# Patient Record
Sex: Male | Born: 1935 | Race: White | Hispanic: No | Marital: Married | Smoking: Never smoker
Health system: Southern US, Community
[De-identification: ages and names within clinical notes are randomized; demographics above are authoritative.]

## PROBLEM LIST (undated history)

## (undated) DIAGNOSIS — J310 Chronic rhinitis: Secondary | ICD-10-CM

## (undated) DIAGNOSIS — R3915 Urgency of urination: Secondary | ICD-10-CM

## (undated) DIAGNOSIS — I639 Cerebral infarction, unspecified: Secondary | ICD-10-CM

## (undated) DIAGNOSIS — N401 Enlarged prostate with lower urinary tract symptoms: Secondary | ICD-10-CM

## (undated) DIAGNOSIS — I10 Essential (primary) hypertension: Secondary | ICD-10-CM

## (undated) DIAGNOSIS — I480 Paroxysmal atrial fibrillation: Secondary | ICD-10-CM

## (undated) DIAGNOSIS — M25569 Pain in unspecified knee: Secondary | ICD-10-CM

## (undated) DIAGNOSIS — G20 Parkinson's disease: Secondary | ICD-10-CM

## (undated) DIAGNOSIS — M5136 Other intervertebral disc degeneration, lumbar region: Secondary | ICD-10-CM

## (undated) HISTORY — PX: HERNIA REPAIR: SHX51

---

## 1999-06-09 ENCOUNTER — Ambulatory Visit: Admission: RE | Admit: 1999-06-09 | Discharge: 1999-06-09 | Payer: Self-pay

## 2016-08-08 ENCOUNTER — Encounter (HOSPITAL_COMMUNITY): Payer: Self-pay

## 2016-08-08 ENCOUNTER — Emergency Department (HOSPITAL_COMMUNITY): Payer: Medicare Other

## 2016-08-08 ENCOUNTER — Inpatient Hospital Stay (HOSPITAL_COMMUNITY)
Admission: EM | Admit: 2016-08-08 | Discharge: 2016-08-12 | DRG: 064 | Disposition: A | Discharge status: Rehabilitation Facility | Payer: Medicare Other | Attending: Neurology | Admitting: Neurology

## 2016-08-08 DIAGNOSIS — R410 Disorientation, unspecified: Secondary | ICD-10-CM | POA: Diagnosis not present

## 2016-08-08 DIAGNOSIS — D72829 Elevated white blood cell count, unspecified: Secondary | ICD-10-CM | POA: Diagnosis present

## 2016-08-08 DIAGNOSIS — I161 Hypertensive emergency: Secondary | ICD-10-CM | POA: Diagnosis present

## 2016-08-08 DIAGNOSIS — B9620 Unspecified Escherichia coli [E. coli] as the cause of diseases classified elsewhere: Secondary | ICD-10-CM | POA: Diagnosis not present

## 2016-08-08 DIAGNOSIS — G8194 Hemiplegia, unspecified affecting left nondominant side: Secondary | ICD-10-CM | POA: Diagnosis present

## 2016-08-08 DIAGNOSIS — R29810 Facial weakness: Secondary | ICD-10-CM | POA: Diagnosis present

## 2016-08-08 DIAGNOSIS — I351 Nonrheumatic aortic (valve) insufficiency: Secondary | ICD-10-CM | POA: Diagnosis not present

## 2016-08-08 DIAGNOSIS — I69122 Dysarthria following nontraumatic intracerebral hemorrhage: Secondary | ICD-10-CM | POA: Diagnosis not present

## 2016-08-08 DIAGNOSIS — Z8673 Personal history of transient ischemic attack (TIA), and cerebral infarction without residual deficits: Secondary | ICD-10-CM | POA: Diagnosis not present

## 2016-08-08 DIAGNOSIS — Z23 Encounter for immunization: Secondary | ICD-10-CM

## 2016-08-08 DIAGNOSIS — M1710 Unilateral primary osteoarthritis, unspecified knee: Secondary | ICD-10-CM | POA: Diagnosis present

## 2016-08-08 DIAGNOSIS — M179 Osteoarthritis of knee, unspecified: Secondary | ICD-10-CM | POA: Diagnosis not present

## 2016-08-08 DIAGNOSIS — I480 Paroxysmal atrial fibrillation: Secondary | ICD-10-CM | POA: Diagnosis present

## 2016-08-08 DIAGNOSIS — I619 Nontraumatic intracerebral hemorrhage, unspecified: Secondary | ICD-10-CM | POA: Diagnosis not present

## 2016-08-08 DIAGNOSIS — R29703 NIHSS score 3: Secondary | ICD-10-CM | POA: Diagnosis present

## 2016-08-08 DIAGNOSIS — I7389 Other specified peripheral vascular diseases: Secondary | ICD-10-CM | POA: Diagnosis present

## 2016-08-08 DIAGNOSIS — I10 Essential (primary) hypertension: Secondary | ICD-10-CM | POA: Diagnosis not present

## 2016-08-08 DIAGNOSIS — G20 Parkinson's disease: Secondary | ICD-10-CM | POA: Diagnosis present

## 2016-08-08 DIAGNOSIS — R339 Retention of urine, unspecified: Secondary | ICD-10-CM | POA: Diagnosis not present

## 2016-08-08 DIAGNOSIS — D72825 Bandemia: Secondary | ICD-10-CM | POA: Diagnosis not present

## 2016-08-08 DIAGNOSIS — N179 Acute kidney failure, unspecified: Secondary | ICD-10-CM | POA: Diagnosis present

## 2016-08-08 DIAGNOSIS — I639 Cerebral infarction, unspecified: Secondary | ICD-10-CM | POA: Diagnosis present

## 2016-08-08 DIAGNOSIS — N189 Chronic kidney disease, unspecified: Secondary | ICD-10-CM | POA: Diagnosis not present

## 2016-08-08 DIAGNOSIS — R7989 Other specified abnormal findings of blood chemistry: Secondary | ICD-10-CM | POA: Diagnosis not present

## 2016-08-08 DIAGNOSIS — Z79899 Other long term (current) drug therapy: Secondary | ICD-10-CM

## 2016-08-08 DIAGNOSIS — I69119 Unspecified symptoms and signs involving cognitive functions following nontraumatic intracerebral hemorrhage: Secondary | ICD-10-CM | POA: Diagnosis not present

## 2016-08-08 DIAGNOSIS — I613 Nontraumatic intracerebral hemorrhage in brain stem: Principal | ICD-10-CM | POA: Diagnosis present

## 2016-08-08 DIAGNOSIS — Z800 Family history of malignant neoplasm of digestive organs: Secondary | ICD-10-CM | POA: Diagnosis not present

## 2016-08-08 DIAGNOSIS — I69398 Other sequelae of cerebral infarction: Secondary | ICD-10-CM | POA: Diagnosis not present

## 2016-08-08 DIAGNOSIS — N401 Enlarged prostate with lower urinary tract symptoms: Secondary | ICD-10-CM | POA: Diagnosis not present

## 2016-08-08 DIAGNOSIS — N183 Chronic kidney disease, stage 3 (moderate): Secondary | ICD-10-CM | POA: Diagnosis present

## 2016-08-08 DIAGNOSIS — R269 Unspecified abnormalities of gait and mobility: Secondary | ICD-10-CM | POA: Diagnosis not present

## 2016-08-08 DIAGNOSIS — H510 Palsy (spasm) of conjugate gaze: Secondary | ICD-10-CM | POA: Diagnosis present

## 2016-08-08 DIAGNOSIS — Z7982 Long term (current) use of aspirin: Secondary | ICD-10-CM

## 2016-08-08 DIAGNOSIS — N390 Urinary tract infection, site not specified: Secondary | ICD-10-CM | POA: Diagnosis not present

## 2016-08-08 DIAGNOSIS — E785 Hyperlipidemia, unspecified: Secondary | ICD-10-CM | POA: Diagnosis present

## 2016-08-08 DIAGNOSIS — I129 Hypertensive chronic kidney disease with stage 1 through stage 4 chronic kidney disease, or unspecified chronic kidney disease: Secondary | ICD-10-CM | POA: Diagnosis present

## 2016-08-08 DIAGNOSIS — I69391 Dysphagia following cerebral infarction: Secondary | ICD-10-CM | POA: Diagnosis not present

## 2016-08-08 DIAGNOSIS — Z87891 Personal history of nicotine dependence: Secondary | ICD-10-CM

## 2016-08-08 DIAGNOSIS — M1711 Unilateral primary osteoarthritis, right knee: Secondary | ICD-10-CM | POA: Diagnosis not present

## 2016-08-08 DIAGNOSIS — I69152 Hemiplegia and hemiparesis following nontraumatic intracerebral hemorrhage affecting left dominant side: Secondary | ICD-10-CM | POA: Diagnosis present

## 2016-08-08 DIAGNOSIS — R3916 Straining to void: Secondary | ICD-10-CM | POA: Diagnosis not present

## 2016-08-08 DIAGNOSIS — G9389 Other specified disorders of brain: Secondary | ICD-10-CM | POA: Diagnosis present

## 2016-08-08 DIAGNOSIS — R471 Dysarthria and anarthria: Secondary | ICD-10-CM | POA: Diagnosis present

## 2016-08-08 DIAGNOSIS — L309 Dermatitis, unspecified: Secondary | ICD-10-CM | POA: Diagnosis not present

## 2016-08-08 DIAGNOSIS — E876 Hypokalemia: Secondary | ICD-10-CM | POA: Diagnosis not present

## 2016-08-08 DIAGNOSIS — A499 Bacterial infection, unspecified: Secondary | ICD-10-CM | POA: Diagnosis not present

## 2016-08-08 DIAGNOSIS — N3000 Acute cystitis without hematuria: Secondary | ICD-10-CM | POA: Diagnosis not present

## 2016-08-08 DIAGNOSIS — H4921 Sixth [abducent] nerve palsy, right eye: Secondary | ICD-10-CM | POA: Diagnosis not present

## 2016-08-08 HISTORY — DX: Paroxysmal atrial fibrillation: I48.0

## 2016-08-08 HISTORY — DX: Pain in unspecified knee: M25.569

## 2016-08-08 HISTORY — DX: Other intervertebral disc degeneration, lumbar region: M51.36

## 2016-08-08 HISTORY — DX: Chronic rhinitis: J31.0

## 2016-08-08 HISTORY — DX: Cerebral infarction, unspecified: I63.9

## 2016-08-08 HISTORY — DX: Benign prostatic hyperplasia with lower urinary tract symptoms: N40.1

## 2016-08-08 HISTORY — DX: Urgency of urination: R39.15

## 2016-08-08 HISTORY — DX: Parkinson's disease: G20

## 2016-08-08 HISTORY — DX: Essential (primary) hypertension: I10

## 2016-08-08 LAB — URINALYSIS, ROUTINE W REFLEX MICROSCOPIC
Bilirubin Urine: NEGATIVE
Glucose, UA: NEGATIVE mg/dL
HGB URINE DIPSTICK: NEGATIVE
Ketones, ur: NEGATIVE mg/dL
LEUKOCYTES UA: NEGATIVE
Nitrite: NEGATIVE
PROTEIN: NEGATIVE mg/dL
Specific Gravity, Urine: 1.016 (ref 1.005–1.030)
pH: 6 (ref 5.0–8.0)

## 2016-08-08 LAB — DIFFERENTIAL
BASOS ABS: 0 10*3/uL (ref 0.0–0.1)
Basophils Relative: 0 %
Eosinophils Absolute: 0.1 10*3/uL (ref 0.0–0.7)
Eosinophils Relative: 0 %
LYMPHS ABS: 1.3 10*3/uL (ref 0.7–4.0)
Lymphocytes Relative: 9 %
MONOS PCT: 5 %
Monocytes Absolute: 0.8 10*3/uL (ref 0.1–1.0)
NEUTROS ABS: 12.6 10*3/uL — AB (ref 1.7–7.7)
Neutrophils Relative %: 86 %

## 2016-08-08 LAB — COMPREHENSIVE METABOLIC PANEL
ALT: 16 U/L — AB (ref 17–63)
AST: 28 U/L (ref 15–41)
Albumin: 3.9 g/dL (ref 3.5–5.0)
Alkaline Phosphatase: 46 U/L (ref 38–126)
Anion gap: 7 (ref 5–15)
BILIRUBIN TOTAL: 0.6 mg/dL (ref 0.3–1.2)
BUN: 23 mg/dL — ABNORMAL HIGH (ref 6–20)
CALCIUM: 9.5 mg/dL (ref 8.9–10.3)
CO2: 27 mmol/L (ref 22–32)
CREATININE: 1.63 mg/dL — AB (ref 0.61–1.24)
Chloride: 104 mmol/L (ref 101–111)
GFR calc Af Amer: 43 mL/min — ABNORMAL LOW (ref >60)
GFR calc non Af Amer: 37 mL/min — ABNORMAL LOW (ref >60)
Glucose, Bld: 124 mg/dL — ABNORMAL HIGH (ref 65–99)
Potassium: 3.9 mmol/L (ref 3.5–5.1)
Sodium: 138 mmol/L (ref 135–145)
TOTAL PROTEIN: 7.2 g/dL (ref 6.5–8.1)

## 2016-08-08 LAB — I-STAT CHEM 8, ED
BUN: 27 mg/dL — ABNORMAL HIGH (ref 6–20)
CALCIUM ION: 1.1 mmol/L — AB (ref 1.15–1.40)
CREATININE: 1.7 mg/dL — AB (ref 0.61–1.24)
Chloride: 102 mmol/L (ref 101–111)
GLUCOSE: 119 mg/dL — AB (ref 65–99)
HCT: 44 % (ref 39.0–52.0)
Hemoglobin: 15 g/dL (ref 13.0–17.0)
POTASSIUM: 3.8 mmol/L (ref 3.5–5.1)
Sodium: 140 mmol/L (ref 135–145)
TCO2: 27 mmol/L (ref 0–100)

## 2016-08-08 LAB — I-STAT TROPONIN, ED: TROPONIN I, POC: 0 ng/mL (ref 0.00–0.08)

## 2016-08-08 LAB — RAPID URINE DRUG SCREEN, HOSP PERFORMED
Amphetamines: NOT DETECTED
BARBITURATES: NOT DETECTED
Benzodiazepines: NOT DETECTED
Cocaine: NOT DETECTED
Opiates: NOT DETECTED
Tetrahydrocannabinol: NOT DETECTED

## 2016-08-08 LAB — ETHANOL: Alcohol, Ethyl (B): <5 mg/dL (ref <5)

## 2016-08-08 LAB — CBC
HEMATOCRIT: 43.4 % (ref 39.0–52.0)
HEMOGLOBIN: 15 g/dL (ref 13.0–17.0)
MCH: 32.1 pg (ref 26.0–34.0)
MCHC: 34.6 g/dL (ref 30.0–36.0)
MCV: 92.7 fL (ref 78.0–100.0)
Platelets: 173 10*3/uL (ref 150–400)
RBC: 4.68 MIL/uL (ref 4.22–5.81)
RDW: 14.2 % (ref 11.5–15.5)
WBC: 14.8 10*3/uL — ABNORMAL HIGH (ref 4.0–10.5)

## 2016-08-08 LAB — PROTIME-INR
INR: 1.11
Prothrombin Time: 14.3 seconds (ref 11.4–15.2)

## 2016-08-08 LAB — APTT: APTT: 29 s (ref 24–36)

## 2016-08-08 MED ORDER — STROKE: EARLY STAGES OF RECOVERY BOOK
Freq: Once | Status: AC
  Administered 2016-08-08: 22:00:00
  Filled 2016-08-08: qty 1

## 2016-08-08 MED ORDER — LABETALOL HCL 5 MG/ML IV SOLN
INTRAVENOUS | Status: AC
  Filled 2016-08-08: qty 4

## 2016-08-08 MED ORDER — SENNOSIDES-DOCUSATE SODIUM 8.6-50 MG PO TABS
1.0000 | ORAL_TABLET | Freq: Two times a day (BID) | ORAL | Status: DC
  Administered 2016-08-08 – 2016-08-12 (×7): 1 via ORAL
  Filled 2016-08-08 (×7): qty 1

## 2016-08-08 MED ORDER — LABETALOL HCL 5 MG/ML IV SOLN
10.0000 mg | Freq: Once | INTRAVENOUS | Status: AC
  Administered 2016-08-08: 10 mg via INTRAVENOUS

## 2016-08-08 MED ORDER — NICARDIPINE HCL IN NACL 20-0.86 MG/200ML-% IV SOLN
3.0000 mg/h | INTRAVENOUS | Status: DC
Start: 2016-08-08 — End: 2016-08-09
  Administered 2016-08-08 (×2): 5 mg/h via INTRAVENOUS
  Administered 2016-08-08: 3 mg/h via INTRAVENOUS
  Filled 2016-08-08 (×2): qty 200

## 2016-08-08 MED ORDER — ACETAMINOPHEN 160 MG/5ML PO SOLN: 650.0000 mg | ORAL | Status: DC | PRN

## 2016-08-08 MED ORDER — PANTOPRAZOLE SODIUM 40 MG IV SOLR
40.0000 mg | Freq: Every day | INTRAVENOUS | Status: DC
  Administered 2016-08-08 – 2016-08-09 (×2): 40 mg via INTRAVENOUS
  Filled 2016-08-08 (×2): qty 40

## 2016-08-08 MED ORDER — SODIUM CHLORIDE 0.9 % IV SOLN
INTRAVENOUS | Status: DC
  Administered 2016-08-08 – 2016-08-10 (×3): via INTRAVENOUS

## 2016-08-08 MED ORDER — ACETAMINOPHEN 650 MG RE SUPP: 650.0000 mg | RECTAL | Status: DC | PRN

## 2016-08-08 MED ORDER — ACETAMINOPHEN 325 MG PO TABS
650.0000 mg | ORAL_TABLET | ORAL | Status: DC | PRN
  Administered 2016-08-12: 650 mg via ORAL
  Filled 2016-08-08: qty 2

## 2016-08-08 NOTE — ED Notes (Signed)
Placed patient on the monitor did ekg shown to Dr Clayborne DanaMesner patient is now gone to the CT scan with nurse

## 2016-08-08 NOTE — Code Documentation (Signed)
83 y.o. 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 Pt awoke this morning in his usual state of health. His wife states she saw him normal at 1500. He then went into the bathroom and shut the door. His wife was concerned and went to check on him around 1520. The wife states once she arrived the patient asked for a walker and noted the pt to have a left sided gaze and was leaning to the left. [facility]. Code stroke called by EDP upon pt arrival. Pt taken to CT and labs drawn. CT showing acute pontine hemorrhage. Pt brought back to ED. 10mg  labetalol given and then Cardene gtt started for BP control. NIHSS 3. See EMR for code stroke times and NIHSS. On assessment pt with left gaze deviation and mild facial droop. tPA / IR not considered d/t hemorrhage. Bedside handoff with ED RN Amil AmenJulia

## 2016-08-08 NOTE — ED Notes (Addendum)
Diet order obtained from Lindzen, MD. Pt given sandwich meal and black coffee, per request.

## 2016-08-08 NOTE — ED Triage Notes (Signed)
Pt presents with sudden onset of weakness, unable to ambulate, noted with L sided gaze with wife reporting "leaning to the left".

## 2016-08-08 NOTE — H&P (Signed)
Neurology H&P  CC: Gaze palsy  History is obtained from:patient, wife  HPI: Para Skeanslbert Cameron is a 83 y.o. male last seen well around 3:20pm. We developed a left gaze deviation. Due to this continuing, he presented to the emergency room. He states that his left side feels slightly weaker than typical, but otherwise no associated symptoms.  Of note, he has a history of and possible hemorrhage in the past.  LKW: 3:20 PM tpa given?: no, ICH ICH Score: 2  ROS: A 14 point ROS was performed and is negative except as noted in the HPI.    Past Medical History:  Diagnosis Date  . Hypertension   . Stroke [facility](HCC)      History reviewed. No pertinent family history.   Social History:  reports that he has never smoked. He has quit using smokeless tobacco. His alcohol and drug histories are not on file.   Exam: Current vital signs: BP 170/82   Pulse 75   Resp 16   Ht 5\' 10"  (1.778 m)   Wt 85.3 kg (188 lb)   SpO2 98%   BMI 26.98 kg/m  Vital signs in last 24 hours: Pulse Rate:  [75] 75 (03/01 1710) Resp:  [16] 16 (03/01 1710) BP: (170)/(82) 170/82 (03/01 1710) SpO2:  [98 %] 98 % (03/01 1710) Weight:  [85.3 kg (188 lb)] 85.3 kg (188 lb) (03/01 1710)  Physical Exam  Constitutional: Appears well-developed and well-nourished.  Psych: Affect appropriate to situation Eyes: No scleral injection HENT: No OP obstrucion Head: Normocephalic.  Cardiovascular: Normal rate and regular rhythm.  Respiratory: Effort normal and breath sounds normal to anterior ascultation GI: Soft.  No distension. There is no tenderness.  Skin: WDI  Neuro: Mental Status: Patient is awake, alert, oriented to person, place, month, year, and situation. Patient is able to give a clear and coherent history. No signs of aphasia or neglect Cranial Nerves: II: Visual Fields are full. Pupils are equal, round, and reactive to light.   III,IV, VI: he has a left gaze deviation.  V: Facial sensation is symmetric to  temperature VII: Facial movement is mild left droop VIII: hearing is intact to voice X: Uvula elevates symmetrically XI: Shoulder shrug is symmetric. XII: tongue is midline without atrophy or fasciculations.  Motor: Tone is normal. Bulk is normal. 5/5 strength was present on the right, he has a very mild 5-/5 hemiparesis.  Sensory: Sensation is symmetric to light touch and temperature in the arms and legs. Cerebellar: FNF intact bilaterally   I have reviewed labs in epic and the results pertinent to this consultation are: Creatinine mildly elevated  I have reviewed the images obtained:CT head- small pontine hemorrhage.   Impression: 83 yo M With small pontine hemorrhage. Suspect hypertensive in etiology.   Recommendations: 1) Admit to ICU 2) no antiplatelets or anticoagulants 3) blood pressure control with goal systolic 120 - 140 4) Frequent neuro checks 5) If symptoms worsen or there is decreased mental status, repeat stat head CT 6) PT,OT,ST 7) Hydration for possible AKI 8) Nicardipine for BP control.     This patient is critically ill and at significant risk of neurological worsening, death and care requires constant monitoring of vital signs, hemodynamics,respiratory and cardiac monitoring, neurological assessment, discussion with family, other specialists and medical decision making of high complexity. I spent 35 minutes of neurocritical care time  in the care of  this patient.  Ritta SlotMcNeill Ariq Khamis, MD [facility] [phone]  If 7pm- 7am, please page neurology on  call as listed in AMION. 08/08/2016  5:26 PM

## 2016-08-08 NOTE — ED Provider Notes (Signed)
[facility]-EMERGENCY DEPT Provider Note   CSN: [id number] Arrival date & time: 08/08/16  1646     History   Chief Complaint Chief Complaint  Patient presents with  . Code Stroke    HPI Gabino Hagin is a 83 y.o. male.   Neurologic Problem  This is a new problem. The current episode started 1 to 2 hours ago. The problem occurs constantly. The problem has not changed since onset.Pertinent negatives include no chest pain. Nothing aggravates the symptoms. Nothing relieves the symptoms. He has tried nothing for the symptoms.    Past Medical History:  Diagnosis Date  . Hypertension   . Stroke [facility]     Patient Active Problem List   Diagnosis Date Noted  . Stroke (cerebrum) (HCC) 08/08/2016    History reviewed. No pertinent surgical history.     Home Medications    Prior to Admission medications   Medication Sig Start Date End Date Taking? Authorizing Provider  aspirin EC 325 MG tablet Take 325 mg by mouth daily.   Yes Historical Provider, MD  bisoprolol-hydrochlorothiazide (ZIAC) 5-6.25 MG tablet Take 1 tablet by mouth daily.   Yes Historical Provider, MD  docusate sodium (COLACE) 100 MG capsule Take 200 mg by mouth 2 (two) times daily.   Yes Historical Provider, MD    Family History History reviewed. No pertinent family history.  Social History Social History  Substance Use Topics  . Smoking status: Never Smoker  . Smokeless tobacco: Former Neurosurgeon  . Alcohol use Not on file     Allergies   Patient has no known allergies.   Review of Systems Review of Systems  Cardiovascular: Negative for chest pain.  Neurological: Positive for facial asymmetry (left eye deviation).  All other systems reviewed and are negative.    Physical Exam Updated Vital Signs BP (!) 122/54   Pulse 76   Temp 97.9 F (36.6 C)   Resp 17   Ht 5\' 10"  (1.778 m)   Wt 188 lb (85.3 kg)   SpO2 96%   BMI 26.98 kg/m   Physical Exam  Constitutional: He is oriented to person, place, and  time. He appears well-developed and well-nourished.  HENT:  Head: Normocephalic and atraumatic.  Eyes: Conjunctivae are normal.  Neck: Normal range of motion.  Cardiovascular: Normal rate.   Pulmonary/Chest: Effort normal. No respiratory distress.  Abdominal: Soft. He exhibits no distension.  Musculoskeletal: Normal range of motion.  Neurological: He is alert and oriented to person, place, and time. Coordination normal.  Persistent left gaze deviation and right neglect A&O X 4.  Able to move all extremities normally with normal sensation in all extremities.  Skin: Skin is warm and dry.  Nursing note and vitals reviewed.    ED Treatments / Results  Labs (all labs ordered are listed, but only abnormal results are displayed) Labs Reviewed  CBC - Abnormal; Notable for the following:       Result Value   WBC 14.8 (*)    All other components within normal limits  DIFFERENTIAL - Abnormal; Notable for the following:    Neutro Abs 12.6 (*)    All other components within normal limits  COMPREHENSIVE METABOLIC PANEL - Abnormal; Notable for the following:    Glucose, Bld 124 (*)    BUN 23 (*)    Creatinine, Ser 1.63 (*)    ALT 16 (*)    GFR calc non Af Amer 37 (*)    GFR calc Af Amer 43 (*)  All other components within normal limits  I-STAT CHEM 8, ED - Abnormal; Notable for the following:    BUN 27 (*)    Creatinine, Ser 1.70 (*)    Glucose, Bld 119 (*)    Calcium, Ion 1.10 (*)    All other components within normal limits  ETHANOL  PROTIME-INR  APTT  RAPID URINE DRUG SCREEN, HOSP PERFORMED  URINALYSIS, ROUTINE W REFLEX MICROSCOPIC  I-STAT TROPOININ, ED    EKG  EKG Interpretation  Date/Time:  Thursday August 08 2016 16:50:36 EST Ventricular Rate:  69 PR Interval:    QRS Duration: 112 QT Interval:  398 QTC Calculation: 427 R Axis:   82 Text Interpretation:  Atrial fibrillation Borderline intraventricular conduction delay Low voltage, precordial leads ST elevation,  consider lateral injury Baseline wander in lead(s) V6 No old tracing to compare Confirmed by [facility] MD, Barbara CowerJASON [phone]) on 08/08/2016 9:11:15 PM       Radiology Ct Head Code Stroke W/o Cm  Result Date: 08/08/2016 CLINICAL DATA:  Code stroke.  Right-sided weakness. EXAM: CT HEAD WITHOUT CONTRAST TECHNIQUE: Contiguous axial images were obtained from the base of the skull through the vertex without intravenous contrast. COMPARISON:  09/05/2015 FINDINGS: Brain: 11 x 10 x 18 mm high-density clot in the dorsal right para median upper pons, extending towards the midbrain. No neighboring low-density or mass effect. No other site of acute hemorrhage. Chronic ischemic injury with white matter gliosis, remote right lateral lenticulostriate/insular stroke, and right superior cerebellar infarcts. Generalized atrophy.  No hydrocephalus. Vascular: Atherosclerotic calcification. No suspected hyperdense vessel. Skull: No acute finding. Sinuses/Orbits: Mucous retention cyst on the floors of the maxillary sinuses. Chronic left sphenoid sinusitis. Bilateral cataract resection.  Gaze to the left. Other: Case discussed in person with Dr. Amada JupiterKirkpatrick 08/08/2016 at 5:08 pm . Aspects: Not scored in this setting IMPRESSION: 1. Acute pontine hemorrhage, 1-2 cc in volume. 2. Extensive chronic ischemic injury as described. Electronically Signed   By: Marnee SpringJonathon  Watts M.D.   On: 08/08/2016 17:14    Procedures Procedures (including critical care time)  CRITICAL CARE Performed by: Marily MemosMesner, Krystelle Prashad Total critical care time: 35 minutes Critical care time was exclusive of separately billable procedures and treating other patients. Critical care was necessary to treat or prevent imminent or life-threatening deterioration. Critical care was time spent personally by me on the following activities: development of treatment plan with patient and/or surrogate as well as nursing, discussions with consultants, evaluation of patient's response to  treatment, examination of patient, obtaining history from patient or surrogate, ordering and performing treatments and interventions, ordering and review of laboratory studies, ordering and review of radiographic studies, pulse oximetry and re-evaluation of patient's condition.   Medications Ordered in ED Medications  nicardipine (CARDENE) 20mg  in 0.86% saline 200ml IV infusion (0.1 mg/ml) (5 mg/hr Intravenous New Bag/Given 08/08/16 1941)  labetalol (NORMODYNE,TRANDATE) injection 10 mg (10 mg Intravenous Given 08/08/16 1725)     Initial Impression / Assessment and Plan / ED Course  I have reviewed the triage vital signs and the nursing notes.  Pertinent labs & imaging results that were available during my care of the patient were reviewed by me and considered in my medical decision making (see chart for details).   Patient arrived from EMS secondary to left gaze deviation. Reported EMS last known normal was around 1500. Immediate upon my evaluation a code stroke was initiated. Patient restricted to CT scan found have a small intracerebral hemorrhage. With her elevated blood pressures so dose of  labetalol and subsequent nicardipine drip was started. Multiple re-evaluations the patient without improvement of symptoms. No TPA given secondary to intracerebral hemorrhage. Neurology to admit to neuro-ICU.  Final Clinical Impressions(s) / ED Diagnoses   Final diagnoses:  Nontraumatic intracerebral hemorrhage, unspecified cerebral location, unspecified laterality (HCC)  Hypertension, unspecified type     Marily Memos, MD 08/08/16 2115

## 2016-08-09 ENCOUNTER — Inpatient Hospital Stay (HOSPITAL_COMMUNITY): Payer: Medicare Other

## 2016-08-09 ENCOUNTER — Encounter (HOSPITAL_COMMUNITY): Payer: Self-pay | Admitting: *Deleted

## 2016-08-09 DIAGNOSIS — I161 Hypertensive emergency: Secondary | ICD-10-CM | POA: Diagnosis present

## 2016-08-09 DIAGNOSIS — D72829 Elevated white blood cell count, unspecified: Secondary | ICD-10-CM

## 2016-08-09 DIAGNOSIS — I613 Nontraumatic intracerebral hemorrhage in brain stem: Principal | ICD-10-CM

## 2016-08-09 DIAGNOSIS — I10 Essential (primary) hypertension: Secondary | ICD-10-CM

## 2016-08-09 DIAGNOSIS — Z8673 Personal history of transient ischemic attack (TIA), and cerebral infarction without residual deficits: Secondary | ICD-10-CM

## 2016-08-09 DIAGNOSIS — I619 Nontraumatic intracerebral hemorrhage, unspecified: Secondary | ICD-10-CM

## 2016-08-09 DIAGNOSIS — G20 Parkinson's disease: Secondary | ICD-10-CM

## 2016-08-09 DIAGNOSIS — I351 Nonrheumatic aortic (valve) insufficiency: Secondary | ICD-10-CM

## 2016-08-09 DIAGNOSIS — M1711 Unilateral primary osteoarthritis, right knee: Secondary | ICD-10-CM | POA: Insufficient documentation

## 2016-08-09 DIAGNOSIS — I480 Paroxysmal atrial fibrillation: Secondary | ICD-10-CM

## 2016-08-09 DIAGNOSIS — N179 Acute kidney failure, unspecified: Secondary | ICD-10-CM | POA: Diagnosis present

## 2016-08-09 LAB — BASIC METABOLIC PANEL
ANION GAP: 11 (ref 5–15)
BUN: 19 mg/dL (ref 6–20)
CALCIUM: 9 mg/dL (ref 8.9–10.3)
CO2: 23 mmol/L (ref 22–32)
Chloride: 104 mmol/L (ref 101–111)
Creatinine, Ser: 1.45 mg/dL — ABNORMAL HIGH (ref 0.61–1.24)
GFR calc Af Amer: 50 mL/min — ABNORMAL LOW (ref >60)
GFR, EST NON AFRICAN AMERICAN: 43 mL/min — AB (ref >60)
GLUCOSE: 87 mg/dL (ref 65–99)
Potassium: 4 mmol/L (ref 3.5–5.1)
Sodium: 138 mmol/L (ref 135–145)

## 2016-08-09 LAB — LIPID PANEL
Cholesterol: 172 mg/dL (ref 0–200)
HDL: 35 mg/dL — AB (ref >40)
LDL Cholesterol: 113 mg/dL — ABNORMAL HIGH (ref 0–99)
Total CHOL/HDL Ratio: 4.9 RATIO
Triglycerides: 122 mg/dL (ref <150)
VLDL: 24 mg/dL (ref 0–40)

## 2016-08-09 LAB — CBC
HCT: 40 % (ref 39.0–52.0)
Hemoglobin: 13.8 g/dL (ref 13.0–17.0)
MCH: 31.7 pg (ref 26.0–34.0)
MCHC: 34.5 g/dL (ref 30.0–36.0)
MCV: 92 fL (ref 78.0–100.0)
PLATELETS: 166 10*3/uL (ref 150–400)
RBC: 4.35 MIL/uL (ref 4.22–5.81)
RDW: 13.8 % (ref 11.5–15.5)
WBC: 11.8 10*3/uL — AB (ref 4.0–10.5)

## 2016-08-09 LAB — MRSA PCR SCREENING: MRSA BY PCR: NEGATIVE

## 2016-08-09 LAB — TSH: TSH: 1.31 u[IU]/mL (ref 0.350–4.500)

## 2016-08-09 LAB — ECHOCARDIOGRAM COMPLETE
Height: 70 in
WEIGHTICAEL: 2874.8 [oz_av]

## 2016-08-09 MED ORDER — PNEUMOCOCCAL 13-VAL CONJ VACC IM SUSP
0.5000 mL | INTRAMUSCULAR | Status: AC
  Administered 2016-08-09: 0.5 mL via INTRAMUSCULAR
  Filled 2016-08-09 (×2): qty 0.5

## 2016-08-09 MED ORDER — LABETALOL HCL 5 MG/ML IV SOLN
10.0000 mg | INTRAVENOUS | Status: DC | PRN
  Administered 2016-08-09 – 2016-08-12 (×11): 10 mg via INTRAVENOUS
  Filled 2016-08-09 (×10): qty 4

## 2016-08-09 MED ORDER — BISOPROLOL FUMARATE 5 MG PO TABS
5.0000 mg | ORAL_TABLET | Freq: Every day | ORAL | Status: DC
  Administered 2016-08-09 – 2016-08-10 (×2): 5 mg via ORAL
  Filled 2016-08-09 (×4): qty 1

## 2016-08-09 NOTE — Progress Notes (Signed)
Inpatient Rehabilitation  Patient was screened by Breeann Reposa for appropriateness for an Inpatient Acute Rehab consult.  At this time we are recommending an Inpatient Rehab consult.  Please order if you are agreeable.    Antonin Meininger, M.A., CCC/SLP Admission Coordinator  [facility]  Cell [phone]  

## 2016-08-09 NOTE — Progress Notes (Addendum)
STROKE TEAM PROGRESS NOTE   HISTORY OF PRESENT ILLNESS (per record) [name] is a 83 y.o. male last seen well around 3:20pm on 08/08/2016. He developed a left gaze deviation. Due to this continuing, he presented to the emergency room. He states that his left side feels slightly weaker than typical, but otherwise no associated symptoms. Of note, he has a history of and possible hemorrhage in the past. CT shows a small pontine hemorrhage. ICH Score: 2.  He was admitted to the neuro ICU for further evaluation and treatment.   SUBJECTIVE (INTERVAL HISTORY) Met wife and son later in the afternoon. Pt still has left gaze deviation but motor function intact. CT repeat showed stable small pontine hemorrhage. He had right CR ICH in 1997 with left-sided hemi-paresis, gradually resolved over the years.   OBJECTIVE Temp:  [97.5 F (36.4 C)-97.9 F (36.6 C)] 97.5 F (36.4 C) (03/02 0400) Pulse Rate:  [60-92] 67 (03/02 1100) Cardiac Rhythm: Atrial fibrillation (03/02 0700) Resp:  [14-26] 18 (03/02 1100) BP: (104-170)/(54-117) 138/98 (03/02 1100) SpO2:  [94 %-100 %] 98 % (03/02 1100) FiO2 (%):  [0 %] 0 % (03/01 2144) Weight:  [81.5 kg (179 lb 10.8 oz)-85.3 kg (188 lb)] 81.5 kg (179 lb 10.8 oz) (03/02 0000)  CBC:  Recent Labs Lab 08/08/16 1700 08/08/16 1712 08/09/16 0543  WBC 14.8*  --  11.8*  NEUTROABS 12.6*  --   --   HGB 15.0 15.0 13.8  HCT 43.4 44.0 40.0  MCV 92.7  --  92.0  PLT 173  --  174    Basic Metabolic Panel:  Recent Labs Lab 08/08/16 1700 08/08/16 1712 08/09/16 0543  NA 138 140 138  K 3.9 3.8 4.0  CL 104 102 104  CO2 27  --  23  GLUCOSE 124* 119* 87  BUN 23* 27* 19  CREATININE 1.63* 1.70* 1.45*  CALCIUM 9.5  --  9.0    Lipid Panel:    Component Value Date/Time   CHOL 172 08/09/2016 0543   TRIG 122 08/09/2016 0543   HDL 35 (L) 08/09/2016 0543   CHOLHDL 4.9 08/09/2016 0543   VLDL 24 08/09/2016 0543   LDLCALC 113 (H) 08/09/2016 0543   HgbA1c: No results  found for: HGBA1C Urine Drug Screen:    Component Value Date/Time   LABOPIA NONE DETECTED 08/08/2016 1934   COCAINSCRNUR NONE DETECTED 08/08/2016 1934   LABBENZ NONE DETECTED 08/08/2016 1934   AMPHETMU NONE DETECTED 08/08/2016 1934   THCU NONE DETECTED 08/08/2016 1934   LABBARB NONE DETECTED 08/08/2016 1934      IMAGING I have personally reviewed the radiological images below and agree with the radiology interpretations.  Ct Head Code Stroke W/o Cm 08/08/2016 1. Acute pontine hemorrhage, 1-2 cc in volume. 2. Extensive chronic ischemic injury as described.   Ct Head Wo Contrast 08/09/2016 Evolving acute small pontine hemorrhage. Multiple old large territory and small vessel infarcts.   2-D echocardiogram Left ventricle: The cavity size was normal. There was mild   concentric hypertrophy. Systolic function was normal. The   estimated ejection fraction was in the range of 55% to 60%. Wall   motion was normal; there were no regional wall motion   abnormalities. - Aortic valve: There was trivial regurgitation. Valve area (VTI):   2.76 cm^2. Valve area (Vmax): 3.32 cm^2. Valve area (Vmean): 2.77   cm^2. - Mitral valve: Valve area by continuity equation (using LVOT   flow): 3.82 cm^2. - Left atrium: The atrium was mildly  dilated. - Right atrium: The atrium was mildly dilated.   PHYSICAL EXAM  Temp:  [97.5 F (36.4 C)-98.3 F (36.8 C)] 97.8 F (36.6 C) (03/02 1557) Pulse Rate:  [58-92] 62 (03/02 1557) Resp:  [14-26] 18 (03/02 1557) BP: (104-162)/(54-117) 160/73 (03/02 1557) SpO2:  [94 %-100 %] 98 % (03/02 1557) FiO2 (%):  [0 %] 0 % (03/01 2144) Weight:  [179 lb 10.8 oz (81.5 kg)] 179 lb 10.8 oz (81.5 kg) (03/02 0000)  General - Well nourished, well developed, in no apparent distress.  Ophthalmologic - Fundi not visualized due to eye movement.  Cardiovascular - Regular rate and rhythm.  Mental Status -  Level of arousal and orientation to time, place, and person were  intact. Language including expression, naming, repetition, comprehension was assessed and found intact, mild dysarthria  Cranial Nerves II - XII - II - Visual field intact OU. III, IV, VI - eye left gaze deviation, barely cross midline V - Facial sensation intact bilaterally. VII - mild left facial droop. VIII - Hearing & vestibular intact bilaterally. X - Palate elevates symmetrically, mild dysarthria. XI - Chin turning & shoulder shrug intact bilaterally. XII - Tongue protrusion intact.  Motor Strength - The patient's strength was normal in all extremities and pronator drift was absent.  Bulk was normal and fasciculations were absent.   Motor Tone - Muscle tone was assessed at the neck and appendages and was normal.  Reflexes - The patient's reflexes were 1+ in all extremities and he had no pathological reflexes.  Sensory - Light touch, temperature/pinprick were assessed and were symmetrical.    Coordination - The patient had normal movements in the hands with no ataxia or dysmetria.  Tremor was absent.  Gait and Station - deferred.   ASSESSMENT/PLAN [name] is a 83 y.o. male with history of HTN and previous stroke presenting with left gaze deviation.   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 Remote right CR encephalomalacia. And bilateral remote cerebellar infarcts.  Follow up CT evolving small pontine hemorrhage.    2D Echo  EF 55-60%  LDL 113  HgbA1c pending  SCDs for VTE prophylaxis  Diet Heart Room service appropriate? Yes; Fluid consistency: Thin  aspirin 325 mg daily prior to admission, no antithrombotic at this time due to [place].  Ongoing aggressive stroke risk factor management  Therapy recommendations:  Pending. Ok to be OOB  Disposition:  Transfer to floor   afib on EKG - ? New diagnosis  Not sure if  pt has previous diagnosis of afib  afib captured on tele and EKG  Not candidate for anticoagulation due to current ICH and hx of ICH  History of ICH as per patient and family  Right CR Urania with left-sided hemiparesis  No significant residual over the years  Hypertension  Stable  Initially treated with cardene, now off, BP remains stable  Current SBP goal < 140  On Ziac at home. Resume bisoprolol. Hold HCTZ for now given elevated Cr.  Long term BP goal normotensive  Hyperlipidemia  Home meds:  No statin  LDL 113, goal < 70  No need to initiate statin treatment at this time  Other Stroke Risk Factors  Advanced age  Hx of stroke - bilateral cerebellum remote lacunar infarct on MRI  Other Active Problems  CKD stage III, Cr 1.63->1.45  Leukocytosis  14.8->11.8. Afebrile. UA neg.   Hospital day # 1  Rosalin Hawking, MD PhD Stroke Neurology 08/09/2016 7:10 PM   To contact Stroke Continuity provider, please refer to [email or web address] After hours, contact General Neurology

## 2016-08-09 NOTE — Progress Notes (Signed)
PT Cancellation Note  Patient Details Name: [name] MRN: [id number] DOB: [birth year]   Cancelled Treatment:    Reason Eval/Treat Not Completed: Patient not medically ready Pt on bedrest. Will await increase in activity orders prior to PT evaluation.   Blake DivineShauna A Sultana Tierney 08/09/2016, 8:30 AM Mylo RedShauna Jandiel Magallanes, PT, DPT [phone]

## 2016-08-09 NOTE — Progress Notes (Signed)
Pt admitted to unit as transfer from [id number]. Pt A&O x4; IV intact and transfusing; telemetry applied and verified with CCMD: NT called to second verify. Pt oriented to the unit and room; fall/safety precaution and prevention education completed. Pt skin intact with no pressure ulcer or open wounds noted. VSS; Bed alarm on; call light within reach and will continue to monitor pt closely. Dionne BucyP. Amo Lakie Mclouth RN

## 2016-08-09 NOTE — Evaluation (Signed)
Physical Therapy Evaluation Patient Details Name: [name] MRN: [id number] DOB: 1930/10/11 Today's Date: 08/09/2016   History of Present Illness  Pt is an 83 y.o. male admitted 08/08/16 for new onset L facial droop and L gaze preference. CT showed acute pontine hemorrhage. PMH pertinent for HTN, HLD, and prior CVA with residual L-sided weakness (CT shows multiple chronic infarcts). NIHSS 3.   Clinical Impression  Pt presents to PT with right spatial inattention, left gaze preference, impaired balance, decreased tolerance to upright while standing, and impaired problem solving. PTA, pt indep with all functional mobility and ambulation, living with wife with family nearby. Today, pt able to stand and take a few steps to chair with maxA +2 and bilat HHA; pt with significant posterolateral lean to left; maxA to correct LOB x2. Limited ability to self-correct. Pt pleasant and motivated to work with PT. Pt would benefit from continued acute PT services to maximize functional mobility and independence.    Follow Up Recommendations CIR    Equipment Recommendations  None recommended by PT    Recommendations for Other Services Rehab consult     Precautions / Restrictions Precautions Precautions: Fall Restrictions Weight Bearing Restrictions: No      Mobility  Bed Mobility Overal bed mobility: Needs Assistance Bed Mobility: Supine to Sit     Supine to sit: Min assist     General bed mobility comments: Required increased time, verbal cues for sequencing, and minA for trunk support.  Transfers Overall transfer level: Needs assistance Equipment used: Rolling walker (2 wheeled);1 person hand held assist Transfers: Sit to/from Stand Sit to Stand: Min assist         General transfer comment: Sit-to-stand x1 reaching out for HHA for balance, requiring minA for balance; stood again with RW and minA. Pt with too much posterior lean in standing to accurately use RW.    Ambulation/Gait Ambulation/Gait assistance: +2 physical assistance;Max assist Ambulation Distance (Feet): 3 Feet Assistive device: 2 person hand held assist Gait Pattern/deviations: Step-to pattern;Leaning posteriorly;Narrow base of support;Decreased step length - right;Decreased step length - left Gait velocity: Decreased   General Gait Details: Amb forward/backward a few steps to chair with maxA +2 due to significant posterior/left lean which pt is unable to self-correct.   Stairs            Wheelchair Mobility    Modified Rankin (Stroke Patients Only) Modified Rankin (Stroke Patients Only) Pre-Morbid Rankin Score: No significant disability Modified Rankin: Moderately severe disability     Balance Overall balance assessment: Needs assistance Sitting-balance support: Bilateral upper extremity supported;Feet supported Sitting balance-Leahy Scale: Poor Sitting balance - Comments: Intermittent minA for trunk support to maintain balance; pt relient on RUE to maintain seated balance. Postural control: Posterior lean Standing balance support: Bilateral upper extremity supported;During functional activity Standing balance-Leahy Scale: Zero Standing balance comment: Decreased tolerance to standing; pt requiring maxA +2 with bilat HHA to maintain balance, unable to self-correct significant posterolateral lean towards left side. MaxA to correct 2x LOB.                              Pertinent Vitals/Pain Pain Assessment: No/denies pain    Home Living Family/patient expects to be discharged to:: Private residence Living Arrangements: Spouse/significant other Available Help at Discharge: Family;Available 24 hours/day Type of Home: House Home Access: Stairs to enter   Entergy CorporationEntrance Stairs-Number of Steps: 1 Home Layout: One level Home Equipment: Walker - 2 wheels  Additional Comments: Lives with wife who is indep and drives; reports she is have Lasix eye surgery today  (3/2). Sons live nearby.     Prior Function Level of Independence: Independent         Comments: Limited driving     Hand Dominance   Dominant Hand: Left    Extremity/Trunk Assessment   Upper Extremity Assessment Upper Extremity Assessment: Defer to OT evaluation    Lower Extremity Assessment Lower Extremity Assessment: Generalized weakness;LLE deficits/detail LLE Deficits / Details: 4/5 strength throughout all LLE  LLE Sensation:  (WNL)    Cervical / Trunk Assessment Cervical / Trunk Assessment: Kyphotic  Communication   Communication: Expressive difficulties (Slight slurred speech)  Cognition Arousal/Alertness: Awake/alert Behavior During Therapy: WFL for tasks assessed/performed Overall Cognitive Status: No family/caregiver present to determine baseline cognitive functioning Area of Impairment: Attention;Following commands;Safety/judgement;Awareness;Problem solving   Current Attention Level: Selective   Following Commands: Follows multi-step commands inconsistently;Follows one step commands inconsistently Safety/Judgement: Decreased awareness of safety Awareness: Emergent Problem Solving: Difficulty sequencing;Requires verbal cues;Requires tactile cues;Slow processing General Comments: Pt demonstrates awareness of visual deficits. Spatial inattention towards right side, only able to track just short of midline. Recognized my gait belt and recalled prior experience with PT with prior CVA.    General Comments      Exercises     Assessment/Plan    PT Assessment Patient needs continued PT services  PT Problem List Decreased strength;Decreased activity tolerance;Decreased balance;Decreased mobility;Decreased cognition;Decreased safety awareness       PT Treatment Interventions Gait training;Stair training;Functional mobility training;Neuromuscular re-education;Balance training;Therapeutic exercise;Therapeutic activities;Patient/family education    PT Goals  (Current goals can be found in the Care Plan section)  Acute Rehab PT Goals Patient Stated Goal: Return home PT Goal Formulation: With patient Time For Goal Achievement: 08/23/16 Potential to Achieve Goals: Good    Frequency Min 4X/week   Barriers to discharge        Co-evaluation               End of Session Equipment Utilized During Treatment: Gait belt Activity Tolerance: Patient tolerated treatment well Patient left: in chair;with call bell/phone within reach Nurse Communication: Mobility status PT Visit Diagnosis: Unsteadiness on feet (R26.81);Other abnormalities of gait and mobility (R26.89)         Time: 6962-9528 PT Time Calculation (min) (ACUTE ONLY): 26 min   Charges:   PT Evaluation $PT Eval Moderate Complexity: 1 Procedure PT Treatments $Therapeutic Activity: 8-22 mins   PT G Codes:       Dewayne Hatch, SPT Office-[phone]  Ina Homes 08/09/2016, 1:03 PM

## 2016-08-09 NOTE — Evaluation (Signed)
Occupational Therapy Evaluation Patient Details Name: [name] MRN: [id number] DOB: 1931-01-30 Today's Date: 08/09/2016    History of Present Illness Pt is an 83 y.o. male admitted 08/08/16 for new onset L facial droop and L gaze preference. CT showed acute pontine hemorrhage. PMH pertinent for HTN, HLD, and prior CVA with residual L-sided weakness (CT shows multiple chronic infarcts). NIHSS 3.    Clinical Impression   Pt admitted with above. He demonstrates the below listed deficits and will benefit from continued OT to maximize safety and independence with BADLs.  Pt presents to OT with Lt gaze preference and impaired occulomotor ROM, impaired balance, impaired cognition, generalized weakness, and decreased safety awareness.  He requires mod - max A for ADLs and mod A for functional transfers.  Feel he would benefit from CIR to maximize independence with ADLs and allow him to return home with assist from family       Follow Up Recommendations  CIR;Supervision/Assistance - 24 hour    Equipment Recommendations  Tub/shower bench    Recommendations for Other Services Rehab consult     Precautions / Restrictions Precautions Precautions: Fall      Mobility Bed Mobility Overal bed mobility: Needs Assistance Bed Mobility: Supine to Sit     Supine to sit: Min assist     General bed mobility comments: Pt moves impulsively.  Requires min A to guide him to EOB and prevent LOB   Transfers Overall transfer level: Needs assistance Equipment used: Rolling walker (2 wheeled);1 person hand held assist Transfers: Sit to/from Stand Sit to Stand: Min assist         General transfer comment: min A for balance and mod A to side step up EOB     Balance Overall balance assessment: Needs assistance Sitting-balance support: Feet supported;Bilateral upper extremity supported Sitting balance-Leahy Scale: Poor Sitting balance - Comments: reliant on UE support  Postural control:  Posterior lean Standing balance support: Bilateral upper extremity supported;During functional activity Standing balance-Leahy Scale: Poor Standing balance comment: Pt requires mod A to maintain standing balance.  Lt lateral and posterior  lean                            ADL Overall ADL's : Needs assistance/impaired Eating/Feeding: Set up;Bed level;Sitting   Grooming: Wash/dry face;Wash/dry hands;Oral care;Brushing hair;Minimal assistance;Sitting   Upper Body Bathing: Minimal assistance;Sitting   Lower Body Bathing: Moderate assistance;Sit to/from stand   Upper Body Dressing : Moderate assistance;Sitting   Lower Body Dressing: Maximal assistance;Sit to/from stand   Toilet Transfer: Moderate assistance;Stand-pivot;BSC   Toileting- Clothing Manipulation and Hygiene: Maximal assistance;Sit to/from stand       Functional mobility during ADLs: Moderate assistance General ADL Comments: pt requires assist for balance.  He is very impulsive      Vision Baseline Vision/History: Wears glasses Wears Glasses: Reading only Patient Visual Report: Diplopia;Other (comment) (eyes deviated to Lt ) Vision Assessment?: Yes Eye Alignment: Impaired (comment) Ocular Range of Motion: Restricted on the right Alignment/Gaze Preference: Head turned;Head tilt;Gaze left Tracking/Visual Pursuits: Right eye does not track laterally;Left eye does not track medially;Requires cues, head turns, or add eye shifts to track;Decreased smoothness of horizontal tracking Saccades: Other (comment) (impaired ) Convergence: Impaired (comment) Visual Fields: No apparent deficits Diplopia Assessment: Only with left gaze Additional Comments: Pt with Lt gaze preference with eyes deviated to Lt, head turned to left and and tilted to Lt.  He can individually track to midline  with each eye, but only able to track ~1/3 of Range with both eyes together.   He reports diplopia only with far Lt gaze      Perception  Perception Perception Tested?: Yes   Praxis Praxis Praxis tested?: Within functional limits    Pertinent Vitals/Pain Pain Assessment: No/denies pain     Hand Dominance Left   Extremity/Trunk Assessment Upper Extremity Assessment Upper Extremity Assessment: LUE deficits/detail LUE Deficits / Details: Pt with h/o mild Lt UE weakness from previous CVA.  He reports he is unable to write with Lt UE    Lower Extremity Assessment Lower Extremity Assessment: Defer to PT evaluation LLE Deficits / Details: 4/5 strength throughout all LLE  LLE Sensation:  (WNL)   Cervical / Trunk Assessment Cervical / Trunk Assessment: Kyphotic   Communication Communication Communication: Expressive difficulties (dysarthria )   Cognition Arousal/Alertness: Awake/alert Behavior During Therapy: WFL for tasks assessed/performed Overall Cognitive Status: Impaired/Different from baseline Area of Impairment: Attention;Awareness;Problem solving;Safety/judgement   Current Attention Level: Selective   Following Commands: Follows multi-step commands inconsistently Safety/Judgement: Decreased awareness of safety Awareness: Emergent Problem Solving: Difficulty sequencing;Requires verbal cues General Comments: Pt impulsive.  He requires min cues for sequencing    General Comments  VSS    Exercises       Shoulder Instructions      Home Living Family/patient expects to be discharged to:: Private residence Living Arrangements: Spouse/significant other Available Help at Discharge: Family;Available 24 hours/day Type of Home: House Home Access: Stairs to enter [employer] of Steps: 1   Home Layout: One level     Bathroom Shower/Tub: Tub/shower unit;Curtain   Firefighter: Standard     Home Equipment: Environmental consultant - 2 wheels   Additional Comments: Pt lives with wife, and one son.  the other son lives in an RV behind their house       Prior Functioning/Environment Level of Independence:  Independent        Comments: Pt reports he recently stopped driving due to decreaesed reflexes and not feeling safe         OT Problem List: Decreased strength;Decreased activity tolerance;Impaired balance (sitting and/or standing);Impaired vision/perception;Decreased coordination;Decreased cognition;Decreased safety awareness;Decreased knowledge of use of DME or AE;Impaired UE functional use      OT Treatment/Interventions: Self-care/ADL training;Therapeutic exercise;Neuromuscular education;DME and/or AE instruction;Therapeutic activities;Visual/perceptual remediation/compensation;Cognitive remediation/compensation;Patient/family education;Balance training    OT Goals(Current goals can be found in the care plan section) Acute Rehab OT Goals Patient Stated Goal: Return home OT Goal Formulation: With patient Time For Goal Achievement: 08/23/16 Potential to Achieve Goals: Good ADL Goals Pt Will Perform Grooming: with min assist;standing Pt Will Perform Upper Body Bathing: with supervision;sitting Pt Will Perform Lower Body Bathing: with min assist;sit to/from stand Pt Will Perform Upper Body Dressing: with supervision;sitting Pt Will Perform Lower Body Dressing: with min assist;sit to/from stand Pt Will Transfer to Toilet: with min assist;ambulating;bedside commode;grab bars;regular height toilet Additional ADL Goal #1: Pt will be independent with visual exercises  Additional ADL Goal #2: Pt will be able to visually locate items in rrom during ADLs   OT Frequency: Min 2X/week   Barriers to D/C:            Co-evaluation              End of Session Equipment Utilized During Treatment: Gait belt Nurse Communication: Mobility status  Activity Tolerance: Patient tolerated treatment well Patient left: in bed;with call bell/phone within reach;with bed alarm set  OT Visit Diagnosis:  Unsteadiness on feet (R26.81)                ADL either performed or assessed with clinical  judgement  Time: 4098-1191 OT Time Calculation (min): 29 min Charges:  OT General Charges $OT Visit: 1 Procedure OT Evaluation $OT Eval Moderate Complexity: 1 Procedure OT Treatments $Neuromuscular Re-education: 8-22 mins G-Codes:     Jeani Hawking, OTR/L [phone]   Jeani Hawking M 08/09/2016, 4:27 PM

## 2016-08-09 NOTE — Care Management Note (Signed)
Case Management Note  Patient Details  Name: [name] MRN: [id number] Date of Birth: [birth year]  Subjective/Objective:   Pt admitted on 08/08/16 with acute pontine hemorrhage.  PTA, pt resided at home with his spouse and two sons.                   Action/Plan: Met with pt and son to discuss dc plans.  Family able to provide care at discharge.  PT/OT recommending CIR.  Will need rehab consult.    Expected Discharge Date:                  Expected Discharge Plan:  [place]  In-House Referral:     Discharge planning Services  CM Consult  Post Acute Care Choice:    Choice offered to:     DME Arranged:    DME Agency:     HH Arranged:    [place] Agency:     Status of Service:  In process, will continue to follow  If discussed at Long Length of Stay Meetings, dates discussed:    Additional Comments:  Reinaldo Raddle, RN, BSN  Trauma/Neuro ICU Case Manager [phone]

## 2016-08-09 NOTE — Consult Note (Signed)
Physical Medicine and Rehabilitation Consult   Reason for Consult: Left sided weakness and left gaze deviation Referring Physician: Dr. Roda Shutters   HPI: [name] is a 83 y.o. male with history of HTN, PAF, CVA '97, Parkinson's disease, Knee OA,  who was admitted on 08/08/16 with left  sided weakness and left gaze preference. CT head reviewed, showing small right brainstem bleed. Per report, 11 X 10 X 18 mm hemorrhage in right pons and chronic ischemic injury with white matter gliosis and remote right lenticulostriate and right cerebellar strokes.  Stroke felt to be hypertensive in origin and follow up CCT stable. Workup on going per Neurology. PT evaluation done revealing significant posterior lean, right inattention with imbalance and cognitive deficits with deficits in problem solving. CIR recommended for follow up therapy.    Review of Systems  HENT: Positive for hearing loss. Negative for ear pain and tinnitus.   Eyes: Positive for blurred vision and double vision.  Respiratory: Negative for cough and hemoptysis.   Cardiovascular: Negative for chest pain, palpitations and leg swelling.  Gastrointestinal: Negative for heartburn and nausea.  Genitourinary: Negative for dysuria.  Musculoskeletal: Negative for back pain and myalgias.  Neurological: Positive for sensory change and focal weakness. Negative for dizziness, speech change and headaches.  Psychiatric/Behavioral: Negative for memory loss. The patient is nervous/anxious.   All other systems reviewed and are negative.     Past Medical History:  Diagnosis Date  . Hypertension   . Stroke [facility])     History reviewed. No past surgical history.    Family History  Problem Relation Age of Onset  . Stomach cancer Mother   . Cancer Father   . Cancer Sister       Social History:  Married. Retired--used to work for [employer]. Independent without AD. sons in town check in on them. He does not use tobacco, alcohol  or illicit drugs.    Allergies: No Known Allergies    Medications Prior to Admission  Medication Sig Dispense Refill  . aspirin EC 325 MG tablet Take 325 mg by mouth daily.    . bisoprolol-hydrochlorothiazide (ZIAC) 5-6.25 MG tablet Take 1 tablet by mouth daily.    Marland Kitchen docusate sodium (COLACE) 100 MG capsule Take 200 mg by mouth 2 (two) times daily.      Home: Home Living Family/patient expects to be discharged to:: Private residence Living Arrangements: Spouse/significant other Available Help at Discharge: Family, Available 24 hours/day Type of Home: House Home Access: Stairs to enter [employer] of Steps: 1 Home Layout: One level Home Equipment: Environmental consultant - 2 wheels Additional Comments: Lives with wife who is indep and drives; reports she is have Lasix eye surgery today (3/2). Sons live nearby.   Functional History: Prior Function Level of Independence: Independent Comments: Limited driving Functional Status:  Mobility: Bed Mobility Overal bed mobility: Needs Assistance Bed Mobility: Supine to Sit Supine to sit: Min assist General bed mobility comments: Required increased time, verbal cues for sequencing, and minA for trunk support. Transfers Overall transfer level: Needs assistance Equipment used: Rolling walker (2 wheeled), 1 person hand held assist Transfers: Sit to/from Stand Sit to Stand: Min assist General transfer comment: Sit-to-stand x1 reaching out for HHA for balance, requiring minA for balance; stood again with RW and minA. Pt with too much posterior lean in standing to accurately use RW.  Ambulation/Gait Ambulation/Gait assistance: +2 physical assistance, Max assist Ambulation Distance (Feet): 3 Feet Assistive device: 2 person hand  held assist Gait Pattern/deviations: Step-to pattern, Leaning posteriorly, Narrow base of support, Decreased step length - right, Decreased step length - left General Gait Details: Amb forward/backward a few steps to chair  with maxA +2 due to significant posterior/left lean which pt is unable to self-correct.  Gait velocity: Decreased    ADL:    Cognition: Cognition Overall Cognitive Status: No family/caregiver present to determine baseline cognitive functioning Orientation Level: Oriented X4 Cognition Arousal/Alertness: Awake/alert Behavior During Therapy: WFL for tasks assessed/performed Overall Cognitive Status: No family/caregiver present to determine baseline cognitive functioning Area of Impairment: Attention, Following commands, Safety/judgement, Awareness, Problem solving Current Attention Level: Selective Following Commands: Follows multi-step commands inconsistently, Follows one step commands inconsistently Safety/Judgement: Decreased awareness of safety Awareness: Emergent Problem Solving: Difficulty sequencing, Requires verbal cues, Requires tactile cues, Slow processing General Comments: Pt demonstrates awareness of visual deficits. Spatial inattention towards right side, only able to track just short of midline. Recognized my gait belt and recalled prior experience with PT with prior CVA.  Blood pressure (!) 151/92, pulse 67, temperature 97.8 F (36.6 C), temperature source Oral, resp. rate 15, height 5\' 10"  (1.778 m), weight 81.5 kg (179 lb 10.8 oz), SpO2 100 %. Physical Exam  Nursing note and vitals reviewed. Constitutional: He is oriented to person, place, and time. He appears well-developed and well-nourished.  Elderly male with left gaze preference and slumped to the left.   HENT:  Head: Normocephalic and atraumatic.  Mouth/Throat: Oropharynx is clear and moist.  Eyes: Conjunctivae are normal. Pupils are equal, round, and reactive to light.  Eyes fixed to left--able to move left eye to midline with cues  Neck: Normal range of motion. Neck supple.  Cardiovascular: Normal rate and regular rhythm.   Murmur heard. Respiratory: Effort normal and breath sounds normal.  GI: Soft. Bowel  sounds are normal. He exhibits no distension. There is no tenderness.  Musculoskeletal: He exhibits no edema or tenderness.  Neurological: He is alert and oriented to person, place, and time.  Soft speech.  Left facial weakness with gaze fixed to the left.  Sensation diminished to light touch right hemiface Motor: RUE/RLE: 5/5 proximal to distal LUE/LLE: 4+/5 proximal to distal DTRs symmetric Sensation intact to light touch No ataxia  Skin: Skin is warm and dry. No erythema.  Psychiatric: His affect is blunt. He is slowed.    Results for orders placed or performed during the hospital encounter of 08/08/16 (from the past 24 hour(s))  Ethanol     Status: None   Collection Time: 08/08/16  5:00 PM  Result Value Ref Range   Alcohol, Ethyl (B) <5 <5 mg/dL  Protime-INR     Status: None   Collection Time: 08/08/16  5:00 PM  Result Value Ref Range   Prothrombin Time 14.3 11.4 - 15.2 seconds   INR 1.11   APTT     Status: None   Collection Time: 08/08/16  5:00 PM  Result Value Ref Range   aPTT 29 24 - 36 seconds  CBC     Status: Abnormal   Collection Time: 08/08/16  5:00 PM  Result Value Ref Range   WBC 14.8 (H) 4.0 - 10.5 K/uL   RBC 4.68 4.22 - 5.81 MIL/uL   Hemoglobin 15.0 13.0 - 17.0 g/dL   HCT 29.5 62.1 - 30.8 %   MCV 92.7 78.0 - 100.0 fL   MCH 32.1 26.0 - 34.0 pg   MCHC 34.6 30.0 - 36.0 g/dL   RDW 65.7 84.6 -  15.5 %   Platelets 173 150 - 400 K/uL  Differential     Status: Abnormal   Collection Time: 08/08/16  5:00 PM  Result Value Ref Range   Neutrophils Relative % 86 %   Neutro Abs 12.6 (H) 1.7 - 7.7 K/uL   Lymphocytes Relative 9 %   Lymphs Abs 1.3 0.7 - 4.0 K/uL   Monocytes Relative 5 %   Monocytes Absolute 0.8 0.1 - 1.0 K/uL   Eosinophils Relative 0 %   Eosinophils Absolute 0.1 0.0 - 0.7 K/uL   Basophils Relative 0 %   Basophils Absolute 0.0 0.0 - 0.1 K/uL  Comprehensive metabolic panel     Status: Abnormal   Collection Time: 08/08/16  5:00 PM  Result Value Ref  Range   Sodium 138 135 - 145 mmol/L   Potassium 3.9 3.5 - 5.1 mmol/L   Chloride 104 101 - 111 mmol/L   CO2 27 22 - 32 mmol/L   Glucose, Bld 124 (H) 65 - 99 mg/dL   BUN 23 (H) 6 - 20 mg/dL   Creatinine, Ser 1.61 (H) 0.61 - 1.24 mg/dL   Calcium 9.5 8.9 - 09.6 mg/dL   Total Protein 7.2 6.5 - 8.1 g/dL   Albumin 3.9 3.5 - 5.0 g/dL   AST 28 15 - 41 U/L   ALT 16 (L) 17 - 63 U/L   Alkaline Phosphatase 46 38 - 126 U/L   Total Bilirubin 0.6 0.3 - 1.2 mg/dL   GFR calc non Af Amer 37 (L) >60 mL/min   GFR calc Af Amer 43 (L) >60 mL/min   Anion gap 7 5 - 15  I-stat troponin, ED (not at [facility])     Status: None   Collection Time: 08/08/16  5:10 PM  Result Value Ref Range   Troponin i, poc 0.00 0.00 - 0.08 ng/mL   Comment 3          I-Stat Chem 8, ED  (not at [facility], [facility])     Status: Abnormal   Collection Time: 08/08/16  5:12 PM  Result Value Ref Range   Sodium 140 135 - 145 mmol/L   Potassium 3.8 3.5 - 5.1 mmol/L   Chloride 102 101 - 111 mmol/L   BUN 27 (H) 6 - 20 mg/dL   Creatinine, Ser 0.45 (H) 0.61 - 1.24 mg/dL   Glucose, Bld 409 (H) 65 - 99 mg/dL   Calcium, Ion 8.11 (L) 1.15 - 1.40 mmol/L   TCO2 27 0 - 100 mmol/L   Hemoglobin 15.0 13.0 - 17.0 g/dL   HCT 91.4 78.2 - 95.6 %  Urine rapid drug screen (hosp performed)not at [facility]     Status: None   Collection Time: 08/08/16  7:34 PM  Result Value Ref Range   Opiates NONE DETECTED NONE DETECTED   Cocaine NONE DETECTED NONE DETECTED   Benzodiazepines NONE DETECTED NONE DETECTED   Amphetamines NONE DETECTED NONE DETECTED   Tetrahydrocannabinol NONE DETECTED NONE DETECTED   Barbiturates NONE DETECTED NONE DETECTED  Urinalysis, Routine w reflex microscopic     Status: None   Collection Time: 08/08/16  7:34 PM  Result Value Ref Range   Color, Urine YELLOW YELLOW   APPearance CLEAR CLEAR   Specific Gravity, Urine 1.016 1.005 - 1.030   pH 6.0 5.0 - 8.0   Glucose, UA NEGATIVE NEGATIVE mg/dL   Hgb urine dipstick NEGATIVE NEGATIVE    Bilirubin Urine NEGATIVE NEGATIVE   Ketones, ur NEGATIVE NEGATIVE mg/dL   Protein,  ur NEGATIVE NEGATIVE mg/dL   Nitrite NEGATIVE NEGATIVE   Leukocytes, UA NEGATIVE NEGATIVE  MRSA PCR Screening     Status: None   Collection Time: 08/09/16 12:11 AM  Result Value Ref Range   MRSA by PCR NEGATIVE NEGATIVE  CBC     Status: Abnormal   Collection Time: 08/09/16  5:43 AM  Result Value Ref Range   WBC 11.8 (H) 4.0 - 10.5 K/uL   RBC 4.35 4.22 - 5.81 MIL/uL   Hemoglobin 13.8 13.0 - 17.0 g/dL   HCT 16.1 09.6 - 04.5 %   MCV 92.0 78.0 - 100.0 fL   MCH 31.7 26.0 - 34.0 pg   MCHC 34.5 30.0 - 36.0 g/dL   RDW 40.9 81.1 - 91.4 %   Platelets 166 150 - 400 K/uL  Basic metabolic panel     Status: Abnormal   Collection Time: 08/09/16  5:43 AM  Result Value Ref Range   Sodium 138 135 - 145 mmol/L   Potassium 4.0 3.5 - 5.1 mmol/L   Chloride 104 101 - 111 mmol/L   CO2 23 22 - 32 mmol/L   Glucose, Bld 87 65 - 99 mg/dL   BUN 19 6 - 20 mg/dL   Creatinine, Ser 7.82 (H) 0.61 - 1.24 mg/dL   Calcium 9.0 8.9 - 95.6 mg/dL   GFR calc non Af Amer 43 (L) >60 mL/min   GFR calc Af Amer 50 (L) >60 mL/min   Anion gap 11 5 - 15  Lipid panel     Status: Abnormal   Collection Time: 08/09/16  5:43 AM  Result Value Ref Range   Cholesterol 172 0 - 200 mg/dL   Triglycerides 213 <086 mg/dL   HDL 35 (L) >57 mg/dL   Total CHOL/HDL Ratio 4.9 RATIO   VLDL 24 0 - 40 mg/dL   LDL Cholesterol 846 (H) 0 - 99 mg/dL  TSH     Status: None   Collection Time: 08/09/16  5:43 AM  Result Value Ref Range   TSH 1.310 0.350 - 4.500 uIU/mL   Ct Head Wo Contrast  Result Date: 08/09/2016 CLINICAL DATA:  Follow-up intracranial hemorrhage. EXAM: CT HEAD WITHOUT CONTRAST TECHNIQUE: Contiguous axial images were obtained from the base of the skull through the vertex without intravenous contrast. COMPARISON:  CT HEAD August 08, 2016 FINDINGS: BRAIN: 8 x 12 mm RIGHT midbrain/pontine hemorrhage is relatively unchanged, no significant edema. RIGHT  frontal/basal ganglia encephalomalacia with ex vacuo dilatation RIGHT lateral ventricle. Old RIGHT thalamus infarct. Old bilateral cerebellar infarcts. Old LEFT basal ganglia lacunar infarcts. Patchy to confluent supratentorial white matter hypodensities exclusive of the aforementioned abnormality compatible with moderate chronic small vessel ischemic disease. No acute large vascular territory infarct. No midline shift or mass effect. No abnormal extra-axial fluid collections. Basal cisterns are patent. VASCULAR: Moderate to severe calcific atherosclerosis of the carotid siphons. SKULL: No skull fracture. No significant scalp soft tissue swelling. SINUSES/ORBITS: Severe chronic LEFT sphenoid sinusitis. Scattered mucosal retention cyst. Imaged mastoid air cells are well aerated. Status post bilateral ocular lens implants. The included ocular globes and orbital contents are non-suspicious. OTHER: None. IMPRESSION: Evolving acute small pontine hemorrhage. Multiple old large territory and small vessel infarcts. Electronically Signed   By: Awilda Metro M.D.   On: 08/09/2016 05:04   Ct Head Code Stroke W/o Cm  Result Date: 08/08/2016 CLINICAL DATA:  Code stroke.  Right-sided weakness. EXAM: CT HEAD WITHOUT CONTRAST TECHNIQUE: Contiguous axial images were obtained from the base of the  skull through the vertex without intravenous contrast. COMPARISON:  09/05/2015 FINDINGS: Brain: 11 x 10 x 18 mm high-density clot in the dorsal right para median upper pons, extending towards the midbrain. No neighboring low-density or mass effect. No other site of acute hemorrhage. Chronic ischemic injury with white matter gliosis, remote right lateral lenticulostriate/insular stroke, and right superior cerebellar infarcts. Generalized atrophy.  No hydrocephalus. Vascular: Atherosclerotic calcification. No suspected hyperdense vessel. Skull: No acute finding. Sinuses/Orbits: Mucous retention cyst on the floors of the maxillary  sinuses. Chronic left sphenoid sinusitis. Bilateral cataract resection.  Gaze to the left. Other: Case discussed in person with Dr. Amada JupiterKirkpatrick 08/08/2016 at 5:08 pm . Aspects: Not scored in this setting IMPRESSION: 1. Acute pontine hemorrhage, 1-2 cc in volume. 2. Extensive chronic ischemic injury as described. Electronically Signed   By: Marnee SpringJonathon  Watts M.D.   On: 08/08/2016 17:14    Assessment/Plan: Diagnosis: right pons hemorrhage Labs and images independently reviewed.  Records reviewed and summated above. Stroke: Continue secondary stroke prophylaxis and Risk Factor Modification listed below:   Antiplatelet therapy:   Blood Pressure Management:  Continue current medication with prn's with permisive HTN per primary team Left sided hemiparesis  1. Does the need for close, 24 hr/day medical supervision in concert with the patient's rehab needs make it unreasonable for this patient to be served in a less intensive setting? Yes 2. Co-Morbidities requiring supervision/potential complications: HTN (monitor and provide prns in accordance with increased physical exertion and pain), PAF (monitor HR with increased physical activity), CVA '97, Parkinson's disease, Knee OA (Biofeedback training with therapies to help reduce reliance on opiate pain medications, monitor pain control during therapies, and sedation at rest and titrate to maximum efficacy to ensure participation and gains in therapies), leukocytosis (cont to monitor for signs and symptoms of infection, further workup if indicated), AKI vs. CKD (avoid nephrotoxic meds) 3. Due to safety, disease management, medication administration and patient education, does the patient require 24 hr/day rehab nursing? Yes 4. Does the patient require coordinated care of a physician, rehab nurse, PT (1-2 hrs/day, 5 days/week) and OT (1-2 hrs/day, 5 days/week) to address physical and functional deficits in the context of the above medical diagnosis(es)?  Yes Addressing deficits in the following areas: balance, endurance, locomotion, transferring, bathing, dressing, toileting and psychosocial support 5. Can the patient actively participate in an intensive therapy program of at least 3 hrs of therapy per day at least 5 days per week? Yes 6. The potential for patient to make measurable gains while on inpatient rehab is excellent 7. Anticipated functional outcomes upon discharge from inpatient rehab are min assist  with PT, supervision and min assist with OT, n/a with SLP. 8. Estimated rehab length of stay to reach the above functional goals is: 18-21 days. 9. Does the patient have adequate social supports and living environment to accommodate these discharge functional goals? No 10. Anticipated D/C setting: Home 11. Anticipated post D/C treatments: HH therapy and Home excercise program 12. Overall Rehab/Functional Prognosis: good  RECOMMENDATIONS: This patient's condition is appropriate for continued rehabilitative care in the following setting: CIR if caregiver support available at discharge after completion of medical workup. Patient has agreed to participate in recommended program. Yes Note that insurance prior authorization may be required for reimbursement for recommended care.  Comment: Rehab Admissions Coordinator to follow up.  Maryla MorrowAnkit Patel, MD, 24 Elizabeth StreetFAAPMR Love, Pamela S, [place] JerseyPA-C 08/09/2016

## 2016-08-10 LAB — CBC
HEMATOCRIT: 46.3 % (ref 39.0–52.0)
HEMOGLOBIN: 16 g/dL (ref 13.0–17.0)
MCH: 32.3 pg (ref 26.0–34.0)
MCHC: 34.6 g/dL (ref 30.0–36.0)
MCV: 93.3 fL (ref 78.0–100.0)
PLATELETS: 186 10*3/uL (ref 150–400)
RBC: 4.96 MIL/uL (ref 4.22–5.81)
RDW: 14.3 % (ref 11.5–15.5)
WBC: 12.7 10*3/uL — AB (ref 4.0–10.5)

## 2016-08-10 LAB — BASIC METABOLIC PANEL
ANION GAP: 11 (ref 5–15)
BUN: 15 mg/dL (ref 6–20)
CO2: 24 mmol/L (ref 22–32)
Calcium: 9.4 mg/dL (ref 8.9–10.3)
Chloride: 105 mmol/L (ref 101–111)
Creatinine, Ser: 1.51 mg/dL — ABNORMAL HIGH (ref 0.61–1.24)
GFR calc Af Amer: 47 mL/min — ABNORMAL LOW (ref >60)
GFR calc non Af Amer: 41 mL/min — ABNORMAL LOW (ref >60)
GLUCOSE: 101 mg/dL — AB (ref 65–99)
POTASSIUM: 4.2 mmol/L (ref 3.5–5.1)
Sodium: 140 mmol/L (ref 135–145)

## 2016-08-10 LAB — HEMOGLOBIN A1C
Hgb A1c MFr Bld: 5.4 % (ref 4.8–5.6)
Mean Plasma Glucose: 108 mg/dL

## 2016-08-10 MED ORDER — PANTOPRAZOLE SODIUM 40 MG PO TBEC
40.0000 mg | DELAYED_RELEASE_TABLET | Freq: Every day | ORAL | Status: DC
  Administered 2016-08-10: 40 mg via ORAL
  Filled 2016-08-10: qty 1

## 2016-08-10 NOTE — Progress Notes (Signed)
STROKE TEAM PROGRESS NOTE   HISTORY OF PRESENT ILLNESS (per record) [name] is a 83 y.o. male last seen well around 3:20 pm on 08/08/2016. He developed a left gaze deviation. Due to this continuing, he presented to the emergency room. He states that his left side feels slightly weaker than typical, but otherwise no associated symptoms. Of note, he has a history of and possible hemorrhage in the past. CT shows a small pontine hemorrhage. ICH Score: 2.  He was admitted to the neuro ICU for further evaluation and treatment.   SUBJECTIVE (INTERVAL HISTORY) Pt family reported that he has been very confused and as if he is hallucinating, I examined patient and I think that he is having mild delirium, I explained diagnosis to the family    OBJECTIVE Temp:  [97.3 F (36.3 C)-97.8 F (36.6 C)] 97.8 F (36.6 C) (03/03 0510) Pulse Rate:  [58-85] 70 (03/03 0510) Cardiac Rhythm: Atrial fibrillation (03/02 2000) Resp:  [15-26] 18 (03/03 0510) BP: (129-162)/(66-116) 146/77 (03/03 0510) SpO2:  [97 %-100 %] 97 % (03/03 0510)  CBC:  Recent Labs Lab 08/08/16 1700  08/09/16 0543 08/10/16 0236  WBC 14.8*  --  11.8* 12.7*  NEUTROABS 12.6*  --   --   --   HGB 15.0  < > 13.8 16.0  HCT 43.4  < > 40.0 46.3  MCV 92.7  --  92.0 93.3  PLT 173  --  166 186  < > = values in this interval not displayed.  Basic Metabolic Panel:   Recent Labs Lab 08/09/16 0543 08/10/16 0236  NA 138 140  K 4.0 4.2  CL 104 105  CO2 23 24  GLUCOSE 87 101*  BUN 19 15  CREATININE 1.45* 1.51*  CALCIUM 9.0 9.4    Lipid Panel:     Component Value Date/Time   CHOL 172 08/09/2016 0543   TRIG 122 08/09/2016 0543   HDL 35 (L) 08/09/2016 0543   CHOLHDL 4.9 08/09/2016 0543   VLDL 24 08/09/2016 0543   LDLCALC 113 (H) 08/09/2016 0543   HgbA1c:  Lab Results  Component Value Date   HGBA1C 5.4 08/09/2016   Urine Drug Screen:     Component Value Date/Time   LABOPIA NONE DETECTED 08/08/2016 1934   COCAINSCRNUR  NONE DETECTED 08/08/2016 1934   LABBENZ NONE DETECTED 08/08/2016 1934   AMPHETMU NONE DETECTED 08/08/2016 1934   THCU NONE DETECTED 08/08/2016 1934   LABBARB NONE DETECTED 08/08/2016 1934      IMAGING I have personally reviewed the radiological images below and agree with the radiology interpretations.  Ct Head Code Stroke W/o Cm 08/08/2016 1. Acute pontine hemorrhage, 1-2 cc in volume.  2. Extensive chronic ischemic injury as described.   Ct Head Wo Contrast 08/09/2016 Evolving acute small pontine hemorrhage.  Multiple old large territory and small vessel infarcts.   2-D echocardiogram Left ventricle: The cavity size was normal. There was mild   concentric hypertrophy. Systolic function was normal. The   estimated ejection fraction was in the range of 55% to 60%. Wall   motion was normal; there were no regional wall motion   abnormalities. - Aortic valve: There was trivial regurgitation. Valve area (VTI):   2.76 cm^2. Valve area (Vmax): 3.32 cm^2. Valve area (Vmean): 2.77   cm^2. - Mitral valve: Valve area by continuity equation (using LVOT   flow): 3.82 cm^2. - Left atrium: The atrium was mildly dilated. - Right atrium: The atrium was mildly dilated.   PHYSICAL  EXAM  Temp:  [97.3 F (36.3 C)-97.8 F (36.6 C)] 97.8 F (36.6 C) (03/03 0510) Pulse Rate:  [58-85] 70 (03/03 0510) Resp:  [15-26] 18 (03/03 0510) BP: (129-162)/(66-116) 146/77 (03/03 0510) SpO2:  [97 %-100 %] 97 % (03/03 0510)  General - Well nourished, well developed, in no apparent distress.  Ophthalmologic - Fundi not visualized due to eye movement.  Cardiovascular - Regular rate and rhythm.  Mental Status -  Level of arousal and orientation only to  person were intact. Language including expression, naming, repetition, comprehension was assessed and found intact, mild dysarthria  Cranial Nerves II - XII - II - Visual field intact OU. III, IV, VI - eye left gaze deviation, barely cross midline V -  Facial sensation intact bilaterally. VII - mild left facial droop. VIII - Hearing & vestibular intact bilaterally. X - Palate elevates symmetrically, mild dysarthria. XI - Chin turning & shoulder shrug intact bilaterally. XII - Tongue protrusion intact.  Motor Strength - The patient's strength was normal in all extremities and pronator drift was absent.  Bulk was normal and fasciculations were absent.   Motor Tone - Muscle tone was assessed at the neck and appendages and was normal.  Reflexes - The patient's reflexes were 1+ in all extremities and he had no pathological reflexes.  Sensory - Light touch, temperature/pinprick were assessed and were symmetrical.    Coordination - The patient had normal movements in the hands with no ataxia or dysmetria.  Tremor was absent.  Gait and Station - deferred.   ASSESSMENT/PLAN [name] is a 83 y.o. male with history of HTN and previous stroke presenting with left gaze deviation.   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 Remote right CR encephalomalacia. And bilateral remote cerebellar infarcts.  Follow up CT evolving small pontine hemorrhage.    2D Echo  EF 55-60%  LDL 113  HgbA1c - 5.4  SCDs for VTE prophylaxis Diet Heart Room service appropriate? Yes; Fluid consistency: Thin  aspirin 325 mg daily prior to admission, no antithrombotic at this time due to ICH.  Ongoing aggressive stroke risk factor management  Therapy recommendations:  Inpatient rehabilitation recommended. Rehabilitation M.D. consult obtained.  Disposition:  Transfer to floor   Delirium: mild, will hold off on giving any meds, encourage sleep at night and physical activities, discharge to rehab soon   Afib on EKG - ? New diagnosis  Not sure if pt has previous diagnosis of afib  afib captured on tele and  EKG  Not candidate for anticoagulation due to current ICH and hx of ICH  History of ICH as per patient and family  Right CR ICH 1997  Associated with left-sided hemiparesis  No significant residual over the years  Hypertension  Stable  Initially treated with cardene, now off, BP remains stable  Current SBP goal < 140 ;  now 149/70 - Consider increasing bisoprolol  On Ziac at home. Resume bisoprolol. Hold HCTZ for now given elevated Cr.  Long term BP goal normotensive  Hyperlipidemia  Home meds:  No statin  LDL 113, goal < 70  No need to initiate statin treatment at this time  Other Stroke Risk Factors  Advanced age  Hx of stroke - bilateral cerebellum remote lacunar infarct on MRI  Other Active Problems  CKD stage III, Cr 1.63->1.45 -> 1.51  Leukocytosis 14.8->11.8 -> 12.7 (Afebrile) -  UA neg.   Hospital day # 2  Total length of the visit 35 minutes.  To contact Stroke Continuity provider, please refer to [email or web address]. After hours, contact General Neurology

## 2016-08-10 NOTE — Evaluation (Signed)
Speech Language Pathology Evaluation Patient Details Name: [name] MRN: [id number] DOB: 1930/12/14 Today's Date: 08/10/2016 Time: [id number] SLP Time Calculation (min) (ACUTE ONLY): 29 min  Problem List:  Patient Active Problem List   Diagnosis Date Noted  . Hypertension   . ICH (intracerebral hemorrhage) (HCC)   . Benign essential HTN   . PAF (paroxysmal atrial fibrillation) (HCC)   . History of CVA (cerebrovascular accident)   . Parkinson disease (HCC)   . Primary osteoarthritis of right knee   . Leukocytosis   . AKI (acute kidney injury) (HCC)   . Stroke (cerebrum) (HCC) 08/08/2016   Past Medical History:  Past Medical History:  Diagnosis Date  . Hypertension   . Stroke [facility](HCC)    Past Surgical History: History reviewed. No pertinent surgical history. HPI:  Pt is an 83 y.o. male admitted 08/08/16 for new onset L facial droop and L gaze preference. CT showed acute pontine hemorrhage. PMH pertinent for HTN, HLD, and prior CVA with residual L-sided weakness (CT shows multiple chronic infarcts). NIHSS 3.   Assessment / Plan / Recommendation Clinical Impression  Patient presents with cognitive communication impairment with deficits in executive function, memory, attention, problem solving, judgment and awareness. Administered MOCA form 7.3, patient scored 14/30, indicating severe impairment. During testing, patient frequently required cues for initiation and task persistence. Initial recall 5/5, delayed recall 1/5: patient stated, "I didn't remember because it didn't sound important." Required extended processing time for all tasks, particularly executive function. CNA, RN report patient has frequently been attempting to get out of bed without assistance. Patient required verbal cues to state this was unsafe because "my balance is off." When asked why he attempted to get out of bed, patient stated, "I needed something." He states he should "call for help," but states he does not have  a call bell and is unable to locate it despite verbal and visual cues. Call bell was moved to the left side of the patient's bed to improve awareness, and he stated, "I see it now." Patient's speech intelligibility is reduced due to premorbid dysarthria and low vocal intensity. Patient also displayed emotional lability in conversation with SLP, began crying 2 or 3 times. He confirmed he sometimes cries or laughs at inappropriate times. Provided education to patient and family regarding cognitive communication deficits and recommendation for rehabilitation to target above deficits. Recommending inpatient rehab; SLP will follow acutely.      SLP Assessment  SLP Recommendation/Assessment: Patient needs continued Speech Lanaguage Pathology Services SLP Visit Diagnosis: Cognitive communication deficit (R41.841)    Follow Up Recommendations  Inpatient Rehab    Frequency and Duration min 2x/week  2 weeks      SLP Evaluation Cognition  Overall Cognitive Status: Impaired/Different from baseline Arousal/Alertness: Awake/alert Orientation Level: Oriented X4 Attention: Focused Focused Attention: Impaired Focused Attention Impairment: Verbal basic;Functional basic Memory: Impaired Memory Impairment: Decreased recall of new information Awareness: Impaired Awareness Impairment: Intellectual impairment Problem Solving: Impaired Problem Solving Impairment: Verbal basic;Functional basic Executive Function: Sequencing;Initiating;Self Monitoring;Self Correcting;Decision Making;Reasoning;Organizing Reasoning: Impaired Reasoning Impairment: Verbal basic;Functional basic Sequencing: Impaired Sequencing Impairment: Verbal basic;Functional basic Organizing: Impaired Organizing Impairment: Verbal basic;Functional basic Decision Making: Impaired Decision Making Impairment: Verbal basic;Functional basic Initiating: Impaired Initiating Impairment: Verbal basic;Functional basic Self Monitoring:  Impaired Self Monitoring Impairment: Verbal basic;Functional basic Self Correcting: Impaired Self Correcting Impairment: Verbal basic;Functional basic Behaviors: Lability Safety/Judgment: Impaired       Comprehension  Auditory Comprehension Overall Auditory Comprehension: Appears within functional limits for tasks assessed Yes/No Questions:  Within Functional Limits Commands: Within Functional Limits Conversation: Complex Interfering Components: Attention;Processing speed;Working Radio broadcast assistant: Extra processing time;Slowed speech;Repetition;Pausing Visual Recognition/Discrimination Discrimination: Not tested Reading Comprehension Reading Status: Not tested    Expression Expression Primary Mode of Expression: Verbal Verbal Expression Overall Verbal Expression: Appears within functional limits for tasks assessed Repetition: Impaired Level of Impairment: Sentence level Naming: No impairment Pragmatics: Impairment Impairments: Abnormal affect;Eye contact;Monotone Interfering Components: Attention;Premorbid deficit;Speech intelligibility Effective Techniques: Open ended questions Non-Verbal Means of Communication: Not applicable Written Expression Dominant Hand: Left Written Expression: Not tested   Oral / Motor  Oral Motor/Sensory Function Overall Oral Motor/Sensory Function: Mild impairment Facial ROM: Reduced left;Other (Comment) (from prior CVA) Facial Symmetry: Abnormal symmetry left (from prior CVA) Facial Strength: Reduced left Facial Sensation: Within Functional Limits Lingual ROM: Within Functional Limits Lingual Symmetry: Within Functional Limits Lingual Strength: Within Functional Limits Lingual Sensation: Within Functional Limits Velum: Within Functional Limits Mandible: Within Functional Limits Motor Speech Overall Motor Speech: Impaired at baseline Respiration: Within functional limits Phonation: Low vocal intensity Resonance:  Hypernasality Articulation: Impaired Level of Impairment: Sentence Intelligibility: Intelligibility reduced Word: 75-100% accurate Phrase: 75-100% accurate Sentence: 50-74% accurate Conversation: 50-74% accurate Motor Planning: Witnin functional limits Interfering Components: Premorbid status Effective Techniques: Slow rate;Increased vocal intensity;Over-articulate   GO                   Rondel Baton, [place] CF-SLP Speech-Language Pathologist [phone]  Arlana Lindau 08/10/2016, 12:02 PM

## 2016-08-11 LAB — CBC
HCT: 42 % (ref 39.0–52.0)
Hemoglobin: 14.6 g/dL (ref 13.0–17.0)
MCH: 32.1 pg (ref 26.0–34.0)
MCHC: 34.8 g/dL (ref 30.0–36.0)
MCV: 92.3 fL (ref 78.0–100.0)
PLATELETS: 184 10*3/uL (ref 150–400)
RBC: 4.55 MIL/uL (ref 4.22–5.81)
RDW: 14 % (ref 11.5–15.5)
WBC: 11 10*3/uL — ABNORMAL HIGH (ref 4.0–10.5)

## 2016-08-11 LAB — BASIC METABOLIC PANEL
Anion gap: 10 (ref 5–15)
BUN: 16 mg/dL (ref 6–20)
CALCIUM: 8.7 mg/dL — AB (ref 8.9–10.3)
CO2: 23 mmol/L (ref 22–32)
CREATININE: 1.39 mg/dL — AB (ref 0.61–1.24)
Chloride: 106 mmol/L (ref 101–111)
GFR calc Af Amer: 52 mL/min — ABNORMAL LOW (ref >60)
GFR, EST NON AFRICAN AMERICAN: 45 mL/min — AB (ref >60)
Glucose, Bld: 109 mg/dL — ABNORMAL HIGH (ref 65–99)
POTASSIUM: 3.7 mmol/L (ref 3.5–5.1)
SODIUM: 139 mmol/L (ref 135–145)

## 2016-08-11 MED ORDER — BISOPROLOL FUMARATE 10 MG PO TABS
10.0000 mg | ORAL_TABLET | Freq: Every day | ORAL | Status: DC
Start: 2016-08-11 — End: 2016-08-12
  Administered 2016-08-11 – 2016-08-12 (×2): 10 mg via ORAL
  Filled 2016-08-11 (×2): qty 1

## 2016-08-11 MED ORDER — BISOPROLOL FUMARATE 10 MG PO TABS: 10.0000 mg | ORAL_TABLET | Freq: Every day | ORAL | Status: DC

## 2016-08-11 NOTE — Progress Notes (Signed)
STROKE TEAM PROGRESS NOTE   HISTORY OF PRESENT ILLNESS (per record) [name] is a [age] y.o. male last seen well around 3:20 pm on 08/08/2016. He developed a left gaze deviation. Due to this continuing, he presented to the emergency room. He states that his left side feels slightly weaker than typical, but otherwise no associated symptoms. Of note, he has a history of and possible hemorrhage in the past. CT shows a small pontine hemorrhage. ICH Score: 2.  He was admitted to the neuro ICU for further evaluation and treatment.   SUBJECTIVE (INTERVAL HISTORY) Pt continues to improve   OBJECTIVE Temp:  [97.5 F (36.4 C)-98.9 F (37.2 C)] 97.5 F (36.4 C) (03/04 0400) Pulse Rate:  [79-83] 79 (03/04 0400) Cardiac Rhythm: Atrial fibrillation (03/03 1900) Resp:  [18] 18 (03/04 0400) BP: (139-159)/(63-88) 156/88 (03/04 0400) SpO2:  [95 %-98 %] 95 % (03/04 0400)  CBC:  Recent Labs Lab 08/08/16 1700  08/10/16 0236 08/11/16 0200  WBC 14.8*  < > 12.7* 11.0*  NEUTROABS 12.6*  --   --   --   HGB 15.0  < > 16.0 14.6  HCT 43.4  < > 46.3 42.0  MCV 92.7  < > 93.3 92.3  PLT 173  < > 186 184  < > = values in this interval not displayed.  Basic Metabolic Panel:   Recent Labs Lab 08/10/16 0236 08/11/16 0200  NA 140 139  K 4.2 3.7  CL 105 106  CO2 24 23  GLUCOSE 101* 109*  BUN 15 16  CREATININE 1.51* 1.39*  CALCIUM 9.4 8.7*    Lipid Panel:     Component Value Date/Time   CHOL 172 08/09/2016 0543   TRIG 122 08/09/2016 0543   HDL 35 (L) 08/09/2016 0543   CHOLHDL 4.9 08/09/2016 0543   VLDL 24 08/09/2016 0543   LDLCALC 113 (H) 08/09/2016 0543   HgbA1c:  Lab Results  Component Value Date   HGBA1C 5.4 08/09/2016   Urine Drug Screen:     Component Value Date/Time   LABOPIA NONE DETECTED 08/08/2016 1934   COCAINSCRNUR NONE DETECTED 08/08/2016 1934   LABBENZ NONE DETECTED 08/08/2016 1934   AMPHETMU NONE DETECTED 08/08/2016 1934   THCU NONE DETECTED 08/08/2016 1934   LABBARB  NONE DETECTED 08/08/2016 1934      IMAGING I have personally reviewed the radiological images below and agree with the radiology interpretations.  Ct Head Code Stroke W/o Cm 08/08/2016 1. Acute pontine hemorrhage, 1-2 cc in volume.  2. Extensive chronic ischemic injury as described.   Ct Head Wo Contrast 08/09/2016 Evolving acute small pontine hemorrhage.  Multiple old large territory and small vessel infarcts.   2-D echocardiogram Left ventricle: The cavity size was normal. There was mild   concentric hypertrophy. Systolic function was normal. The   estimated ejection fraction was in the range of 55% to 60%. Wall   motion was normal; there were no regional wall motion   abnormalities. - Aortic valve: There was trivial regurgitation. Valve area (VTI):   2.76 cm^2. Valve area (Vmax): 3.32 cm^2. Valve area (Vmean): 2.77   cm^2. - Mitral valve: Valve area by continuity equation (using LVOT   flow): 3.82 cm^2. - Left atrium: The atrium was mildly dilated. - Right atrium: The atrium was mildly dilated.   PHYSICAL EXAM  Temp:  [97.5 F (36.4 C)-98.9 F (37.2 C)] 97.5 F (36.4 C) (03/04 0400) Pulse Rate:  [79-83] 79 (03/04 0400) Resp:  [18] 18 (03/04 0400)  BP: (139-159)/(63-88) 156/88 (03/04 0400) SpO2:  [95 %-98 %] 95 % (03/04 0400)  General - Well nourished, well developed, in no apparent distress.  Ophthalmologic - Fundi not visualized due to eye movement.  Cardiovascular - Regular rate and rhythm.  Mental Status -  Level of arousal and orientation only to  person were intact. Language including expression, naming, repetition, comprehension was assessed and found intact, mild dysarthria  Cranial Nerves II - XII - II - Visual field intact OU. V - Facial sensation intact bilaterally. VII - mild left facial droop. VIII - Hearing & vestibular intact bilaterally. X - Palate elevates symmetrically, mild dysarthria. XI - Chin turning & shoulder shrug intact  bilaterally. XII - Tongue protrusion intact.  Motor Strength - The patient's strength was normal in all extremities and pronator drift was absent.  Bulk was normal and fasciculations were absent.   Motor Tone - Muscle tone was assessed at the neck and appendages and was normal.  Reflexes - The patient's reflexes were 1+ in all extremities and he had no pathological reflexes.  Sensory - Light touch, temperature/pinprick were assessed and were symmetrical.    Coordination - The patient had normal movements in the hands with no ataxia or dysmetria.  Tremor was absent.  Gait and Station - deferred.   ASSESSMENT/PLAN [name] is a 83 y.o. male with history of HTN and previous stroke presenting with left gaze deviation.   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 Remote right CR encephalomalacia. And bilateral remote cerebellar infarcts.  Follow up CT evolving small pontine hemorrhage.    2D Echo  EF 55-60%  LDL 113  HgbA1c - 5.4  SCDs for VTE prophylaxis Diet Heart Room service appropriate? Yes; Fluid consistency: Thin  aspirin 325 mg daily prior to admission, no antithrombotic at this time due to ICH.  Ongoing aggressive stroke risk factor management  Therapy recommendations:  Inpatient rehabilitation recommended. Rehabilitation M.D. consult obtained.  Disposition:  Rehab next week  Delirium: mild, improving  Afib on EKG - ? New diagnosis  Not sure if pt has previous diagnosis of afib  afib captured on tele and EKG  Not candidate for anticoagulation due to current ICH and hx of ICH  History of ICH as per patient and family  Right CR ICH 1997  Associated with left-sided hemiparesis  No significant residual over the years  Hypertension  Stable  Initially treated with cardene, now off, BP remains stable  Current  SBP goal < 140 ;  now 156/88 -> Will increase bisoprolol to 10 mg daily.  On Ziac at home. Resume bisoprolol. Hold HCTZ for now given elevated Cr.  Long term BP goal normotensive  Hyperlipidemia  Home meds:  No statin  LDL 113, goal < 70  No need to initiate statin treatment at this time  Other Stroke Risk Factors  Advanced age  Hx of stroke - bilateral cerebellum remote lacunar infarct on MRI  Other Active Problems  CKD stage III, Cr 1.63->1.45 -> 1.51 -> 1.39  Leukocytosis 14.8->11.8 -> 12.7 -> 11.0 (Afebrile) - UA neg.   Hospital day # 3  Total length of the visit 35 minutes.  To contact Stroke Continuity provider, please refer to [email or web address]. After hours, contact General Neurology

## 2016-08-12 ENCOUNTER — Encounter (HOSPITAL_COMMUNITY): Payer: Self-pay | Admitting: Physical Medicine and Rehabilitation

## 2016-08-12 ENCOUNTER — Inpatient Hospital Stay (HOSPITAL_COMMUNITY)
Admission: RE | Admit: 2016-08-12 | Discharge: 2016-09-03 | DRG: 057 | Disposition: A | Discharge status: Home Health | Payer: Medicare Other | Source: Intra-hospital | Attending: Physical Medicine & Rehabilitation | Admitting: Physical Medicine & Rehabilitation

## 2016-08-12 DIAGNOSIS — R339 Retention of urine, unspecified: Secondary | ICD-10-CM | POA: Diagnosis not present

## 2016-08-12 DIAGNOSIS — N401 Enlarged prostate with lower urinary tract symptoms: Secondary | ICD-10-CM | POA: Diagnosis not present

## 2016-08-12 DIAGNOSIS — L309 Dermatitis, unspecified: Secondary | ICD-10-CM

## 2016-08-12 DIAGNOSIS — R3916 Straining to void: Secondary | ICD-10-CM | POA: Diagnosis not present

## 2016-08-12 DIAGNOSIS — N189 Chronic kidney disease, unspecified: Secondary | ICD-10-CM | POA: Diagnosis not present

## 2016-08-12 DIAGNOSIS — B9620 Unspecified Escherichia coli [E. coli] as the cause of diseases classified elsewhere: Secondary | ICD-10-CM

## 2016-08-12 DIAGNOSIS — I69122 Dysarthria following nontraumatic intracerebral hemorrhage: Secondary | ICD-10-CM | POA: Diagnosis not present

## 2016-08-12 DIAGNOSIS — I69152 Hemiplegia and hemiparesis following nontraumatic intracerebral hemorrhage affecting left dominant side: Principal | ICD-10-CM

## 2016-08-12 DIAGNOSIS — R269 Unspecified abnormalities of gait and mobility: Secondary | ICD-10-CM | POA: Diagnosis not present

## 2016-08-12 DIAGNOSIS — Z7982 Long term (current) use of aspirin: Secondary | ICD-10-CM

## 2016-08-12 DIAGNOSIS — E876 Hypokalemia: Secondary | ICD-10-CM

## 2016-08-12 DIAGNOSIS — E785 Hyperlipidemia, unspecified: Secondary | ICD-10-CM | POA: Diagnosis present

## 2016-08-12 DIAGNOSIS — G20 Parkinson's disease: Secondary | ICD-10-CM | POA: Diagnosis not present

## 2016-08-12 DIAGNOSIS — I69391 Dysphagia following cerebral infarction: Secondary | ICD-10-CM | POA: Diagnosis not present

## 2016-08-12 DIAGNOSIS — N3000 Acute cystitis without hematuria: Secondary | ICD-10-CM | POA: Diagnosis not present

## 2016-08-12 DIAGNOSIS — Z8673 Personal history of transient ischemic attack (TIA), and cerebral infarction without residual deficits: Secondary | ICD-10-CM

## 2016-08-12 DIAGNOSIS — G8194 Hemiplegia, unspecified affecting left nondominant side: Secondary | ICD-10-CM | POA: Diagnosis not present

## 2016-08-12 DIAGNOSIS — I613 Nontraumatic intracerebral hemorrhage in brain stem: Secondary | ICD-10-CM | POA: Diagnosis not present

## 2016-08-12 DIAGNOSIS — M179 Osteoarthritis of knee, unspecified: Secondary | ICD-10-CM

## 2016-08-12 DIAGNOSIS — I10 Essential (primary) hypertension: Secondary | ICD-10-CM | POA: Diagnosis present

## 2016-08-12 DIAGNOSIS — I69119 Unspecified symptoms and signs involving cognitive functions following nontraumatic intracerebral hemorrhage: Secondary | ICD-10-CM | POA: Diagnosis not present

## 2016-08-12 DIAGNOSIS — Z79899 Other long term (current) drug therapy: Secondary | ICD-10-CM

## 2016-08-12 DIAGNOSIS — I69398 Other sequelae of cerebral infarction: Secondary | ICD-10-CM | POA: Diagnosis not present

## 2016-08-12 DIAGNOSIS — N390 Urinary tract infection, site not specified: Secondary | ICD-10-CM

## 2016-08-12 DIAGNOSIS — A499 Bacterial infection, unspecified: Secondary | ICD-10-CM | POA: Diagnosis not present

## 2016-08-12 DIAGNOSIS — I480 Paroxysmal atrial fibrillation: Secondary | ICD-10-CM | POA: Diagnosis not present

## 2016-08-12 DIAGNOSIS — D72829 Elevated white blood cell count, unspecified: Secondary | ICD-10-CM | POA: Diagnosis not present

## 2016-08-12 DIAGNOSIS — H4921 Sixth [abducent] nerve palsy, right eye: Secondary | ICD-10-CM | POA: Diagnosis not present

## 2016-08-12 DIAGNOSIS — R7989 Other specified abnormal findings of blood chemistry: Secondary | ICD-10-CM | POA: Diagnosis not present

## 2016-08-12 DIAGNOSIS — G20A1 Parkinson's disease without dyskinesia, without mention of fluctuations: Secondary | ICD-10-CM | POA: Diagnosis present

## 2016-08-12 DIAGNOSIS — N179 Acute kidney failure, unspecified: Secondary | ICD-10-CM

## 2016-08-12 DIAGNOSIS — D72825 Bandemia: Secondary | ICD-10-CM | POA: Diagnosis not present

## 2016-08-12 MED ORDER — BISACODYL 10 MG RE SUPP: 10.0000 mg | Freq: Every day | RECTAL | Status: DC | PRN

## 2016-08-12 MED ORDER — TRAZODONE HCL 50 MG PO TABS
25.0000 mg | ORAL_TABLET | Freq: Every evening | ORAL | Status: DC | PRN
  Administered 2016-08-19: 50 mg via ORAL
  Filled 2016-08-12: qty 1

## 2016-08-12 MED ORDER — BISOPROLOL FUMARATE 10 MG PO TABS
10.0000 mg | ORAL_TABLET | Freq: Every day | ORAL | Status: DC
  Filled 2016-08-12: qty 1

## 2016-08-12 MED ORDER — FLEET ENEMA 7-19 GM/118ML RE ENEM: 1.0000 | ENEMA | Freq: Once | RECTAL | Status: DC | PRN

## 2016-08-12 MED ORDER — PROCHLORPERAZINE MALEATE 5 MG PO TABS: 5.0000 mg | ORAL_TABLET | Freq: Four times a day (QID) | ORAL | Status: DC | PRN

## 2016-08-12 MED ORDER — PROCHLORPERAZINE EDISYLATE 5 MG/ML IJ SOLN: 5.0000 mg | Freq: Four times a day (QID) | INTRAMUSCULAR | Status: DC | PRN

## 2016-08-12 MED ORDER — PANTOPRAZOLE SODIUM 40 MG PO TBEC: 40.0000 mg | DELAYED_RELEASE_TABLET | Freq: Every day | ORAL | Status: DC

## 2016-08-12 MED ORDER — ALUM & MAG HYDROXIDE-SIMETH 200-200-20 MG/5ML PO SUSP: 30.0000 mL | ORAL | Status: DC | PRN

## 2016-08-12 MED ORDER — BISOPROLOL FUMARATE 10 MG PO TABS
10.0000 mg | ORAL_TABLET | Freq: Every day | ORAL | Status: DC
  Administered 2016-08-13 – 2016-08-17 (×5): 10 mg via ORAL
  Filled 2016-08-12 (×5): qty 1

## 2016-08-12 MED ORDER — SENNOSIDES-DOCUSATE SODIUM 8.6-50 MG PO TABS
2.0000 | ORAL_TABLET | Freq: Every day | ORAL | Status: DC
  Administered 2016-08-13 – 2016-09-02 (×22): 2 via ORAL
  Filled 2016-08-12 (×22): qty 2

## 2016-08-12 MED ORDER — LABETALOL HCL 5 MG/ML IV SOLN: 10.0000 mg | INTRAVENOUS | Status: DC | PRN

## 2016-08-12 MED ORDER — PROCHLORPERAZINE 25 MG RE SUPP: 12.5000 mg | Freq: Four times a day (QID) | RECTAL | Status: DC | PRN

## 2016-08-12 MED ORDER — ENOXAPARIN SODIUM 40 MG/0.4ML ~~LOC~~ SOLN
40.0000 mg | SUBCUTANEOUS | Status: DC
  Administered 2016-08-12: 40 mg via SUBCUTANEOUS
  Filled 2016-08-12: qty 0.4

## 2016-08-12 MED ORDER — GUAIFENESIN-DM 100-10 MG/5ML PO SYRP: 5.0000 mL | ORAL_SOLUTION | Freq: Four times a day (QID) | ORAL | Status: DC | PRN

## 2016-08-12 MED ORDER — ACETAMINOPHEN 325 MG PO TABS
325.0000 mg | ORAL_TABLET | ORAL | Status: DC | PRN
  Filled 2016-08-12 (×2): qty 2

## 2016-08-12 MED ORDER — PANTOPRAZOLE SODIUM 40 MG PO TBEC
40.0000 mg | DELAYED_RELEASE_TABLET | Freq: Every day | ORAL | Status: DC
  Administered 2016-08-13 – 2016-09-03 (×22): 40 mg via ORAL
  Filled 2016-08-12 (×22): qty 1

## 2016-08-12 MED ORDER — POLYETHYLENE GLYCOL 3350 17 G PO PACK: 17.0000 g | PACK | Freq: Every day | ORAL | Status: DC | PRN

## 2016-08-12 MED ORDER — DIPHENHYDRAMINE HCL 12.5 MG/5ML PO ELIX: 12.5000 mg | ORAL_SOLUTION | Freq: Four times a day (QID) | ORAL | Status: DC | PRN

## 2016-08-12 MED ORDER — CLONIDINE HCL 0.1 MG PO TABS
0.1000 mg | ORAL_TABLET | Freq: Four times a day (QID) | ORAL | Status: DC | PRN
  Administered 2016-08-12: 0.1 mg via ORAL
  Filled 2016-08-12: qty 1

## 2016-08-12 NOTE — Progress Notes (Signed)
Occupational Therapy Treatment Patient Details Name: [name] MRN: [id number] DOB: 1930/06/17 Today's Date: 08/12/2016    History of present illness Pt is an 83 y.o. male admitted 08/08/16 for new onset L facial droop and L gaze preference. CT showed acute pontine hemorrhage. PMH pertinent for HTN, HLD, and prior CVA with residual L-sided weakness (CT shows multiple chronic infarcts). NIHSS 3.    OT comments  Pt demonstrates improving balance requiring mod A for standing balance and min A for sitting.  Worked on facilitation of weigh shift to the Lt.  He is able to track ~3/4 to Rt today.  He requires min A - mod A for ADLs.  Continue to recommend CIR.   Follow Up Recommendations  CIR;Supervision/Assistance - 24 hour    Equipment Recommendations  Tub/shower bench    Recommendations for Other Services Rehab consult    Precautions / Restrictions Precautions Precautions: Fall Restrictions Weight Bearing Restrictions: No       Mobility Bed Mobility Overal bed mobility: Needs Assistance Bed Mobility: Supine to Sit     Supine to sit: Min guard     General bed mobility comments: requires inreased time   Transfers Overall transfer level: Needs assistance Equipment used: 1 person hand held assist Transfers: Stand Pivot Transfers;Sit to/from Stand Sit to Stand: Min assist Stand pivot transfers: Mod assist       General transfer comment: assist for balance and to facilitate weigh shift     Balance Overall balance assessment: Needs assistance Sitting-balance support: Feet supported;Bilateral upper extremity supported Sitting balance-Leahy Scale: Fair Sitting balance - Comments: pt requires min guard assist for static sitting and min A for dynamic sitting.  Worked on Reaching with Rt UE to faciliatate trunk extension/control and weighshift to the Rt   Postural control: Left lateral lean;Posterior lean Standing balance support: Bilateral upper extremity  supported Standing balance-Leahy Scale: Poor Standing balance comment: Pt reliant on bil. UE support and min - mod A                    ADL Overall ADL's : Needs assistance/impaired                     Lower Body Dressing: Moderate assistance;Sit to/from stand Lower Body Dressing Details (indicate cue type and reason): Pt requires mod A to don and doff socks and mod A for standing balance  Toilet Transfer: Moderate assistance;Stand-pivot;BSC   Toileting- Clothing Manipulation and Hygiene: Moderate assistance;Sit to/from stand       Functional mobility during ADLs: Moderate assistance;Rolling walker General ADL Comments: wife present       Vision                 Additional Comments: Pt is able to track 3/4 of way to the Rt today.  He denies diplopia.  Nystagmus and oscillopsia noted with Lt gaze    Perception     Praxis      Cognition   Behavior During Therapy: [facility] for tasks assessed/performed Overall Cognitive Status: Impaired/Different from baseline Area of Impairment: Attention;Following commands;Safety/judgement;Problem solving   Current Attention Level: Selective    Following Commands: Follows multi-step commands inconsistently Safety/Judgement: Decreased awareness of safety;Decreased awareness of deficits Awareness: Emergent Problem Solving: Difficulty sequencing;Requires verbal cues General Comments: Pt able to recall me from Friday.  He demonstrates intermittent confusion.  Min cues for problem solving and safety       Exercises     Shoulder Instructions  General Comments      Pertinent Vitals/ Pain       Pain Assessment: No/denies pain  Home Living                                          Prior Functioning/Environment              Frequency  Min 2X/week        Progress Toward Goals  OT Goals(current goals can now be found in the care plan section)  Progress towards OT goals: Progressing  toward goals     Plan Discharge plan remains appropriate    Co-evaluation                 End of Session Equipment Utilized During Treatment: Gait belt  OT Visit Diagnosis: Unsteadiness on feet (R26.81)   Activity Tolerance Patient tolerated treatment well   Patient Left in chair;with call bell/phone within reach;with chair alarm set;with family/visitor present   Nurse Communication Mobility status        Time: [id number] OT Time Calculation (min): 27 min  Charges: OT General Charges $OT Visit: 1 Procedure OT Treatments $Self Care/Home Management : 8-22 mins $Neuromuscular Re-education: 8-22 mins  Reynolds AmericanWendi Gurtaj Ruz, OTR/L [phone]    Jeani HawkingConarpe, Vendela Troung M 08/12/2016, 12:10 PM

## 2016-08-12 NOTE — Progress Notes (Signed)
Report given to nurse in rehab. Pt transferring to room 4W08.

## 2016-08-12 NOTE — Care Management Note (Signed)
Case Management Note  Patient Details  Name: Para Skeanslbert Brogan MRN: [id number] Date of Birth: 1930/09/27  Subjective/Objective:                    Action/Plan: Pt discharged to CIR today. No further needs per CM.  Expected Discharge Date:  08/12/16               Expected Discharge Plan:  IP Rehab Facility  In-House Referral:     Discharge planning Services  CM Consult  Post Acute Care Choice:    Choice offered to:     DME Arranged:    DME Agency:     HH Arranged:    HH Agency:     Status of Service:  Completed, signed off  If discussed at [employer] Meetings, dates discussed:    Additional Comments:  Kermit BaloKelli F Peggy Loge, RN 08/12/2016, 7:23 PM

## 2016-08-12 NOTE — Progress Notes (Signed)
Rehab admissions - I met with patient, his wife and his son at the bedside.  I explained inpatient rehab to family.  They are in agreement to inpatient rehab here at [facility].  Patient has been cleared by attending MD.  Bed available and will admit to acute inpatient rehab today.  Call me for questions.  #[phone]

## 2016-08-12 NOTE — Care Management Important Message (Signed)
Important Message  Patient Details  Name: [name] MRN: [id number] Date of Birth: March 22, 1931   Medicare Important Message Given:  Yes    Navika Hoopes Stefan ChurchBratton 08/12/2016, 1:57 PM

## 2016-08-12 NOTE — Progress Notes (Signed)
Physical Therapy Treatment Patient Details Name: [name] MRN: [id number] DOB: November 27, 1930 Today's Date: 08/12/2016    History of Present Illness Pt is an 83 y.o. male admitted 08/08/16 for new onset L facial droop and L gaze preference. CT showed acute pontine hemorrhage. PMH pertinent for HTN, HLD, and prior CVA with residual L-sided weakness (CT shows multiple chronic infarcts). NIHSS 3.     PT Comments    Patient progressing with ambulation in the room, though still pushing to L and with poor visual reference despite cues.  Feel he will do well in CIR rehab prior to d/c home.    Follow Up Recommendations  CIR     Equipment Recommendations  None recommended by PT    Recommendations for Other Services       Precautions / Restrictions Precautions Precautions: Fall Precaution Comments: L pusher    Mobility  Bed Mobility Overal bed mobility: Needs Assistance Bed Mobility: Supine to Sit     Supine to sit: Min guard     General bed mobility comments: cues for technique, assist for safety  Transfers Overall transfer level: Needs assistance Equipment used: Rolling walker (2 wheeled) Transfers: Sit to/from Stand Sit to Stand: Mod assist;Max assist Stand pivot transfers: Mod assist       General transfer comment: lifting assist, lowering assist at times  Ambulation/Gait Ambulation/Gait assistance: Mod assist;Max assist Ambulation Distance (Feet): 14 Feet Assistive device: Rolling walker (2 wheeled) Gait Pattern/deviations: Decreased stride length;Step-to pattern;Decreased dorsiflexion - left;Decreased weight shift to right;Shuffle;Trunk flexed     General Gait Details: ambulated around the bed, initially able to correct L lateral  lean with visual cues to vertical wall in front of him, but as fatigues unable to correct and leans hard to L.  Also side stepping to [facility] with RW and mod/max A due to pushing to L   Stairs            Wheelchair Mobility     Modified Rankin (Stroke Patients Only) Modified Rankin (Stroke Patients Only) Pre-Morbid Rankin Score: No significant disability Modified Rankin: Moderately severe disability     Balance Overall balance assessment: Needs assistance Sitting-balance support: Feet supported;Bilateral upper extremity supported Sitting balance-Leahy Scale: Fair Sitting balance - Comments: sitting EOB to look around room to R and L sides, able to locate items on R side, but not read print, but could tell wife had on shirt with a red bird (cardinal) on it. Postural control: Left lateral lean;Posterior lean Standing balance support: Bilateral upper extremity supported Standing balance-Leahy Scale: Poor Standing balance comment: leans L in standing with UE support needing mod A for balance                    Cognition Arousal/Alertness: Awake/alert Behavior During Therapy: WFL for tasks assessed/performed Overall Cognitive Status: Impaired/Different from baseline Area of Impairment: Attention;Following commands;Safety/judgement   Current Attention Level: Selective   Following Commands: Follows multi-step commands inconsistently Safety/Judgement: Decreased awareness of safety;Decreased awareness of deficits Awareness: Emergent Problem Solving: Difficulty sequencing;Requires verbal cues General Comments: Pt able to recall me from Friday.  He demonstrates intermittent confusion.  Min cues for problem solving and safety     Exercises      General Comments        Pertinent Vitals/Pain Pain Assessment: No/denies pain    Home Living                      Prior Function  PT Goals (current goals can now be found in the care plan section) Progress towards PT goals: Progressing toward goals    Frequency    Min 4X/week      PT Plan Current plan remains appropriate    Co-evaluation             End of Session Equipment Utilized During Treatment: Gait  belt Activity Tolerance: Patient tolerated treatment well Patient left: in bed;with call bell/phone within reach;with family/visitor present;with bed alarm set   PT Visit Diagnosis: Unsteadiness on feet (R26.81);Other abnormalities of gait and mobility (R26.89)     Time: 1314-1340 PT Time Calculation (min) (ACUTE ONLY): 26 min  Charges:  $Gait Training: 8-22 mins $Neuromuscular Re-education: 8-22 mins                    G Codes:       Elray McgregorCynthia Chidera Thivierge 08/12/2016, 2:18 PM  Sheran Lawlessyndi Khamila Bassinger, PT [phone] 08/12/2016

## 2016-08-12 NOTE — Progress Notes (Signed)
MD aware that no parameters for labetalol. New orders carried out.

## 2016-08-12 NOTE — Discharge Summary (Signed)
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 Hypertension   . Stroke [facility])    History reviewed. No pertinent surgical history.  Medications to be continued on Rehab . bisoprolol  10 mg Oral Daily  . pantoprazole  40 mg Oral QHS  . senna-docusate  1 tablet Oral BID    LABORATORY STUDIES CBC    Component Value Date/Time   WBC 11.0 (H) 08/11/2016 0200   RBC 4.55 08/11/2016 0200   HGB 14.6 08/11/2016 0200   HCT 42.0 08/11/2016 0200   PLT 184 08/11/2016 0200   MCV 92.3 08/11/2016 0200   MCH 32.1 08/11/2016 0200   MCHC 34.8 08/11/2016 0200   RDW 14.0 08/11/2016 0200   LYMPHSABS 1.3 08/08/2016 1700   MONOABS 0.8 08/08/2016 1700   EOSABS 0.1 08/08/2016 1700   BASOSABS 0.0 08/08/2016 1700   CMP    Component Value Date/Time   NA 139 08/11/2016 0200   K 3.7 08/11/2016 0200   CL 106 08/11/2016 0200   CO2 23 08/11/2016 0200   GLUCOSE 109 (H) 08/11/2016 0200   BUN 16 08/11/2016 0200   CREATININE 1.39 (H) 08/11/2016 0200   CALCIUM 8.7 (L) 08/11/2016 0200   PROT 7.2 08/08/2016 1700   ALBUMIN 3.9 08/08/2016 1700   AST 28 08/08/2016 1700   ALT 16 (L) 08/08/2016 1700   ALKPHOS 46 08/08/2016 1700   BILITOT 0.6 08/08/2016 1700   GFRNONAA 45 (L) 08/11/2016 0200   GFRAA 52 (L) 08/11/2016 0200   COAGS Lab Results  Component Value Date   INR 1.11  08/08/2016   Lipid Panel    Component Value Date/Time   CHOL 172 08/09/2016 0543   TRIG 122 08/09/2016 0543   HDL 35 (L) 08/09/2016 0543   CHOLHDL 4.9 08/09/2016 0543   VLDL 24 08/09/2016 0543   LDLCALC 113 (H) 08/09/2016 0543   HgbA1C  Lab Results  Component Value Date   HGBA1C 5.4 08/09/2016   Urinalysis    Component Value Date/Time   COLORURINE YELLOW 08/08/2016 1934   APPEARANCEUR CLEAR 08/08/2016 1934   LABSPEC 1.016 08/08/2016 1934   PHURINE 6.0 08/08/2016 1934   GLUCOSEU NEGATIVE 08/08/2016 1934   HGBUR NEGATIVE 08/08/2016 1934   BILIRUBINUR NEGATIVE 08/08/2016 1934   KETONESUR NEGATIVE 08/08/2016 1934   PROTEINUR NEGATIVE 08/08/2016 1934   NITRITE NEGATIVE 08/08/2016 1934   LEUKOCYTESUR NEGATIVE 08/08/2016 1934   Urine Drug Screen     Component Value Date/Time   LABOPIA NONE DETECTED 08/08/2016 1934   COCAINSCRNUR NONE DETECTED 08/08/2016 1934   LABBENZ NONE DETECTED 08/08/2016 1934   AMPHETMU NONE DETECTED 08/08/2016 1934   THCU NONE DETECTED 08/08/2016 1934   LABBARB NONE DETECTED 08/08/2016 1934    Alcohol Level    Component Value Date/Time   ETH <5 08/08/2016 1700     SIGNIFICANT DIAGNOSTIC STUDIES  Ct Head Code Stroke W/o Cm 08/08/2016 1. Acute pontine hemorrhage, 1-2 cc in volume.  2. Extensive chronic ischemic injury as described.   Ct Head Wo Contrast 08/09/2016 Evolving acute small pontine hemorrhage.  Multiple old large territory and small vessel infarcts.   2-D echocardiogram - Left ventricle: The cavity size was normal. There was mildconcentric hypertrophy. Systolic function was normal. Theestimated ejection fraction was in the range of 55% to 60%. Wallmotion was normal; there were no regional wall motionabnormalities. - Aortic valve: There was trivial regurgitation. Valve area (VTI):2.76 cm^2. Valve area (Vmax): 3.32 cm^2. Valve area (Vmean): 2.77cm^2. - Mitral valve: Valve area by continuity equation (using LVOTflow): 3.82  cm^2. - Left atrium: The atrium was mildly dilated. - Right atrium: The atrium was mildly dilated.     HISTORY OF PRESENT ILLNESS Darren Dickensis a 83 y.o.malelast seen well around 3:20 pm on 08/08/2016. He developed a left gaze deviation. Due to this continuing, he presented to the emergency room. He states that his left side feels slightly weaker than typical, but otherwise no associated symptoms. Of note, he has a history of and possible hemorrhage in the past. CT shows a small pontine hemorrhage. ICH Score: 2.  He was admitted to the neuro ICU for further evaluation and treatment.   HOSPITAL COURSE [name] is a 83 y.o. male with history of HTN and previous stroke presenting with left gaze deviation.   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 Remote right CR encephalomalacia. And bilateral remote cerebellar infarcts.  Follow up CT evolving small pontine hemorrhage.    2D Echo  EF 55-60%  LDL 113  HgbA1c - 5.4  aspirin 325 mg daily prior to admission, no antithrombotic at this time due to ICH.  Ongoing aggressive stroke risk factor management  Therapy recommendations:  Inpatient rehabilitation   Disposition:  CIR  Afib on EKG  Not sure if pt has previous diagnosis of afib  Afib captured on tele and EKG  Not candidate for anticoagulation due to current ICH and hx of ICH  History of ICH as per patient and family  Right CR ICH 1997  Associated with left-sided hemiparesis  No significant residual over the years  Hypertension  Stable  Initially treated with cardene, now off, BP remains stable  Initial BP goal < 140, BP goal at discharge < 180  On Ziac at home. Increased bisoprolol dose to 10 mg daily. Hold HCTZ for now given elevated Cr.  Long term BP goal normotensive  Hyperlipidemia  Home meds:   No statin  LDL 113, goal < 70  No need to initiate statin treatment at this time  Other Stroke Risk Factors  Advanced age  Hx of stroke - bilateral cerebellum remote lacunar infarct on MRI  Other Active Problems  CKD stage III, Cr 1.63-> 1.39  Leukocytosis 14.8->11.0 (Afebrile) - UA neg.   Delirium: mild, improving    DISCHARGE EXAM Blood pressure 131/75, pulse 82, temperature 98.2 F (36.8 C), temperature source Oral, resp. rate 18, height 5\' 10"  (1.778 m), weight 81.5 kg (179 lb 10.8 oz), SpO2 99 %. General - Well nourished, well developed, in no apparent distress.  Ophthalmologic - Fundi not visualized due to eye movement.  Cardiovascular - Regular rate and rhythm.  Mental Status -  Level of arousal and orientation only to  person were intact. Language including expression,  naming, repetition, comprehension was assessed and found intact, mild dysarthria  Cranial Nerves II - XII - II - Visual field intact OU. V - Facial sensation intact bilaterally. VII - mild left facial droop. VIII - Hearing & vestibular intact bilaterally. X - Palate elevates symmetrically, mild dysarthria. XI - Chin turning & shoulder shrug intact bilaterally. XII - Tongue protrusion intact.  Motor Strength - The patient's strength was normal in all extremities and pronator drift was absent.  Bulk was normal and fasciculations were absent.   Motor Tone - Muscle tone was assessed at the neck and appendages and was normal.  Reflexes - The patient's reflexes were 1+ in all extremities and he had no pathological reflexes.  Sensory - Light touch, temperature/pinprick were assessed and were symmetrical.    Coordination - The patient had normal movements in the hands with no ataxia or dysmetria.  Tremor was absent.  Gait and Station - deferred.  Discharge Diet  Diet Heart Room service appropriate? Yes; Fluid consistency: Thin liquids  DISCHARGE PLAN  Disposition:  [facility] for ongoing PT, OT and ST  No antithrombotics due to cerebral hemorrhage and risk of bleeding  Recommend ongoing risk factor control by Primary Care Physician at time of discharge from inpatient rehabilitation.  Follow-up GRISSO,GREG, MD in 2 weeks following discharge from rehab.  Follow-up with Dr. Delia HeadyPramod Sethi, Stroke Clinic in 6 weeks, office to schedule an appointment.   45 minutes were spent preparing discharge.  Rhoderick MoodyBIBY,SHARON  [facility] See Amion for Pager information 08/12/2016 4:31 PM  I have personally examined this patient, reviewed notes, independently viewed imaging studies, participated in medical decision making and plan of care.ROS completed by me personally and pertinent positives fully documented  I have made any additions or clarifications directly to the above note. Agree with note above.   Delia HeadyPramod Sethi, MD Medical Director [facility] Pager: [phone] 08/12/2016 5:26 PM

## 2016-08-12 NOTE — PMR Pre-admission (Signed)
PMR Admission Coordinator Pre-Admission Assessment  Patient: [name] is an 83 y.o., male MRN: [id number] DOB: 1930/07/12 Height: 5\' 10"  (177.8 cm) Weight: 81.5 kg (179 lb 10.8 oz)              Insurance Information HMO: No    PPO:       PCP:       IPA:       80/20:       OTHER:   PRIMARY:  Medicare A/B      Policy#: [id number]      Subscriber: [name] CM Name:        Phone#:       Fax#:   Pre-Cert#:        Employer:  Retired Benefits:  Phone #:       Name: Checked in [place] Date: 01/08/98     Deduct:  $1340      Out of Pocket Max:  none      Life Max: unlimited CIR: 100%      SNF: 100 days Outpatient: 80%     Co-Pay: 20% Home Health: 100%      Co-Pay: none DME: 80%     Co-Pay: 20% Providers: patient's choice  SECONDARY: [employer]      Policy#: [id number]      Subscriber:  [name] CM Name:        Phone#:       Fax#:   Pre-Cert#:        Employer: Retired Benefits:  Phone #: [phone]     Name:   Eff. Date:       Deduct:        Out of Pocket Max:        Life Max:   CIR:        SNF:   Outpatient:       Co-Pay:   Home Health:        Co-Pay:   DME:       Co-Pay:    Emergency Contact Information Contact Information    Name Relation Home Work Mobile   [place],[name]  [phone]  [phone]     Current Medical History  Patient Admitting Diagnosis:  Right pons hemorrhage   History of Present Illness: An 83 y.o. male with history of HTN, PAF, CVA '97, Parkinson's disease, Knee OA,  who was admitted on 08/08/16 with left  sided weakness and left gaze preference. CT head reviewed, showing small right brainstem bleed. Per report, 11 X 10 X 18 mm hemorrhage in right pons and chronic ischemic injury with white matter gliosis and remote right lenticulostriate and right cerebellar strokes.  Stroke felt to be hypertensive in origin and follow up CCT stable. Workup on going per Neurology. PT evaluation done revealing significant posterior lean, right inattention  with imbalance and cognitive deficits with deficits in problem solving. CIR recommended for follow up therapy.     Total: 4=NIH  Past Medical History  Past Medical History:  Diagnosis Date  . Hypertension   . Stroke [facility](HCC)     Family History  family history includes Cancer in his father and sister; Stomach cancer in his mother.  Prior Rehab/Hospitalizations: Had a CVA 20 yrs ago and was in [facility] in [place] for about 2 months after CVA  Has the patient had major surgery during 100 days prior to admission? No  Current Medications   Current Facility-Administered Medications:  .  acetaminophen (TYLENOL) tablet 650 mg, 650 mg, Oral,  Q4H PRN, 650 mg at 08/12/16 0324 **OR** acetaminophen (TYLENOL) solution 650 mg, 650 mg, Per Tube, Q4H PRN **OR** acetaminophen (TYLENOL) suppository 650 mg, 650 mg, Rectal, Q4H PRN, Rejeana Brock, MD .  bisoprolol (ZEBETA) tablet 10 mg, 10 mg, Oral, Daily, David L Rinehuls, PA-C, 10 mg at 08/12/16 1610 .  labetalol (NORMODYNE,TRANDATE) injection 10 mg, 10 mg, Intravenous, Q10 min PRN, Layne Benton, NP .  pantoprazole (PROTONIX) EC tablet 40 mg, 40 mg, Oral, QHS, Marvel Plan, MD, 40 mg at 08/10/16 2241 .  senna-docusate (Senokot-S) tablet 1 tablet, 1 tablet, Oral, BID, Rejeana Brock, MD, 1 tablet at 08/12/16 9604  Patients Current Diet: Diet Heart Room service appropriate? Yes; Fluid consistency: Thin  Precautions / Restrictions Precautions Precautions: Fall Restrictions Weight Bearing Restrictions: No   Has the patient had 2 or more falls or a fall with injury in the past year?Yes.  Reports 2 falls with bruising on his arms.  Prior Activity Level Limited Community (1-2x/wk): Went out 3-4 X a week.  Wife/son drives.  Home Assistive Devices / Equipment Home Assistive Devices/Equipment: Wheelchair, Environmental consultant (specify type) (front wheel walker) Home Equipment: Walker - 2 wheels  Prior Device Use: Indicate devices/aids used by the patient  prior to current illness, exacerbation or injury? None  Prior Functional Level Prior Function Level of Independence: Independent Comments: Pt reports he recently stopped driving due to decreaesed reflexes and not feeling safe   Self Care: Did the patient need help bathing, dressing, using the toilet or eating?  Independent  Indoor Mobility: Did the patient need assistance with walking from room to room (with or without device)? Independent  Stairs: Did the patient need assistance with internal or external stairs (with or without device)? Independent  Functional Cognition: Did the patient need help planning regular tasks such as shopping or remembering to take medications? Independent  Current Functional Level Cognition  Arousal/Alertness: Awake/alert Overall Cognitive Status: Impaired/Different from baseline Current Attention Level: Selective Orientation Level: Oriented X4, Oriented to person, Oriented to place, Oriented to time, Oriented to situation Following Commands: Follows multi-step commands inconsistently Safety/Judgement: Decreased awareness of safety, Decreased awareness of deficits General Comments: Pt able to recall me from Friday.  He demonstrates intermittent confusion.  Min cues for problem solving and safety  Attention: Focused Focused Attention: Impaired Focused Attention Impairment: Verbal basic, Functional basic Memory: Impaired Memory Impairment: Decreased recall of new information Awareness: Impaired Awareness Impairment: Intellectual impairment Problem Solving: Impaired Problem Solving Impairment: Verbal basic, Functional basic Executive Function: Sequencing, Initiating, Self Monitoring, Self Correcting, Decision Making, Reasoning, Organizing Reasoning: Impaired Reasoning Impairment: Verbal basic, Functional basic Sequencing: Impaired Sequencing Impairment: Verbal basic, Functional basic Organizing: Impaired Organizing Impairment: Verbal basic, Functional  basic Decision Making: Impaired Decision Making Impairment: Verbal basic, Functional basic Initiating: Impaired Initiating Impairment: Verbal basic, Functional basic Self Monitoring: Impaired Self Monitoring Impairment: Verbal basic, Functional basic Self Correcting: Impaired Self Correcting Impairment: Verbal basic, Functional basic Behaviors: Lability Safety/Judgment: Impaired    Extremity Assessment (includes Sensation/Coordination)  Upper Extremity Assessment: LUE deficits/detail LUE Deficits / Details: Pt with h/o mild Lt UE weakness from previous CVA.  He reports he is unable to write with Lt UE   Lower Extremity Assessment: Defer to PT evaluation LLE Deficits / Details: 4/5 strength throughout all LLE  LLE Sensation:  (WNL)    ADLs  Overall ADL's : Needs assistance/impaired Eating/Feeding: Set up, Bed level, Sitting Grooming: Wash/dry face, Wash/dry hands, Oral care, Brushing hair, Minimal assistance, Sitting Upper Body Bathing:  Minimal assistance, Sitting Lower Body Bathing: Moderate assistance, Sit to/from stand Upper Body Dressing : Moderate assistance, Sitting Lower Body Dressing: Moderate assistance, Sit to/from stand Lower Body Dressing Details (indicate cue type and reason): Pt requires mod A to don and doff socks and mod A for standing balance  Toilet Transfer: Moderate assistance, Stand-pivot, BSC Toileting- Clothing Manipulation and Hygiene: Moderate assistance, Sit to/from stand Functional mobility during ADLs: Moderate assistance, Rolling walker General ADL Comments: wife present     Mobility  Overal bed mobility: Needs Assistance Bed Mobility: Supine to Sit Supine to sit: Min guard General bed mobility comments: requires inreased time     Transfers  Overall transfer level: Needs assistance Equipment used: 1 person hand held assist Transfers: Stand Pivot Transfers, Sit to/from Stand Sit to Stand: Min assist Stand pivot transfers: Mod assist General  transfer comment: assist for balance and to facilitate weigh shift     Ambulation / Gait / Stairs / Wheelchair Mobility  Ambulation/Gait Ambulation/Gait assistance: +2 physical assistance, Max assist Ambulation Distance (Feet): 3 Feet Assistive device: 2 person hand held assist Gait Pattern/deviations: Step-to pattern, Leaning posteriorly, Narrow base of support, Decreased step length - right, Decreased step length - left General Gait Details: Amb forward/backward a few steps to chair with maxA +2 due to significant posterior/left lean which pt is unable to self-correct.  Gait velocity: Decreased    Posture / Balance Dynamic Sitting Balance Sitting balance - Comments: pt requires min guard assist for static sitting and min A for dynamic sitting.  Worked on Reaching with Rt UE to faciliatate trunk extension/control and weighshift to the Rt   Balance Overall balance assessment: Needs assistance Sitting-balance support: Feet supported, Bilateral upper extremity supported Sitting balance-Leahy Scale: Fair Sitting balance - Comments: pt requires min guard assist for static sitting and min A for dynamic sitting.  Worked on Reaching with Rt UE to faciliatate trunk extension/control and weighshift to the Rt   Postural control: Left lateral lean, Posterior lean Standing balance support: Bilateral upper extremity supported Standing balance-Leahy Scale: Poor Standing balance comment: Pt reliant on bil. UE support and min - mod A     Special needs/care consideration BiPAP/CPAP No CPM No Continuous Drip IV No Dialysis No       Life Vest No Oxygen  Special Bed No Trach Size No Wound Vac (area) No      Skin Dry skin and bruises on arms                              Bowel mgmt: Last BM 08/11/16 Bladder mgmt: Condom catheter, incontinence Diabetic mgmt No    Previous Home Environment Living Arrangements: Spouse/significant other  Lives With: Spouse Available Help at Discharge: Family, Available  24 hours/day Type of Home: House Home Layout: One level Home Access: Stairs to enter Secretary/administrator of Steps: 1 Bathroom Shower/Tub: Tub/shower unit, Engineer, building services: Standard Home Care Services: No Additional Comments: Pt lives with wife, and one son.  the other son lives in an RV behind their house   Discharge Living Setting Plans for Discharge Living Setting: Patient's home, House, Lives with (comment) (Lives with wife and son, [name].) Type of Home at Discharge: House Discharge Home Layout: Two level, Laundry or work area in basement, Able to live on main level with bedroom/bathroom Alternate Level Stairs-Number of Steps: 20 steps down to basement, but patient does not need to go to basement. Discharge Home  Access: Stairs to enter [employer] of Steps: 2 Does the patient have any problems obtaining your medications?: No  Social/Family/Support Systems Patient Roles: Spouse, Parent (Has a wife and 2 sons.) Contact Information: [name] - wife Anticipated Caregiver: wife and son Anticipated Caregiver's Contact Information: [name] - wife - (h) [phone]) [phone] Ability/Limitations of Caregiver: Wife can provide supervision.  Son can provide physical assist for at least 1 month as he has FMLA in place Caregiver Availability: 24/7 Discharge Plan Discussed with Primary Caregiver: Yes Is Caregiver In Agreement with Plan?: Yes Does Caregiver/Family have Issues with Lodging/Transportation while Pt is in Rehab?: No  Goals/Additional Needs Patient/Family Goal for Rehab: PT min assist, OT supervision to min assist, SLP mod I and supervision goals Expected length of stay: 18-21 days Cultural Considerations: [employer] Dietary Needs: Heart diet, thin liquids Equipment Needs: TBD Pt/Family Agrees to Admission and willing to participate: Yes Program Orientation Provided & Reviewed with Pt/Caregiver Including Roles  & Responsibilities: Yes  Decrease  burden of Care through IP rehab admission: N/A  Possible need for SNF placement upon discharge: Not planned  Patient Condition: This patient's medical and functional status has changed since the consult dated: 08/09/16 in which the Rehabilitation Physician determined and documented that the patient's condition is appropriate for intensive rehabilitative care in an inpatient rehabilitation facility. See "History of Present Illness" (above) for medical update. Functional changes are: Currently requiring max assist to ambulate 3 feet.  Wife and son can provide care after rehab discharge.  All neuro workup is now completed.   Patient's medical and functional status update has been discussed with the Rehabilitation physician and patient remains appropriate for inpatient rehabilitation. Will admit to inpatient rehab today.  Preadmission Screen Completed By:  Trish Mage, 08/12/2016 12:17 PM ______________________________________________________________________   Discussed status with Dr. Allena Katz on 08/12/16 at 1217 and received telephone approval for admission today.  Admission Coordinator:  Trish Mage, time1217/Date03/05/18

## 2016-08-12 NOTE — H&P (Signed)
Physical Medicine and Rehabilitation Admission H&P    Chief Complaint  Patient presents with  . Left sided weakness, left gaze deviation and cognitive deficits    HPI: [name] is a 83 y.o. LH-male with history of HTN, PAF, CVA '97, Parkinson's disease, Knee OA,  who was admitted on 08/08/16 with left  sided weakness and left gaze preference. CT head reviewed, showing small right brainstem bleed. Per report, 11 X 10 X 18 mm hemorrhage in right pons and chronic ischemic injury with white matter gliosis and remote right lenticulostriate and right cerebellar strokes.  Stroke felt to be hypertensive in origin and follow up CCT stable. He has had issues with confusion and hallucinations.   Speech therapy evaluation revealed decreased initiation, delayed processing, poor safety with severe cognitive impairments, lability and dysarthria. CIR recommended for follow up therapy. Therapy initiated and patient with significant posterior lean, right inattention with imbalance and inability to carry out ADL tasks. CIR recommended for follow up therapy.    Review of Systems  HENT: Positive for hearing loss. Negative for ear pain and tinnitus.   Eyes: Positive for blurred vision and double vision.  Respiratory: Negative for cough and sputum production.   Cardiovascular: Negative for chest pain and palpitations.  Gastrointestinal: Positive for constipation and heartburn.  Genitourinary: Positive for frequency and urgency. Negative for dysuria.       Urge incontinenct  Musculoskeletal: Positive for back pain and myalgias.  Neurological: Positive for focal weakness and weakness. Negative for sensory change.  Psychiatric/Behavioral: Positive for memory loss. Negative for depression.  All other systems reviewed and are negative.     Past Medical History:  Diagnosis Date  . Hypertension   . Stroke [facility])     Past Surgical History:  Procedure Laterality Date  . HERNIA REPAIR        Family  History  Problem Relation Age of Onset  . Stomach cancer Mother   . Cancer Father   . Cancer Sister     Social History:  Married. Retired--used to work for [employer]. Independent without AD. sons in town check in on them. He does not use tobacco, alcohol or illicit drugs.     Allergies: No Known Allergies    Medications Prior to Admission  Medication Sig Dispense Refill  . aspirin EC 325 MG tablet Take 325 mg by mouth daily.    . bisoprolol-hydrochlorothiazide (ZIAC) 5-6.25 MG tablet Take 1 tablet by mouth daily.    Marland Kitchen docusate sodium (COLACE) 100 MG capsule Take 200 mg by mouth 2 (two) times daily.      Home: Home Living Family/patient expects to be discharged to:: Private residence Living Arrangements: Spouse/significant other Available Help at Discharge: Family, Available 24 hours/day Type of Home: House Home Access: Stairs to enter [employer] of Steps: 1 Home Layout: One level Bathroom Shower/Tub: Tub/shower unit, Architectural technologist: Standard Home Equipment: Environmental consultant - 2 wheels Additional Comments: Pt lives with wife, and one son.  the other son lives in an RV behind their house   Lives With: Spouse   Functional History: Prior Function Level of Independence: Independent Comments: Pt reports he recently stopped driving due to decreaesed reflexes and not feeling safe   Functional Status:  Mobility: Bed Mobility Overal bed mobility: Needs Assistance Bed Mobility: Supine to Sit Supine to sit: Min guard General bed mobility comments: requires inreased time  Transfers Overall transfer level: Needs assistance Equipment used: 1 person hand held assist Transfers: Stand  Pivot Transfers, Sit to/from Stand Sit to Stand: Min assist Stand pivot transfers: Mod assist General transfer comment: assist for balance and to facilitate weigh shift  Ambulation/Gait Ambulation/Gait assistance: +2 physical assistance, Max assist Ambulation Distance (Feet):  3 Feet Assistive device: 2 person hand held assist Gait Pattern/deviations: Step-to pattern, Leaning posteriorly, Narrow base of support, Decreased step length - right, Decreased step length - left General Gait Details: Amb forward/backward a few steps to chair with maxA +2 due to significant posterior/left lean which pt is unable to self-correct.  Gait velocity: Decreased    ADL: ADL Overall ADL's : Needs assistance/impaired Eating/Feeding: Set up, Bed level, Sitting Grooming: Wash/dry face, Wash/dry hands, Oral care, Brushing hair, Minimal assistance, Sitting Upper Body Bathing: Minimal assistance, Sitting Lower Body Bathing: Moderate assistance, Sit to/from stand Upper Body Dressing : Moderate assistance, Sitting Lower Body Dressing: Moderate assistance, Sit to/from stand Lower Body Dressing Details (indicate cue type and reason): Pt requires mod A to don and doff socks and mod A for standing balance  Toilet Transfer: Moderate assistance, Stand-pivot, BSC Toileting- Clothing Manipulation and Hygiene: Moderate assistance, Sit to/from stand Functional mobility during ADLs: Moderate assistance, Rolling walker General ADL Comments: wife present   Cognition: Cognition Overall Cognitive Status: Impaired/Different from baseline Arousal/Alertness: Awake/alert Orientation Level: Oriented X4, Oriented to person, Oriented to place, Oriented to time, Oriented to situation Attention: Focused Focused Attention: Impaired Focused Attention Impairment: Verbal basic, Functional basic Memory: Impaired Memory Impairment: Decreased recall of new information Awareness: Impaired Awareness Impairment: Intellectual impairment Problem Solving: Impaired Problem Solving Impairment: Verbal basic, Functional basic Executive Function: Sequencing, Initiating, Self Monitoring, Self Correcting, Decision Making, Reasoning, Organizing Reasoning: Impaired Reasoning Impairment: Verbal basic, Functional  basic Sequencing: Impaired Sequencing Impairment: Verbal basic, Functional basic Organizing: Impaired Organizing Impairment: Verbal basic, Functional basic Decision Making: Impaired Decision Making Impairment: Verbal basic, Functional basic Initiating: Impaired Initiating Impairment: Verbal basic, Functional basic Self Monitoring: Impaired Self Monitoring Impairment: Verbal basic, Functional basic Self Correcting: Impaired Self Correcting Impairment: Verbal basic, Functional basic Behaviors: Lability Safety/Judgment: Impaired Cognition Arousal/Alertness: Awake/alert Behavior During Therapy: WFL for tasks assessed/performed Overall Cognitive Status: Impaired/Different from baseline Area of Impairment: Attention, Following commands, Safety/judgement, Problem solving Current Attention Level: Selective Following Commands: Follows multi-step commands inconsistently Safety/Judgement: Decreased awareness of safety, Decreased awareness of deficits Awareness: Emergent Problem Solving: Difficulty sequencing, Requires verbal cues General Comments: Pt able to recall me from Friday.  He demonstrates intermittent confusion.  Min cues for problem solving and safety     Blood pressure 131/75, pulse 82, temperature 98.2 F (36.8 C), temperature source Oral, resp. rate 18, height '5\' 10"'  (1.778 m), weight 81.5 kg (179 lb 10.8 oz), SpO2 99 %. Physical Exam  Nursing note reviewed. Constitutional: He is oriented to person, place, and time. He appears well-developed and well-nourished.  HENT:  Head: Normocephalic and atraumatic.  Mouth/Throat: Oropharynx is clear and moist.  Eyes: Conjunctivae are normal. Pupils are equal, round, and reactive to light.  Neck: Normal range of motion. Neck supple.  Cardiovascular:  Occasional extrasystoles are present. Tachycardia present.   Respiratory: Effort normal and breath sounds normal. No stridor. No respiratory distress. He has no wheezes.  GI: Soft. Bowel  sounds are normal. He exhibits no distension. There is no tenderness.  Musculoskeletal: He exhibits no edema or tenderness.  Neurological: He is alert and oriented to person, place, and time. Coordination abnormal.  DTRs symmetric Sensation intact to light touch Motor: 4+/5-5 throughout Left gaze preference  Skin: Skin is warm  and dry.  Dry skin bilateral shins. Ecchymotic areas bilateral forearms.   Psychiatric: He has a normal mood and affect. His behavior is normal.    Results for orders placed or performed during the hospital encounter of 08/08/16 (from the past 48 hour(s))  CBC     Status: Abnormal   Collection Time: 08/11/16  2:00 AM  Result Value Ref Range   WBC 11.0 (H) 4.0 - 10.5 K/uL   RBC 4.55 4.22 - 5.81 MIL/uL   Hemoglobin 14.6 13.0 - 17.0 g/dL   HCT 42.0 39.0 - 52.0 %   MCV 92.3 78.0 - 100.0 fL   MCH 32.1 26.0 - 34.0 pg   MCHC 34.8 30.0 - 36.0 g/dL   RDW 14.0 11.5 - 15.5 %   Platelets 184 150 - 400 K/uL  Basic metabolic panel     Status: Abnormal   Collection Time: 08/11/16  2:00 AM  Result Value Ref Range   Sodium 139 135 - 145 mmol/L   Potassium 3.7 3.5 - 5.1 mmol/L   Chloride 106 101 - 111 mmol/L   CO2 23 22 - 32 mmol/L   Glucose, Bld 109 (H) 65 - 99 mg/dL   BUN 16 6 - 20 mg/dL   Creatinine, Ser 1.39 (H) 0.61 - 1.24 mg/dL   Calcium 8.7 (L) 8.9 - 10.3 mg/dL   GFR calc non Af Amer 45 (L) >60 mL/min   GFR calc Af Amer 52 (L) >60 mL/min    Comment: (NOTE) The eGFR has been calculated using the CKD EPI equation. This calculation has not been validated in all clinical situations. eGFR's persistently <60 mL/min signify possible Chronic Kidney Disease.    Anion gap 10 5 - 15   No results found.     Medical Problem List and Plan: 1.  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  2.  DVT Prophylaxis/Anticoagulation: Pharmaceutical: Lovenox 3. Pain Management: Tylenol prn for knee pain 4. Mood: LCSW to  follow for evaluation and support.  5. Neuropsych: This patient is not fully capable of making decisions on his own behalf. 6. Skin/Wound Care: routine pressure relief  7. Fluids/Electrolytes/Nutrition: Monitor I/O. Check lytes in am.  8. PAF: Monitor HR bid.  9. HTN: Monitor BP. Cont meds, titrate as necessary 10. Knee OA: Meds as necessary 11. Parkinson's disease: Cont to monitor 12. Leukocytosis: Follow CBC 13. AKI vs. CKD: Follow BMP  Post Admission Physician Evaluation: 1. Preadmission assessment reviewed and changes made below. 2. Functional deficits secondary  to right pons hemorrhage. 3. Patient is admitted to receive collaborative, interdisciplinary care between the physiatrist, rehab nursing staff, and therapy team. 4. Patient's level of medical complexity and substantial therapy needs in context of that medical necessity cannot be provided at a lesser intensity of care such as a SNF. 5. Patient has experienced substantial functional loss from his/her baseline which was documented above under the "Functional History" and "Functional Status" headings.  Judging by the patient's diagnosis, physical exam, and functional history, the patient has potential for functional progress which will result in measurable gains while on inpatient rehab.  These gains will be of substantial and practical use upon discharge  in facilitating mobility and self-care at the household level. 6. Physiatrist will provide 24 hour management of medical needs as well as oversight of the therapy plan/treatment and provide guidance as appropriate regarding the interaction of the two. 7. The Preadmission Screening has been reviewed and patient status is  unchanged unless otherwise stated above. 8. 24 hour rehab nursing will assist with bladder management, safety, disease management, pain management and patient education  and help integrate therapy concepts, techniques,education, etc. 9. PT will assess and treat  for/with: Lower extremity strength, range of motion, stamina, balance, functional mobility, safety, adaptive techniques and equipment, coping skills, pain control, education.   Goals are: Supervision. 10. OT will assess and treat for/with: ADL's, functional mobility, safety, upper extremity strength, adaptive techniques and equipment, ego support, and community reintegration.   Goals are: Supervision/Mod I. Therapy may proceed with showering this patient. 11. Case Management and Social Worker will assess and treat for psychological issues and discharge planning. 12. Team conference will be held weekly to assess progress toward goals and to determine barriers to discharge. 13. Patient will receive at least 3 hours of therapy per day at least 5 days per week. 14. ELOS: 9-14 days.       15. Prognosis:  good   Delice Lesch, MD, [address], [place] 08/12/2016

## 2016-08-12 NOTE — Progress Notes (Signed)
Patient arrived on rehab around 1510, assigned to room 4W08, accompanied by his wife.

## 2016-08-13 ENCOUNTER — Inpatient Hospital Stay (HOSPITAL_COMMUNITY): Payer: Medicare Other | Admitting: Physical Therapy

## 2016-08-13 ENCOUNTER — Inpatient Hospital Stay (HOSPITAL_COMMUNITY): Payer: Medicare Other | Admitting: Speech Pathology

## 2016-08-13 ENCOUNTER — Inpatient Hospital Stay (HOSPITAL_COMMUNITY): Payer: Medicare Other | Admitting: Occupational Therapy

## 2016-08-13 ENCOUNTER — Inpatient Hospital Stay (HOSPITAL_COMMUNITY): Payer: Medicare Other

## 2016-08-13 DIAGNOSIS — D72825 Bandemia: Secondary | ICD-10-CM

## 2016-08-13 DIAGNOSIS — I613 Nontraumatic intracerebral hemorrhage in brain stem: Secondary | ICD-10-CM

## 2016-08-13 LAB — COMPREHENSIVE METABOLIC PANEL
ALK PHOS: 49 U/L (ref 38–126)
ALT: 16 U/L — AB (ref 17–63)
AST: 34 U/L (ref 15–41)
Albumin: 3.2 g/dL — ABNORMAL LOW (ref 3.5–5.0)
Anion gap: 11 (ref 5–15)
BILIRUBIN TOTAL: 1.2 mg/dL (ref 0.3–1.2)
BUN: 40 mg/dL — AB (ref 6–20)
CHLORIDE: 104 mmol/L (ref 101–111)
CO2: 24 mmol/L (ref 22–32)
CREATININE: 3.95 mg/dL — AB (ref 0.61–1.24)
Calcium: 9.3 mg/dL (ref 8.9–10.3)
GFR, EST AFRICAN AMERICAN: 15 mL/min — AB (ref >60)
GFR, EST NON AFRICAN AMERICAN: 13 mL/min — AB (ref >60)
Glucose, Bld: 128 mg/dL — ABNORMAL HIGH (ref 65–99)
Potassium: 5.1 mmol/L (ref 3.5–5.1)
Sodium: 139 mmol/L (ref 135–145)
Total Protein: 6.5 g/dL (ref 6.5–8.1)

## 2016-08-13 LAB — URINALYSIS, ROUTINE W REFLEX MICROSCOPIC
Bilirubin Urine: NEGATIVE
GLUCOSE, UA: NEGATIVE mg/dL
Ketones, ur: NEGATIVE mg/dL
NITRITE: NEGATIVE
Protein, ur: 30 mg/dL — AB
Specific Gravity, Urine: 1.015 (ref 1.005–1.030)
Squamous Epithelial / LPF: NONE SEEN
pH: 5 (ref 5.0–8.0)

## 2016-08-13 LAB — CBC WITH DIFFERENTIAL/PLATELET
BASOS PCT: 0 %
Basophils Absolute: 0 10*3/uL (ref 0.0–0.1)
EOS ABS: 0.1 10*3/uL (ref 0.0–0.7)
EOS PCT: 0 %
HCT: 43.7 % (ref 39.0–52.0)
Hemoglobin: 15.2 g/dL (ref 13.0–17.0)
LYMPHS ABS: 1.1 10*3/uL (ref 0.7–4.0)
Lymphocytes Relative: 5 %
MCH: 32.2 pg (ref 26.0–34.0)
MCHC: 34.8 g/dL (ref 30.0–36.0)
MCV: 92.6 fL (ref 78.0–100.0)
Monocytes Absolute: 2.1 10*3/uL — ABNORMAL HIGH (ref 0.1–1.0)
Monocytes Relative: 10 %
Neutro Abs: 16.7 10*3/uL — ABNORMAL HIGH (ref 1.7–7.7)
Neutrophils Relative %: 85 %
PLATELETS: 180 10*3/uL (ref 150–400)
RBC: 4.72 MIL/uL (ref 4.22–5.81)
RDW: 14.1 % (ref 11.5–15.5)
WBC: 19.9 10*3/uL — AB (ref 4.0–10.5)

## 2016-08-13 MED ORDER — ENOXAPARIN SODIUM 30 MG/0.3ML ~~LOC~~ SOLN
30.0000 mg | SUBCUTANEOUS | Status: DC
  Administered 2016-08-13 – 2016-08-18 (×6): 30 mg via SUBCUTANEOUS
  Filled 2016-08-13 (×6): qty 0.3

## 2016-08-13 NOTE — Progress Notes (Signed)
Trish Mage, RN Rehab Admission Coordinator Signed Physical Medicine and Rehabilitation  PMR Pre-admission Date of Service: 08/12/2016 12:05 PM  Related encounter: ED to Hosp-Admission (Discharged) from 08/08/2016 in [facility] 5 CENTRAL NEURO SURGICAL       [] Hide copied text PMR Admission Coordinator Pre-Admission Assessment  Patient: [name] is an 83 y.o., male MRN: [id number] DOB: 03-Apr-1931 Height: 5\' 10"  (177.8 cm) Weight: 81.5 kg (179 lb 10.8 oz)                                                                                                                                                  Insurance Information HMO: No    PPO:       PCP:       IPA:       80/20:       OTHER:   PRIMARY:  Medicare A/B      Policy#: [id number]      Subscriber: Para Skeans CM Name:        Phone#:       Fax#:   Pre-Cert#:        Employer:  Retired Benefits:  Phone #:       Name: Checked in Thomas. Date: 01/08/98     Deduct:  $1340      Out of Pocket Max:  none      Life Max: unlimited CIR: 100%      SNF: 100 days Outpatient: 80%     Co-Pay: 20% Home Health: 100%      Co-Pay: none DME: 80%     Co-Pay: 20% Providers: patient's choice  SECONDARY: [employer]      Policy#: [id number]      Subscriber:  Para Skeans CM Name:        Phone#:       Fax#:   Pre-Cert#:        Employer: Retired Benefits:  Phone #: [phone]     Name:   Eff. Date:       Deduct:        Out of Pocket Max:        Life Max:   CIR:        SNF:   Outpatient:       Co-Pay:   Home Health:        Co-Pay:   DME:       Co-Pay:    Emergency Contact Information        Contact Information    Name Relation Home Work Mobile   [place]  [phone]  [phone]     Current Medical History  Patient Admitting Diagnosis: Right pons hemorrhage   History of Present Illness:An 83 y.o.malewith history of HTN, PAF, CVA '97, Parkinson's disease, Knee OA, who was admitted on 08/08/16  with left sided weakness and left  gaze preference. CT head reviewed, showing small right brainstem bleed. Per report, 11 X 10 X 18 mm hemorrhage in right pons and chronic ischemic injury with white matter gliosis and remote right lenticulostriate and right cerebellar strokes. Stroke felt to be hypertensive in origin and follow up CCT stable. Workup on going per Neurology. PT evaluation done revealing significant posterior lean, right inattention with imbalance and cognitive deficits with deficits in problem solving. CIR recommended for follow up therapy.     Total: 4=NIH  Past Medical History      Past Medical History:  Diagnosis Date  . Hypertension   . Stroke [facility])     Family History  family history includes Cancer in his father and sister; Stomach cancer in his mother.  Prior Rehab/Hospitalizations: Had a CVA 20 yrs ago and was in [facility] in [place] for about 2 months after CVA  Has the patient had major surgery during 100 days prior to admission? No  Current Medications   Current Facility-Administered Medications:  .  acetaminophen (TYLENOL) tablet 650 mg, 650 mg, Oral, Q4H PRN, 650 mg at 08/12/16 0324 **OR** acetaminophen (TYLENOL) solution 650 mg, 650 mg, Per Tube, Q4H PRN **OR** acetaminophen (TYLENOL) suppository 650 mg, 650 mg, Rectal, Q4H PRN, Rejeana Brock, MD .  bisoprolol (ZEBETA) tablet 10 mg, 10 mg, Oral, Daily, David L Rinehuls, PA-C, 10 mg at 08/12/16 0981 .  labetalol (NORMODYNE,TRANDATE) injection 10 mg, 10 mg, Intravenous, Q10 min PRN, Layne Benton, NP .  pantoprazole (PROTONIX) EC tablet 40 mg, 40 mg, Oral, QHS, Marvel Plan, MD, 40 mg at 08/10/16 2241 .  senna-docusate (Senokot-S) tablet 1 tablet, 1 tablet, Oral, BID, Rejeana Brock, MD, 1 tablet at 08/12/16 1914  Patients Current Diet: Diet Heart Room service appropriate? Yes; Fluid consistency: Thin  Precautions / Restrictions Precautions Precautions: Fall Restrictions Weight  Bearing Restrictions: No   Has the patient had 2 or more falls or a fall with injury in the past year?Yes.  Reports 2 falls with bruising on his arms.  Prior Activity Level Limited Community (1-2x/wk): Went out 3-4 X a week.  Wife/son drives.  Home Assistive Devices / Equipment Home Assistive Devices/Equipment: Wheelchair, Environmental consultant (specify type) (front wheel walker) Home Equipment: Walker - 2 wheels  Prior Device Use: Indicate devices/aids used by the patient prior to current illness, exacerbation or injury? None  Prior Functional Level Prior Function Level of Independence: Independent Comments: Pt reports he recently stopped driving due to decreaesed reflexes and not feeling safe   Self Care: Did the patient need help bathing, dressing, using the toilet or eating?  Independent  Indoor Mobility: Did the patient need assistance with walking from room to room (with or without device)? Independent  Stairs: Did the patient need assistance with internal or external stairs (with or without device)? Independent  Functional Cognition: Did the patient need help planning regular tasks such as shopping or remembering to take medications? Independent  Current Functional Level Cognition  Arousal/Alertness: Awake/alert Overall Cognitive Status: Impaired/Different from baseline Current Attention Level: Selective Orientation Level: Oriented X4, Oriented to person, Oriented to place, Oriented to time, Oriented to situation Following Commands: Follows multi-step commands inconsistently Safety/Judgement: Decreased awareness of safety, Decreased awareness of deficits General Comments: Pt able to recall me from Friday.  He demonstrates intermittent confusion.  Min cues for problem solving and safety  Attention: Focused Focused Attention: Impaired Focused Attention Impairment: Verbal basic, Functional basic Memory: Impaired Memory Impairment: Decreased recall of new  information Awareness: Impaired  Awareness Impairment: Intellectual impairment Problem Solving: Impaired Problem Solving Impairment: Verbal basic, Functional basic Executive Function: Sequencing, Initiating, Self Monitoring, Self Correcting, Decision Making, Reasoning, Organizing Reasoning: Impaired Reasoning Impairment: Verbal basic, Functional basic Sequencing: Impaired Sequencing Impairment: Verbal basic, Functional basic Organizing: Impaired Organizing Impairment: Verbal basic, Functional basic Decision Making: Impaired Decision Making Impairment: Verbal basic, Functional basic Initiating: Impaired Initiating Impairment: Verbal basic, Functional basic Self Monitoring: Impaired Self Monitoring Impairment: Verbal basic, Functional basic Self Correcting: Impaired Self Correcting Impairment: Verbal basic, Functional basic Behaviors: Lability Safety/Judgment: Impaired    Extremity Assessment (includes Sensation/Coordination)  Upper Extremity Assessment: LUE deficits/detail LUE Deficits / Details: Pt with h/o mild Lt UE weakness from previous CVA.  He reports he is unable to write with Lt UE   Lower Extremity Assessment: Defer to PT evaluation LLE Deficits / Details: 4/5 strength throughout all LLE  LLE Sensation:  (WNL)    ADLs  Overall ADL's : Needs assistance/impaired Eating/Feeding: Set up, Bed level, Sitting Grooming: Wash/dry face, Wash/dry hands, Oral care, Brushing hair, Minimal assistance, Sitting Upper Body Bathing: Minimal assistance, Sitting Lower Body Bathing: Moderate assistance, Sit to/from stand Upper Body Dressing : Moderate assistance, Sitting Lower Body Dressing: Moderate assistance, Sit to/from stand Lower Body Dressing Details (indicate cue type and reason): Pt requires mod A to don and doff socks and mod A for standing balance  Toilet Transfer: Moderate assistance, Stand-pivot, BSC Toileting- Clothing Manipulation and Hygiene: Moderate assistance, Sit  to/from stand Functional mobility during ADLs: Moderate assistance, Rolling walker General ADL Comments: wife present     Mobility  Overal bed mobility: Needs Assistance Bed Mobility: Supine to Sit Supine to sit: Min guard General bed mobility comments: requires inreased time     Transfers  Overall transfer level: Needs assistance Equipment used: 1 person hand held assist Transfers: Stand Pivot Transfers, Sit to/from Stand Sit to Stand: Min assist Stand pivot transfers: Mod assist General transfer comment: assist for balance and to facilitate weigh shift     Ambulation / Gait / Stairs / Wheelchair Mobility  Ambulation/Gait Ambulation/Gait assistance: +2 physical assistance, Max assist Ambulation Distance (Feet): 3 Feet Assistive device: 2 person hand held assist Gait Pattern/deviations: Step-to pattern, Leaning posteriorly, Narrow base of support, Decreased step length - right, Decreased step length - left General Gait Details: Amb forward/backward a few steps to chair with maxA +2 due to significant posterior/left lean which pt is unable to self-correct.  Gait velocity: Decreased    Posture / Balance Dynamic Sitting Balance Sitting balance - Comments: pt requires min guard assist for static sitting and min A for dynamic sitting.  Worked on Reaching with Rt UE to faciliatate trunk extension/control and weighshift to the Rt   Balance Overall balance assessment: Needs assistance Sitting-balance support: Feet supported, Bilateral upper extremity supported Sitting balance-Leahy Scale: Fair Sitting balance - Comments: pt requires min guard assist for static sitting and min A for dynamic sitting.  Worked on Reaching with Rt UE to faciliatate trunk extension/control and weighshift to the Rt   Postural control: Left lateral lean, Posterior lean Standing balance support: Bilateral upper extremity supported Standing balance-Leahy Scale: Poor Standing balance comment: Pt reliant on  bil. UE support and min - mod A     Special needs/care consideration BiPAP/CPAP No CPM No Continuous Drip IV No Dialysis No       Life Vest No Oxygen  Special Bed No Trach Size No Wound Vac (area) No      Skin Dry skin and bruises  on arms                              Bowel mgmt: Last BM 08/11/16 Bladder mgmt: Condom catheter, incontinence Diabetic mgmt No    Previous Home Environment Living Arrangements: Spouse/significant other  Lives With: Spouse Available Help at Discharge: Family, Available 24 hours/day Type of Home: House Home Layout: One level Home Access: Stairs to enter [employer] of Steps: 1 Bathroom Shower/Tub: Tub/shower unit, Engineer, building services: Standard Home Care Services: No Additional Comments: Pt lives with wife, and one son.  the other son lives in an RV behind their house   Discharge Living Setting Plans for Discharge Living Setting: Patient's home, House, Lives with (comment) (Lives with wife and son, Dorinda Hill.) Type of Home at Discharge: House Discharge Home Layout: Two level, Laundry or work area in basement, Able to live on main level with bedroom/bathroom Alternate Level Stairs-Number of Steps: 20 steps down to basement, but patient does not need to go to basement. Discharge Home Access: Stairs to enter Entrance Stairs-Number of Steps: 2 Does the patient have any problems obtaining your medications?: No  Social/Family/Support Systems Patient Roles: Spouse, Parent (Has a wife and 2 sons.) Contact Information: Larell Baney - wife Anticipated Caregiver: wife and son Anticipated Caregiver's Contact Information: Coralee North - wife - (h) [phone] (c) [phone] Ability/Limitations of Caregiver: Wife can provide supervision.  Son can provide physical assist for at least 1 month as he has FMLA in place Caregiver Availability: 24/7 Discharge Plan Discussed with Primary Caregiver: Yes Is Caregiver In Agreement with Plan?: Yes Does  Caregiver/Family have Issues with Lodging/Transportation while Pt is in Rehab?: No  Goals/Additional Needs Patient/Family Goal for Rehab: PT min assist, OT supervision to min assist, SLP mod I and supervision goals Expected length of stay: 18-21 days Cultural Considerations: [employer] Dietary Needs: Heart diet, thin liquids Equipment Needs: TBD Pt/Family Agrees to Admission and willing to participate: Yes Program Orientation Provided & Reviewed with Pt/Caregiver Including Roles  & Responsibilities: Yes  Decrease burden of Care through IP rehab admission: N/A  Possible need for SNF placement upon discharge: Not planned  Patient Condition: This patient's medical and functional status has changed since the consult dated: 08/09/16 in which the Rehabilitation Physician determined and documented that the patient's condition is appropriate for intensive rehabilitative care in an inpatient rehabilitation facility. See "History of Present Illness" (above) for medical update. Functional changes are: Currently requiring max assist to ambulate 3 feet.  Wife and son can provide care after rehab discharge.  All neuro workup is now completed.   Patient's medical and functional status update has been discussed with the Rehabilitation physician and patient remains appropriate for inpatient rehabilitation. Will admit to inpatient rehab today.  Preadmission Screen Completed By:  Trish Mage, 08/12/2016 12:17 PM ______________________________________________________________________   Discussed status with Dr. Allena Katz on 08/12/16 at 1217 and received telephone approval for admission today.  Admission Coordinator:  Trish Mage, time1217/Date03/05/18       Cosigned by: Ankit Karis Juba, MD at 08/12/2016 12:42 PM  Revision History

## 2016-08-13 NOTE — Progress Notes (Signed)
Ankit Karis Juba, MD Physician Signed Physical Medicine and Rehabilitation  Consult Note Date of Service: 08/09/2016 2:14 PM  Related encounter: ED to Hosp-Admission (Discharged) from 08/08/2016 in [facility] 5 CENTRAL NEURO SURGICAL     Expand All Collapse All   [] Hide copied text [] Hover for attribution information      Physical Medicine and Rehabilitation Consult   Reason for Consult: Left sided weakness and left gaze deviation Referring Physician: Dr. Roda Shutters   HPI: [name] is a 83 y.o. male with history of HTN, PAF, CVA '97, Parkinson's disease, Knee OA,  who was admitted on 08/08/16 with left  sided weakness and left gaze preference. CT head reviewed, showing small right brainstem bleed. Per report, 11 X 10 X 18 mm hemorrhage in right pons and chronic ischemic injury with white matter gliosis and remote right lenticulostriate and right cerebellar strokes.  Stroke felt to be hypertensive in origin and follow up CCT stable. Workup on going per Neurology. PT evaluation done revealing significant posterior lean, right inattention with imbalance and cognitive deficits with deficits in problem solving. CIR recommended for follow up therapy.    Review of Systems  HENT: Positive for hearing loss. Negative for ear pain and tinnitus.   Eyes: Positive for blurred vision and double vision.  Respiratory: Negative for cough and hemoptysis.   Cardiovascular: Negative for chest pain, palpitations and leg swelling.  Gastrointestinal: Negative for heartburn and nausea.  Genitourinary: Negative for dysuria.  Musculoskeletal: Negative for back pain and myalgias.  Neurological: Positive for sensory change and focal weakness. Negative for dizziness, speech change and headaches.  Psychiatric/Behavioral: Negative for memory loss. The patient is nervous/anxious.   All other systems reviewed and are negative.         Past Medical History:  Diagnosis Date  . Hypertension    . Stroke [facility])     History reviewed. No past surgical history.         Family History  Problem Relation Age of Onset  . Stomach cancer Mother   . Cancer Father   . Cancer Sister       Social History:  Married. Retired--used to work for [employer]. Independent without AD. sons in town check in on them. He does not use tobacco, alcohol or illicit drugs.    Allergies: No Known Allergies          Medications Prior to Admission  Medication Sig Dispense Refill  . aspirin EC 325 MG tablet Take 325 mg by mouth daily.    . bisoprolol-hydrochlorothiazide (ZIAC) 5-6.25 MG tablet Take 1 tablet by mouth daily.    Marland Kitchen docusate sodium (COLACE) 100 MG capsule Take 200 mg by mouth 2 (two) times daily.      Home: Home Living Family/patient expects to be discharged to:: Private residence Living Arrangements: Spouse/significant other Available Help at Discharge: Family, Available 24 hours/day Type of Home: House Home Access: Stairs to enter [employer] of Steps: 1 Home Layout: One level Home Equipment: Environmental consultant - 2 wheels Additional Comments: Lives with wife who is indep and drives; reports she is have Lasix eye surgery today (3/2). Sons live nearby.   Functional History: Prior Function Level of Independence: Independent Comments: Limited driving Functional Status:  Mobility: Bed Mobility Overal bed mobility: Needs Assistance Bed Mobility: Supine to Sit Supine to sit: Min assist General bed mobility comments: Required increased time, verbal cues for sequencing, and minA for trunk support. Transfers Overall transfer level: Needs assistance Equipment used:  Rolling walker (2 wheeled), 1 person hand held assist Transfers: Sit to/from Stand Sit to Stand: Min assist General transfer comment: Sit-to-stand x1 reaching out for HHA for balance, requiring minA for balance; stood again with RW and minA. Pt with too much posterior lean in standing to  accurately use RW.  Ambulation/Gait Ambulation/Gait assistance: +2 physical assistance, Max assist Ambulation Distance (Feet): 3 Feet Assistive device: 2 person hand held assist Gait Pattern/deviations: Step-to pattern, Leaning posteriorly, Narrow base of support, Decreased step length - right, Decreased step length - left General Gait Details: Amb forward/backward a few steps to chair with maxA +2 due to significant posterior/left lean which pt is unable to self-correct.  Gait velocity: Decreased  ADL:  Cognition: Cognition Overall Cognitive Status: No family/caregiver present to determine baseline cognitive functioning Orientation Level: Oriented X4 Cognition Arousal/Alertness: Awake/alert Behavior During Therapy: WFL for tasks assessed/performed Overall Cognitive Status: No family/caregiver present to determine baseline cognitive functioning Area of Impairment: Attention, Following commands, Safety/judgement, Awareness, Problem solving Current Attention Level: Selective Following Commands: Follows multi-step commands inconsistently, Follows one step commands inconsistently Safety/Judgement: Decreased awareness of safety Awareness: Emergent Problem Solving: Difficulty sequencing, Requires verbal cues, Requires tactile cues, Slow processing General Comments: Pt demonstrates awareness of visual deficits. Spatial inattention towards right side, only able to track just short of midline. Recognized my gait belt and recalled prior experience with PT with prior CVA.  Blood pressure (!) 151/92, pulse 67, temperature 97.8 F (36.6 C), temperature source Oral, resp. rate 15, height 5\' 10"  (1.778 m), weight 81.5 kg (179 lb 10.8 oz), SpO2 100 %. Physical Exam  Nursing note and vitals reviewed. Constitutional: He is oriented to person, place, and time. He appears well-developed and well-nourished.  Elderly male with left gaze preference and slumped to the left.   HENT:  Head: Normocephalic  and atraumatic.  Mouth/Throat: Oropharynx is clear and moist.  Eyes: Conjunctivae are normal. Pupils are equal, round, and reactive to light.  Eyes fixed to left--able to move left eye to midline with cues  Neck: Normal range of motion. Neck supple.  Cardiovascular: Normal rate and regular rhythm.   Murmur heard. Respiratory: Effort normal and breath sounds normal.  GI: Soft. Bowel sounds are normal. He exhibits no distension. There is no tenderness.  Musculoskeletal: He exhibits no edema or tenderness.  Neurological: He is alert and oriented to person, place, and time.  Soft speech.  Left facial weakness with gaze fixed to the left.  Sensation diminished to light touch right hemiface Motor: RUE/RLE: 5/5 proximal to distal LUE/LLE: 4+/5 proximal to distal DTRs symmetric Sensation intact to light touch No ataxia  Skin: Skin is warm and dry. No erythema.  Psychiatric: His affect is blunt. He is slowed.        Assessment/Plan: Diagnosis: right pons hemorrhage Labs and images independently reviewed.  Records reviewed and summated above. Stroke: Continue secondary stroke prophylaxis and Risk Factor Modification listed below:   Antiplatelet therapy:   Blood Pressure Management:  Continue current medication with prn's with permisive HTN per primary team Left sided hemiparesis  1. Does the need for close, 24 hr/day medical supervision in concert with the patient's rehab needs make it unreasonable for this patient to be served in a less intensive setting? Yes 2. Co-Morbidities requiring supervision/potential complications: HTN (monitor and provide prns in accordance with increased physical exertion and pain), PAF (monitor HR with increased physical activity), CVA '97, Parkinson's disease, Knee OA (Biofeedback training with therapies to help reduce reliance on  opiate pain medications, monitor pain control during therapies, and sedation at rest and titrate to maximum efficacy to ensure  participation and gains in therapies), leukocytosis (cont to monitor for signs and symptoms of infection, further workup if indicated), AKI vs. CKD (avoid nephrotoxic meds) 3. Due to safety, disease management, medication administration and patient education, does the patient require 24 hr/day rehab nursing? Yes 4. Does the patient require coordinated care of a physician, rehab nurse, PT (1-2 hrs/day, 5 days/week) and OT (1-2 hrs/day, 5 days/week) to address physical and functional deficits in the context of the above medical diagnosis(es)? Yes Addressing deficits in the following areas: balance, endurance, locomotion, transferring, bathing, dressing, toileting and psychosocial support 5. Can the patient actively participate in an intensive therapy program of at least 3 hrs of therapy per day at least 5 days per week? Yes 6. The potential for patient to make measurable gains while on inpatient rehab is excellent 7. Anticipated functional outcomes upon discharge from inpatient rehab are min assist  with PT, supervision and min assist with OT, n/a with SLP. 8. Estimated rehab length of stay to reach the above functional goals is: 18-21 days. 9. Does the patient have adequate social supports and living environment to accommodate these discharge functional goals? No 10. Anticipated D/C setting: Home 11. Anticipated post D/C treatments: HH therapy and Home excercise program 12. Overall Rehab/Functional Prognosis: good  RECOMMENDATIONS: This patient's condition is appropriate for continued rehabilitative care in the following setting: CIR if caregiver support available at discharge after completion of medical workup. Patient has agreed to participate in recommended program. Yes Note that insurance prior authorization may be required for reimbursement for recommended care.  Comment: Rehab Admissions Coordinator to follow up.  Maryla Morrow, MD, [place] Dom Jacquelynn Cree, [place] 08/09/2016    Revision  History                        Routing History

## 2016-08-13 NOTE — Evaluation (Signed)
Physical Therapy Assessment and Plan  Patient Details  Name: [name] MRN: [id number] Date of Birth: 25-Mar-1931  PT Diagnosis: Abnormal posture, Abnormality of gait, Coordination disorder, Hemiparesis, Impaired cognition and Muscle weakness Rehab Potential: Fair ELOS: 16-18 days   Today's Date: 08/13/2016 PT Individual Time: 1400-1500 PT Individual Time Calculation (min): 60 min    Problem List:  Patient Active Problem List   Diagnosis Date Noted  . 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 Hyperlipidemia 08/12/2016  . Hypertensive emergency   . Benign essential HTN   . PAF (paroxysmal atrial fibrillation) ([place])   . History of CVA (cerebrovascular accident)   . Parkinson disease ([place])   . Primary osteoarthritis of right knee   . Leukocytosis   . AKI (acute kidney injury) [facility])     Past Medical History:  Past Medical History:  Diagnosis Date  . Benign prostatic hyperplasia (BPH) with urinary urgency   . DDD (degenerative disc disease), lumbar   . Hypertension   . Knee pain   . PAF (paroxysmal atrial fibrillation) ([place])   . Parkinson's disease ([place])   . Rhinitis   . Stroke [facility])    Past Surgical History:  Past Surgical History:  Procedure Laterality Date  . HERNIA REPAIR      Assessment & Plan Clinical Impression: An 83 y.o. male with history of HTN, PAF, CVA '97, Parkinson's disease, Knee OA,  who was admitted on 08/08/16 with left  sided weakness and left gaze preference. CT head reviewed, showing small right brainstem bleed. Per report, 11 X 10 X 18 mm hemorrhage in right pons and chronic ischemic injury with white matter gliosis and remote right lenticulostriate and right cerebellar strokes.  Stroke felt to be hypertensive in origin and follow up CCT stable. Workup on going per Neurology. PT evaluation done revealing significant posterior lean, right inattention with imbalance and cognitive deficits with deficits in problem solving. CIR  recommended for follow up therapy.  Patient transferred to CIR on 08/12/2016 .   Patient currently requires max with mobility secondary to muscle weakness, decreased cardiorespiratoy endurance, impaired timing and sequencing, unbalanced muscle activation, decreased coordination and decreased motor planning, decreased midline orientation and decreased attention to left, decreased attention, decreased awareness, decreased problem solving, decreased safety awareness, decreased memory and delayed processing and decreased sitting balance, decreased standing balance, decreased postural control and decreased balance strategies.  Prior to hospitalization, patient was independent  with mobility and lived with Spouse in a House home.  Home access is 2Stairs to enter.  Patient will benefit from skilled PT intervention to maximize safe functional mobility, minimize fall risk and decrease caregiver burden for planned discharge home with 24 hour assist.  Anticipate patient will benefit from follow up [facility] at discharge.  PT - End of Session Activity Tolerance: Tolerates 10 - 20 min activity with multiple rests Endurance Deficit: Yes Endurance Deficit Description: pt significantly fatigued following ambulation and stair negotiation PT Assessment Rehab Potential (ACUTE/IP ONLY): Fair Barriers to Discharge: Decreased caregiver support PT Patient demonstrates impairments in the following area(s): Balance;Safety;Endurance;Motor;Pain;Perception PT Transfers Functional Problem(s): Bed Mobility;Bed to Chair;Car;Furniture PT Locomotion Functional Problem(s): Stairs;Wheelchair Mobility;Ambulation PT Plan PT Intensity: Minimum of 1-2 x/day ,45 to 90 minutes PT Frequency: 5 out of 7 days PT Duration Estimated Length of Stay: 16-18 days PT Treatment/Interventions: Ambulation/gait training;Community reintegration;DME/adaptive equipment instruction;Neuromuscular re-education;Psychosocial support;Stair training;UE/LE Strength  taining/ROM;Wheelchair propulsion/positioning;UE/LE Coordination activities;Therapeutic Activities;Balance/vestibular training;Discharge planning;Functional electrical stimulation;Cognitive remediation/compensation;Functional mobility training;Patient/family education;Splinting/orthotics;Therapeutic Exercise;Visual/perceptual remediation/compensation PT Transfers  Anticipated Outcome(s): supervision PT Locomotion Anticipated Outcome(s): min assist ambulatory  PT Recommendation Recommendations for Other Services: Therapeutic Recreation consult Therapeutic Recreation Interventions: Pet therapy;Stress management Follow Up Recommendations: Home health PT;24 hour supervision/assistance Patient destination: Home Equipment Recommended: To be determined  Skilled Therapeutic Intervention No c/o pain but pt visibly fatigued to start session.   Session focus on PT explanation of goals, plan of care, ELOS, and safety plan, functional transfers, gait, stair negotiation, and activity tolerance.  Pt performs stand/pivot transfers throughout session with mod assist and mod multimodal cues for sequencing.  Gait training [phone] with RW and mod assist with occasional max due to L lean, which pt is able to correct 75% with standing rest break and verbal cues.  PT instructed pt in stair negotiation x4 (6") + 8 (3") steps, initially mod assist to ascend 6" steps, but once BLEs on landing pt with strong posterior lean/forward trunk flexion and unable to bring self upright, requiring +2 assist to bring hips/chest forward and come upright on landing.  Pt requires initial mod assist to descend 3" steps progressing to total assist with fatigue.  Pt returned to room at end of session left upright in w/c with QRB in place to await next PT session.   PT Evaluation Precautions/Restrictions Precautions Precautions: Fall Precaution Comments: L/posterior lean worse with fatigue Restrictions Weight Bearing Restrictions:  No Pain Pain Assessment Pain Assessment: No/denies pain Home Living/Prior Functioning Home Living Available Help at Discharge: Family;Available 24 hours/day (between wife and 2 sons) Type of Home: House Home Access: Stairs to enter [employer] of Steps: 2 Entrance Stairs-Rails: None Home Layout: Able to live on main level with bedroom/bathroom;Two level ("music room" downstairs) Alternate Level Stairs-Rails: Can reach both Bathroom Shower/Tub: Tub/shower unit;Curtain Bathroom Toilet: Handicapped height Additional Comments: Pt lives with wife, and one son.  the other son lives in an RV behind their house   Lives With: Spouse Prior Function Level of Independence: Independent with transfers;Independent with gait  Able to Take Stairs?: Yes Driving: No Vocation: Retired Comments: Pt reports he recently stopped driving due to decreaesed reflexes and not feeling safe  Vision/Perception  Vision - Assessment Eye Alignment: Impaired (comment) (left eye deviated to the left) Ocular Range of Motion: Restricted looking up (could not scan superiorly in the right quadrant) Alignment/Gaze Preference: Head turned;Gaze left Tracking/Visual Pursuits: Right eye does not track laterally;Left eye does not track laterally  Cognition Overall Cognitive Status: Impaired/Different from baseline Arousal/Alertness: Awake/alert Orientation Level: Oriented X4 Attention: Sustained Focused Attention: Appears intact Sustained Attention: Impaired Sustained Attention Impairment: Functional basic Memory: Impaired Memory Impairment: Decreased recall of new information;Storage deficit Awareness: Impaired Awareness Impairment: Emergent impairment;Anticipatory impairment Problem Solving: Impaired Sequencing: Impaired Sequencing Impairment: Functional basic Safety/Judgment: Impaired Sensation Sensation Light Touch: Appears Intact Stereognosis: Not tested Hot/Cold: Not tested Proprioception: Not  tested Coordination Gross Motor Movements are Fluid and Coordinated: No Fine Motor Movements are Fluid and Coordinated: No Coordination and Movement Description: Pt with slower finger to nose on the dominant LUE compared to the non-dominant RUE. Motor  Motor Motor: Hemiplegia;Abnormal postural alignment and control;Motor impersistence Motor - Skilled Clinical Observations: Slight dysmetria in the LUE during functional use.  Mobility Transfers Transfers: Yes Sit to Stand: 3: Mod assist Sit to Stand Details: Manual facilitation for weight shifting;Verbal cues for sequencing Stand to Sit: 3: Mod assist Stand to Sit Details (indicate cue type and reason): Manual facilitation for weight shifting;Verbal cues for sequencing Stand Pivot Transfers: 3: Mod assist Locomotion  Ambulation Ambulation:  Yes Ambulation/Gait Assistance: 3: Mod assist Ambulation Distance (Feet): 115 Feet Assistive device: Rolling walker Ambulation/Gait Assistance Details: Tactile cues for weight shifting;Tactile cues for posture;Verbal cues for technique;Verbal cues for precautions/safety;Verbal cues for safe use of DME/AE;Verbal cues for gait pattern;Manual facilitation for weight shifting Ambulation/Gait Assistance Details: increasing L lateral lean with fatigue Gait Gait: Yes Gait Pattern: Impaired Gait Pattern: Step-to pattern;Decreased step length - left;Decreased weight shift to right;Left steppage;Narrow base of support Stairs / Additional Locomotion Stairs: Yes Stairs Assistance: 1: +2 Total assist;3: Mod assist (range from mod to total +2 due to motor planning deficits) Stairs Assistance Details: Verbal cues for gait pattern;Verbal cues for safe use of DME/AE;Manual facilitation for weight shifting;Manual facilitation for placement;Verbal cues for precautions/safety;Verbal cues for technique;Visual cues/gestures for sequencing;Verbal cues for sequencing;Visual cues for safe use of DME/AE;Visual cues/gestures  for precautions/safety;Tactile cues for posture;Tactile cues for sequencing;Tactile cues for initiation;Tactile cues for weight shifting;Tactile cues for placement Stairs Assistance Details (indicate cue type and reason): pt able to ascend 4 steps with 2 rails with mod assist, then required +2 for cuing to come upright at top of steps and turn and total assist to descend 3" steps.   Number of Stairs: 8 Wheelchair Mobility Wheelchair Mobility: Yes Wheelchair Assistance: 4: Energy manager: Both lower extermities;Both upper extremities Distance: 50  Trunk/Postural Assessment  Cervical Assessment Cervical Assessment: Exceptions to [facility] (protraction and L rotation) Thoracic Assessment Thoracic Assessment: Exceptions to [facility] (kyphotic) Lumbar Assessment Lumbar Assessment: Exceptions to [facility] (posterior pelvic tilt, somewhat fixed) Postural Control Postural Control: Deficits on evaluation Righting Reactions: insufficient and delayed Protective Responses: delayed Postural Limitations: decreased  Balance Balance Balance Assessed: Yes Static Sitting Balance Static Sitting - Balance Support: Feet supported Static Sitting - Level of Assistance: 3: Mod assist;Other (comment) (initially min guard assist but needed mod assist by end of ADL) Dynamic Sitting Balance Dynamic Sitting - Balance Support: During functional activity;Feet supported Dynamic Sitting - Level of Assistance: 2: Max assist Static Standing Balance Static Standing - Balance Support: During functional activity;Bilateral upper extremity supported Static Standing - Level of Assistance: 3: Mod assist Dynamic Standing Balance Dynamic Standing - Balance Support: During functional activity;Bilateral upper extremity supported Dynamic Standing - Level of Assistance: 2: Max assist Extremity Assessment  RUE Assessment RUE Assessment: Within Functional Limits (Not formally assessed but shoulder AROM at least 130 degrees noted  during ADL.  He was able to use the RUE spontaneously for washing the RUE and for applying deodorant. ) LUE Assessment LUE Assessment: Exceptions to [facility] LUE Strength LUE Overall Strength Comments: Pt with slight dysmetria noted with gross testing.  Functionally AROM shoulder flexion approximately 110 with tight end range AAROM.  All other joints AROM WFLS with grip strength 3+/5.   RLE Assessment RLE Assessment: Exceptions to [facility] RLE Strength RLE Overall Strength Comments: unable to follow commands for formal testing but able to move against gravity LLE Assessment LLE Assessment: Exceptions to [facility] LLE Strength LLE Overall Strength Comments: unable to follow commands for formal testing but able to move against gravity   See Function Navigator for Current Functional Status.   Refer to Care Plan for Long Term Goals  Recommendations for other services: Therapeutic Recreation  Pet therapy and Stress management  Discharge Criteria: Patient will be discharged from PT if patient refuses treatment 3 consecutive times without medical reason, if treatment goals not met, if there is a change in medical status, if patient makes no progress towards goals or if patient is discharged  from hospital.  The above assessment, treatment plan, treatment alternatives and goals were discussed and mutually agreed upon: by patient  Urban Gibson E Penven-Crew 08/13/2016, 4:40 PM

## 2016-08-13 NOTE — Evaluation (Signed)
Speech Language Pathology Assessment and Plan  Patient Details  Name: [name] MRN: [id number] Date of Birth: April 02, 1931  SLP Diagnosis: Cognitive Impairments;Dysarthria  Rehab Potential: Good ELOS: 16-18 days     Today's Date: 08/13/2016 SLP Individual Time: 5732-2025 SLP Individual Time Calculation (min): 55 min   Problem List:  Patient Active Problem List   Diagnosis Date Noted  . 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 Hyperlipidemia 08/12/2016  . Hypertensive emergency   . Benign essential HTN   . PAF (paroxysmal atrial fibrillation) ([place])   . History of CVA (cerebrovascular accident)   . Parkinson disease ([place])   . Primary osteoarthritis of right knee   . Leukocytosis   . AKI (acute kidney injury) [facility])    Past Medical History:  Past Medical History:  Diagnosis Date  . Benign prostatic hyperplasia (BPH) with urinary urgency   . DDD (degenerative disc disease), lumbar   . Hypertension   . Knee pain   . PAF (paroxysmal atrial fibrillation) ([place])   . Parkinson's disease ([place])   . Rhinitis   . Stroke [facility])    Past Surgical History:  Past Surgical History:  Procedure Laterality Date  . HERNIA REPAIR      Assessment / Plan / Recommendation Clinical Impression [name] a 83 y.o.LH-malewith history of HTN, PAF, CVA '97, Parkinson's disease, Knee OA, who was admitted on 08/08/16 with left sided weakness and left gaze preference. CT head reviewed, showing small right brainstem bleed. Per report, 11 X 10 X 18 mm hemorrhage in right pons and chronic ischemic injury with white matter gliosis and remote right lenticulostriate and right cerebellar strokes. Stroke felt to be hypertensive in origin and follow up CCT stable. He has had issues with confusion and hallucinations.  Speech therapy evaluation revealed decreased initiation, delayed processing, poor safety with severe cognitive impairments, lability and dysarthria.  Therapy  initiated and patient with significant posterior lean, right inattention with imbalance and inability to carry out ADL tasks. CIR recommended for follow up therapy.   Cognitive-linguistic and bedside swallow evaluations complete.  Patient asleep and required Max assist multimodal cues for arousal.  Environmental, verbal, tactile/face washing, and oral care eventually effective at waking patient.  He required Max assist multimodal cues to problem solve breakfast tray set-up and locate all items on tray.  Patient also demonstrated poor sustained attention to self-feeding resulting in prolonged mastication and oral residue that required Max assist verbal cues for awareness of and clearance with thin liquids sips.  Patient demonstrates impaired basic cognition at the level of sustained attention, which impacts all higher level abilities.  This impact's the patient's overall safety with functional self-care tasks. Patient also demonstrated questionable perseveration with oral care and face washing.  Patient would benefit from skilled SLP intervention in order to maximize his functional independence prior to discharge. Anticipate patient will require 24 hour supervision at home and follow up SLP services.    Skilled Therapeutic Interventions          Skilled treatment initiated with focus on addressing dysphagia goals. SLP facilitated session by providing Max assist verbal instructional cues to attend to oral residue and manage with alternating solids and liquids.  Continue with current plan of care.    SLP Assessment  Patient will need skilled Speech Lanaguage Pathology Services during CIR admission    Recommendations  SLP Diet Recommendations: Dysphagia 3 (Mech soft);Thin Liquid Administration via: Cup;Straw Medication Administration: Whole meds with liquid Supervision: Patient  able to self feed;Full supervision/cueing for compensatory strategies Compensations: Minimize environmental distractions;Slow  rate;Small sips/bites;Lingual sweep for clearance of pocketing;Multiple dry swallows after each bite/sip;Follow solids with liquid Postural Changes and/or Swallow Maneuvers: Seated upright 90 degrees Oral Care Recommendations: Oral care BID;Oral care before and after PO Patient destination: Home Follow up Recommendations: 24 hour supervision/assistance;Home Health SLP;Outpatient SLP Equipment Recommended: None recommended by SLP    SLP Frequency 1 to 3 out of 7 days   SLP Duration  SLP Intensity  SLP Treatment/Interventions 16-18 days   Minumum of 1-2 x/day, 30 to 90 minutes  Cognitive remediation/compensation;Cueing hierarchy;Environmental controls;Functional tasks;Internal/external aids;Patient/family education;Other (comment);Speech/Language facilitation (RMT)    Pain Pain Assessment Pain Assessment: No/denies pain  Prior Functioning Cognitive/Linguistic Baseline: Baseline deficits Baseline deficit details: per chart review, wife reports low vocal intensity, states patient has not managed bills or writing tasks since his previous stroke Type of Home: House  Lives With: Spouse Available Help at Discharge: Family;Available 24 hours/day (between wife and 2 sons) Education: 8th grade Vocation: Retired  Function:  Eating Eating   Modified Consistency Diet: Yes Eating Assist Level: Set up assist for;Helper checks for pocketed food;Supervision or verbal cues;Helper scoops food on utensil;Help managing cup/glass   Eating Set Up Assist For: Opening containers;Cutting food;Applying device (includes dentures) Helper Scoops Food on Utensil: Occasionally     Cognition Comprehension Comprehension assist level: Understands basic 50 - 74% of the time/ requires cueing 25 - 49% of the time  Expression   Expression assist level: Expresses basic 50 - 74% of the time/requires cueing 25 - 49% of the time. Needs to repeat parts of sentences.  Social Interaction Social Interaction assist  level: Interacts appropriately 25 - 49% of time - Needs frequent redirection.  Problem Solving Problem solving assist level: Solves basic 25 - 49% of the time - needs direction more than half the time to initiate, plan or complete simple activities  Memory Memory assist level: Recognizes or recalls 25 - 49% of the time/requires cueing 50 - 75% of the time   Short Term Goals: Week 1: SLP Short Term Goal 1 (Week 1): Patient will solve basic problems realted to self-care with Mod assist  SLP Short Term Goal 2 (Week 1): Patient will verbally identify 2 physical and 2 cognitive changes wtih Mod question cues  SLP Short Term Goal 3 (Week 1): Patient will consume Dys.3 textures and thin liquids with Mod assist verbal question cues to recall and utilize safe swallow strategeis to manage oral residue and prevent overt s/s of aspiration.   SLP Short Term Goal 4 (Week 1): Patient will sustain attention to familiar tasks for 10-20 minutes with Mod assist verbal cues for redirection SLP Short Term Goal 5 (Week 1): Patient will utilize speech intelligibilty strategies at the phrase-sentence level of verbal expression with Mod assist verbal cues   Refer to Care Plan for Long Term Goals  Recommendations for other services: None   Discharge Criteria: Patient will be discharged from SLP if patient refuses treatment 3 consecutive times without medical reason, if treatment goals not met, if there is a change in medical status, if patient makes no progress towards goals or if patient is discharged from hospital.  The above assessment, treatment plan, treatment alternatives and goals were discussed and mutually agreed upon: No family available/patient unable  Carmelia Roller., [place]  Rogers 08/13/2016, 7:40 PM

## 2016-08-13 NOTE — Care Management Note (Signed)
Inpatient Rehabilitation Center Individual Statement of Services  Patient Name:  Para Skeanslbert Mance  Date:  08/13/2016  Welcome to the Inpatient Rehabilitation Center.  Our goal is to provide you with an individualized program based on your diagnosis and situation, designed to meet your specific needs.  With this comprehensive rehabilitation program, you will be expected to participate in at least 3 hours of rehabilitation therapies Monday-Friday, with modified therapy programming on the weekends.  Your rehabilitation program will include the following services:  Physical Therapy (PT), Occupational Therapy (OT), Speech Therapy (ST), 24 hour per day rehabilitation nursing, Therapeutic Recreaction (TR), Case Management (Social Worker), Rehabilitation Medicine, Nutrition Services and Pharmacy Services  Weekly team conferences will be held on Wednesday to discuss your progress.  Your Social Worker will talk with you frequently to get your input and to update you on team discussions.  Team conferences with you and your family in attendance may also be held.  Expected length of stay: 16-18 days Overall anticipated outcome: min-some supervision level  Depending on your progress and recovery, your program may change. Your Social Worker will coordinate services and will keep you informed of any changes. Your Social Worker's name and contact numbers are listed  below.  The following services may also be recommended but are not provided by the Inpatient Rehabilitation Center:    Home Health Rehabiltiation Services  Outpatient Rehabilitation Services   Arrangements will be made to provide these services after discharge if needed.  Arrangements include referral to agencies that provide these services.  Your insurance has been verified to be:  [place] [employer] [place] Your primary doctor is:  Feliciana RossettiGreg Grisso  Pertinent information will be shared with your doctor and your insurance company.  Social Worker:   Dossie DerBecky Lulia Schriner, SW [phone] or ([phone]) [phone]  Information discussed with and copy given to patient by: Lucy Chrisupree, Serayah Yazdani G, 08/13/2016, 11:17 AM

## 2016-08-13 NOTE — Progress Notes (Signed)
Social Work Assessment and Plan Social Work Assessment and Plan  Patient Details  Name: [name] MRN: [id number] Date of Birth: [birth year]  Today's Date: 08/13/2016  Problem List:  Patient Active Problem List   Diagnosis Date Noted  . 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 Hyperlipidemia 08/12/2016  . Hypertensive emergency   . Benign essential HTN   . PAF (paroxysmal atrial fibrillation) (HCC)   . History of CVA (cerebrovascular accident)   . Parkinson disease (HCC)   . Primary osteoarthritis of right knee   . Leukocytosis   . AKI (acute kidney injury) [facility](HCC)    Past Medical History:  Past Medical History:  Diagnosis Date  . Benign prostatic hyperplasia (BPH) with urinary urgency   . DDD (degenerative disc disease), lumbar   . Hypertension   . Knee pain   . PAF (paroxysmal atrial fibrillation) (HCC)   . Parkinson's disease (HCC)   . Rhinitis   . Stroke [facility](HCC)    Past Surgical History:  Past Surgical History:  Procedure Laterality Date  . HERNIA REPAIR     Social History:  reports that he has never smoked. He has quit using smokeless tobacco. His alcohol and drug histories are not on file.  Family / Support Systems Marital Status: Married Patient Roles: Spouse, Parent Spouse/Significant Other: [name] [phone]  [phone] Children: Donald-son and another son whom lives with them Other Supports: friends and church members Anticipated Caregiver: Wife and son Ability/Limitations of Caregiver: Wife can only provide supervision level due to her own health issues, son-Donald is on a leave at this time to assist Mom with coming back and forth here and Dad's care Caregiver Availability: 24/7 Family Dynamics: Close knit small family both son's are either with them at home or on their property. Wife reports she was able to take care of him before after the NH and feels this time is not as bad of a stroke. Both are older now it was 20 years  ago.  Social History Preferred language: English Religion: Baptist Cultural Background: No issues Education: [employer] Read: Yes Write: Yes Employment Status: Retired Fish farm managerLegal Hisotry/Current Legal Issues: No issues Guardian/Conservator: none-according to MD pt is not capable of making his own decisions while here, will look at wife to make any decisions while here.   Abuse/Neglect Physical Abuse: Denies Verbal Abuse: Denies Sexual Abuse: Denies Exploitation of patient/patient's resources: Denies Self-Neglect: Denies  Emotional Status Pt's affect, behavior adn adjustment status: Pt is motivated to do well while here, and reports the OT must be tired after his am session. Pt has was able to take care of his needs prior to admission and wife hopes he can get back to this. He will do his best while here. Pt did go to a NH after his last stroke due to too much care for his wife, will see how mcuh progress he makes this time. Recent Psychosocial Issues: other health issues, not much residual deficits from last stroke. He does have some left hand issues from last stroke. Pyschiatric History: No history allows wife to answer questions and will when asked. He is quiet and soft spoken. Will continue to evaluate and see if would benefit from neuro-psych seeing while here. Will allow to adjust to the rehab unit. Substance Abuse History: No issues  Patient / Family Perceptions, Expectations & Goals Pt/Family understanding of illness & functional limitations: Pt and wife can explain his stroke and deficits. They do talk with the MD  and feel their questions are being answered. They have been here before and feel they are familiar with the process here.  Wife plans to be here daily and observe him in therapies. Premorbid pt/family roles/activities: Husband, father, retiree, church member, friend, Chief Financial Officer, etc Anticipated changes in roles/activities/participation: resume Pt/family expectations/goals:  Pt states: " I want to make progress here."  Wife states: " I will help him and feel it won't take long since the stroke was not that bad this time."  [employer]: Other (Comment) (had after last CVA-[facility] in [place]) Premorbid Home Care/DME Agencies: Other (Comment) (Had HH and OP after last stroke) Transportation available at discharge: Wife drives in [place] but not [place], otherwise son is driving Mom to hospital to see Dad Resource referrals recommended: Support group (specify)  Discharge Planning Living Arrangements: Spouse/significant other, Children Support Systems: Spouse/significant other, Children, Friends/neighbors, Psychologist, clinical community Type of Residence: Private residence Insurance Resources: [employer], Media planner (specify) ([employer]) Surveyor, quantity Resources: Restaurant manager, fast food Screen Referred: No Living Expenses: Own Money Management: Spouse Does the patient have any problems obtaining your medications?: No Home Management: Wife does the home management Patient/Family Preliminary Plans: Return home with wife and son who has taken a leave from work for the next month, then go from there. Made both aware it may take longer than one month for son to be off, will play it by ear. All aware of options in case pt doesn't progress to the level where they can manage him at home. Social Work Anticipated Follow Up Needs: HH/OP, Support Group  Clinical Impression Pleasant gentleman who has been on rehab 20 years ago after first stroke. He went to a NH then went home, this time his son has taken a leave from work to assist Mom with Dad's care. Will await team's evaluation and work  On a realistic discharge plan. Wife plans to be here daily and support pt in his therapies. Will have neuro-psych see if team feels would benefit while here.  Lucy Chris 08/13/2016, 12:49 PM

## 2016-08-13 NOTE — Progress Notes (Signed)
Subjective/Complaints: Patient is somnolent, arouses briefly to voice.  Review of systems could not obtain due to mental status  Objective: Vital Signs: Blood pressure (!) 186/93, pulse 95, temperature 98.2 F (36.8 C), temperature source Oral, resp. rate 18, height 5\' 9"  (1.753 m), weight 82 kg (180 lb 12.4 oz), SpO2 95 %. No results found. Results for orders placed or performed during the hospital encounter of 08/12/16 (from the past 72 hour(s))  CBC WITH DIFFERENTIAL     Status: Abnormal   Collection Time: 08/13/16  5:33 AM  Result Value Ref Range   WBC 19.9 (H) 4.0 - 10.5 K/uL   RBC 4.72 4.22 - 5.81 MIL/uL   Hemoglobin 15.2 13.0 - 17.0 g/dL   HCT 60.4 54.0 - 98.1 %   MCV 92.6 78.0 - 100.0 fL   MCH 32.2 26.0 - 34.0 pg   MCHC 34.8 30.0 - 36.0 g/dL   RDW 19.1 47.8 - 29.5 %   Platelets 180 150 - 400 K/uL   Neutrophils Relative % 85 %   Neutro Abs 16.7 (H) 1.7 - 7.7 K/uL   Lymphocytes Relative 5 %   Lymphs Abs 1.1 0.7 - 4.0 K/uL   Monocytes Relative 10 %   Monocytes Absolute 2.1 (H) 0.1 - 1.0 K/uL   Eosinophils Relative 0 %   Eosinophils Absolute 0.1 0.0 - 0.7 K/uL   Basophils Relative 0 %   Basophils Absolute 0.0 0.0 - 0.1 K/uL     HEENT: Poor dentition, upper earlobe with dermatitis Cardio: RRR and No murmur Resp: CTA B/L and Unlabored GI: BS positive and Nontender, nondistended Extremity:  No Edema Skin:   Other Dry with poor toenail care Neuro: Lethargic, Abnormal Sensory Withdraws to pinch. Bilateral upper and lower limbs, further assessment not possible due to mental status and Abnormal Motor Patient does move antigravity. With brief effort all 4 limbs, hampered by mental status Musc/Skel:  Other No joint swelling noted in the upper or lower extremities. No pain with passive range of motion Gen. no acute distress   Assessment/Plan: 1. 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 which require 3+ hours per day of interdisciplinary therapy in a  comprehensive inpatient rehab setting. Physiatrist is providing close team supervision and 24 hour management of active medical problems listed below. Physiatrist and rehab team continue to assess barriers to discharge/monitor patient progress toward functional and medical goals. FIM:                                  Medical Problem List and Plan: 1.  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  CIR today 2.  DVT Prophylaxis/Anticoagulation: Pharmaceutical: Lovenox 3. Pain Management: Tylenol prn for knee pain 4. Mood: LCSW to follow for evaluation and support.  5. Neuropsych: This patient is not fully capable of making decisions on his own behalf. 6. Skin/Wound Care: routine pressure relief  7. Fluids/Electrolytes/Nutrition: Monitor I/O. Check lytes today 8. PAF: Monitor HR bid.  Vitals:   08/12/16 1608 08/12/16 1802  BP: (!) 183/87 (!) 186/93  Pulse: 95   Resp: 18   Temp: 98.2 F (36.8 C)    9. HTN: Monitor BP. Cont meds, may need to increase B blocker, has prn catapres 10. Knee OA: Meds as necessary 11. Parkinson's disease: Cont to monitor 12. Leukocytosis With reduced level of alertness: Follow CBC, afebrile check UA, CXR 13. AKI vs.  CKD: Follow BMP  LOS (Days) 1 A FACE TO FACE EVALUATION WAS PERFORMED  KIRSTEINS,ANDREW E 08/13/2016, 6:30 AM

## 2016-08-13 NOTE — Evaluation (Signed)
Occupational Therapy Assessment and Plan  Patient Details  Name: [name] MRN: [id number] Date of Birth: March 07, 1931  OT Diagnosis: abnormal posture, disturbance of vision, hemiplegia affecting non-dominant side and muscle weakness (generalized) Rehab Potential: Rehab Potential (ACUTE ONLY): Excellent ELOS: 16-18 days   Today's Date: 08/13/2016 OT Individual Time: 0865-7846 OT Individual Time Calculation (min): 62 min     Problem List:  Patient Active Problem List   Diagnosis Date Noted  . Pontine hemorrhage ([name]) - small R paramedian d/t HTN 08/12/2016  . Hyperlipidemia 08/12/2016  . Hypertensive emergency   . Benign essential HTN   . PAF (paroxysmal atrial fibrillation) ([name])   . History of CVA (cerebrovascular accident)   . Parkinson disease ([name])   . Primary osteoarthritis of right knee   . Leukocytosis   . AKI (acute kidney injury) [facility])     Past Medical History:  Past Medical History:  Diagnosis Date  . Benign prostatic hyperplasia (BPH) with urinary urgency   . DDD (degenerative disc disease), lumbar   . Hypertension   . Knee pain   . PAF (paroxysmal atrial fibrillation) ([name])   . Parkinson's disease ([place])   . Rhinitis   . Stroke [facility])    Past Surgical History:  Past Surgical History:  Procedure Laterality Date  . HERNIA REPAIR      Assessment & Plan Clinical Impression: Patient is a 82 y.o. year old male with recent admission to the hospital on 08/08/16 with left sided weakness and left gaze preference. CT head reviewed, showing small right brainstem bleed. Per report, 11 X 10 X 18 mm hemorrhage in right pons and chronic ischemic injury with white matter gliosis and remote right lenticulostriate and right cerebellar strokes.   Patient transferred to CIR on 08/12/2016 .    Patient currently requires total with basic self-care skills secondary to muscle weakness, impaired timing and sequencing, unbalanced muscle activation and decreased coordination, decreased  visual acuity, decreased midline orientation, decreased awareness and decreased problem solving and decreased sitting balance, decreased standing balance, decreased postural control, hemiplegia and decreased balance strategies.  Prior to hospitalization, patient could complete ADLs with independent .  Patient will benefit from skilled intervention to decrease level of assist with basic self-care skills and increase independence with basic self-care skills prior to discharge home with care partner.  Anticipate patient will require minimal physical assistance and follow up home health.  OT - End of Session Activity Tolerance: Tolerates 30+ min activity with multiple rests Endurance Deficit: Yes Endurance Deficit Description: Pt with eyes closed and less verbally responsive at end of session secondary to fatigue.   OT Assessment Rehab Potential (ACUTE ONLY): Excellent Barriers to Discharge: Decreased caregiver support Barriers to Discharge Comments: Wife can only provide min assist OT Patient demonstrates impairments in the following area(s): Balance;Cognition;Endurance;Motor;Perception;Vision;Safety OT Basic ADL's Functional Problem(s): Grooming;Bathing;Dressing;Toileting;Eating OT Transfers Functional Problem(s): Toilet;Tub/Shower OT Additional Impairment(s): Fuctional Use of Upper Extremity OT Plan OT Intensity: Minimum of 1-2 x/day, 45 to 90 minutes OT Frequency: 5 out of 7 days OT Duration/Estimated Length of Stay: 16-18 days OT Treatment/Interventions: Balance/vestibular training;Cognitive remediation/compensation;Community reintegration;Discharge planning;Disease mangement/prevention;DME/adaptive equipment instruction;Functional mobility training;Neuromuscular re-education;Pain management;Patient/family education;Psychosocial support;Self Care/advanced ADL retraining;Therapeutic Activities;Therapeutic Exercise;UE/LE Strength taining/ROM;UE/LE Coordination activities;Visual/perceptual  remediation/compensation;Wheelchair propulsion/positioning OT Self Feeding Anticipated Outcome(s): modified independent OT Basic Self-Care Anticipated Outcome(s): supervision to min assist. OT Toileting Anticipated Outcome(s): supervision  OT Bathroom Transfers Anticipated Outcome(s): supervision OT Recommendation Patient destination: Home Follow Up Recommendations: Home health OT;24 hour supervision/assistance Equipment Recommended: Tub/shower bench  Skilled Therapeutic Intervention Pt transferred from supine to sit EOB with min assist level.  He was able to maintain static sitting EOB with min guard assist initially but regressed to mod assist secondary to fatigue.  Noted increased posterior lean and posterior pelvic tilt with UB bathing tasks.  He needed increased time to locate soap on the left side of midline as well.  Max assist for sit to stand intervals secondary to posterior lean.  Total +2 (pt 30%) for peri care and for pulling brief and pants over hips.  Pt with increased fatigue as session progressed with dynamic sitting balance also regressing to max assist level with increased pushing/falling to the left without awareness to self correct.  Pt transferred to bedside recliner with max assist stand pivot to conclude session.  Safety belt in place and spouse also present.  Discussed expectations of goals with pt and spouse and they agree with OT plan at this time.    OT Evaluation Precautions/Restrictions  Precautions Precautions: Fall Restrictions Weight Bearing Restrictions: No  Pain Pain Assessment Pain Assessment: No/denies pain Home Living/Prior Functioning Home Living Living Arrangements: Spouse/significant other, Children Available Help at Discharge: Family, Available 24 hours/day (son lives behind them and can help as well.  Second son may be able to assist after work.) Type of Home: House Home Access: Stairs to enter Technical brewer of Steps: 1 Home Layout:  One level Bathroom Shower/Tub: Tub/shower unit, Architectural technologist: Handicapped height Additional Comments: Pt lives with wife, and one son.  the other son lives in an RV behind their house   Lives With: Spouse IADL History Homemaking Responsibilities: No Current License: Yes Mode of TransportationOccupational psychologist Education: 8th grade Occupation: Retired Leisure and Hobbies: Likes watching [place] on TV, played Scientist, research (medical) on computer Prior Function Level of Independence: Independent with basic ADLs, Independent with transfers Driving: No (Drove very little per wife she did most of the driving) Vocation: Retired Comments: Pt reports he recently stopped driving due to decreaesed reflexes and not feeling safe  ADL ADL ADL Comments: See Functional Tool for details Vision/Perception  Vision- History Baseline Vision/History: Wears glasses Wears Glasses: Reading only Patient Visual Report: Blurring of vision Vision- Assessment Vision Assessment?: Vision impaired- to be further tested in functional context Eye Alignment: Impaired (comment) (left eye deviated to the left) Ocular Range of Motion: Restricted looking up (could not scan superiorly in the right quadrant) Alignment/Gaze Preference: Head turned;Gaze left Tracking/Visual Pursuits: Right eye does not track laterally;Left eye does not track laterally Visual Fields: Impaired-to be further tested in functional context  Cognition Overall Cognitive Status: Impaired/Different from baseline Arousal/Alertness: Awake/alert Orientation Level: Place;Person;Situation Person: Oriented Place: Oriented Situation: Oriented Year: 2018 Month: March Day of Week: Incorrect Memory: Impaired Memory Impairment: Decreased recall of new information;Storage deficit Immediate Memory Recall: Sock;Blue;Bed Memory Recall: Sock;Blue Memory Recall Sock: Without Cue Memory Recall Blue: Without Cue Attention: Sustained Focused Attention: Appears  intact Sustained Attention: Impaired Sustained Attention Impairment: Functional basic Awareness: Appears intact Sequencing: Impaired Sequencing Impairment: Functional basic Sensation Sensation Light Touch: Appears Intact Stereognosis: Not tested Hot/Cold: Not tested Proprioception: Not tested Coordination Gross Motor Movements are Fluid and Coordinated: No Fine Motor Movements are Fluid and Coordinated: No Coordination and Movement Description: Pt with slower finger to nose on the dominant LUE compared to the non-dominant RUE. Motor  Motor Motor: Hemiplegia;Abnormal postural alignment and control Motor - Skilled Clinical Observations: Slight dysmetria in the LUE during functional use. Mobility  Transfers Transfers: Sit to Stand;Stand to Sit  Sit to Stand: 2: Max assist;With upper extremity assist;From bed Sit to Stand Details: Manual facilitation for weight shifting;Verbal cues for sequencing Stand to Sit: 2: Max assist;With upper extremity assist;To chair/3-in-1 Stand to Sit Details (indicate cue type and reason): Manual facilitation for weight shifting;Verbal cues for sequencing  Trunk/Postural Assessment  Cervical Assessment Cervical Assessment: Exceptions to [facility] (cervical protraction and slight rotation to the left in sitting. ) Thoracic Assessment Thoracic Assessment: Exceptions to [facility] (increased thoracic kyposis with session) Lumbar Assessment Lumbar Assessment: Exceptions to [facility] (increased lumbar flexion at rest and maintains posterior pelvic tilt) Postural Control Postural Control: Deficits on evaluation Righting Reactions: Increased pushing/lean to the left with LOB.  Balance Balance Balance Assessed: Yes Static Sitting Balance Static Sitting - Balance Support: Feet supported Static Sitting - Level of Assistance: 3: Mod assist;Other (comment) (initially min guard assist but needed mod assist by end of ADL) Dynamic Sitting Balance Dynamic Sitting - Balance Support:  During functional activity;Feet supported Dynamic Sitting - Level of Assistance: 2: Max assist Static Standing Balance Static Standing - Balance Support: During functional activity;Bilateral upper extremity supported Static Standing - Level of Assistance: 2: Max assist Dynamic Standing Balance Dynamic Standing - Balance Support: During functional activity;Bilateral upper extremity supported Dynamic Standing - Level of Assistance: 2: Max assist Extremity/Trunk Assessment RUE Assessment RUE Assessment: Within Functional Limits (Not formally assessed but shoulder AROM at least 130 degrees noted during ADL.  He was able to use the RUE spontaneously for washing the RUE and for applying deodorant. ) LUE Assessment LUE Assessment: Exceptions to [facility] LUE Strength LUE Overall Strength Comments: Pt with slight dysmetria noted with gross testing.  Functionally AROM shoulder flexion approximately 110 with tight end range AAROM.  All other joints AROM WFLS with grip strength 3+/5.     See Function Navigator for Current Functional Status.   Refer to Care Plan for Long Term Goals  Recommendations for other services: None    Discharge Criteria: Patient will be discharged from OT if patient refuses treatment 3 consecutive times without medical reason, if treatment goals not met, if there is a change in medical status, if patient makes no progress towards goals or if patient is discharged from hospital.  The above assessment, treatment plan, treatment alternatives and goals were discussed and mutually agreed upon: by patient and by family  Halina Asano OTR/L 08/13/2016, 3:52 PM

## 2016-08-13 NOTE — Progress Notes (Signed)
Patient information reviewed and entered into eRehab system by Breiana Stratmann, RN, CRRN, PPS Coordinator.  Information including medical coding and functional independence measure will be reviewed and updated through discharge.     Per nursing patient was given "Data Collection Information Summary for Patients in Inpatient Rehabilitation Facilities with attached "Privacy Act Statement-Health Care Records" upon admission.  

## 2016-08-13 NOTE — Progress Notes (Signed)
Physical Therapy Note  Patient Details  Name: [name] MRN: [id number] Date of Birth: 07-18-1930 Today's Date: 08/13/2016    Time: 1515-1530 15 minutes  1:1 No c/o pain. Pt asleep in w/c and needing to get into bed per nursing to be cathed. Pt performed stand pivot transfer with RW with mod A. Pt able to go sit to supine and pull himself up in bed with min A, family present.   Shylee Durrett 08/13/2016, 3:38 PM

## 2016-08-14 ENCOUNTER — Inpatient Hospital Stay (HOSPITAL_COMMUNITY): Payer: Medicare Other | Admitting: *Deleted

## 2016-08-14 ENCOUNTER — Inpatient Hospital Stay (HOSPITAL_COMMUNITY): Payer: Medicare Other | Admitting: Physical Therapy

## 2016-08-14 ENCOUNTER — Inpatient Hospital Stay (HOSPITAL_COMMUNITY): Payer: Medicare Other | Admitting: Occupational Therapy

## 2016-08-14 ENCOUNTER — Inpatient Hospital Stay (HOSPITAL_COMMUNITY): Payer: Medicare Other | Admitting: Speech Pathology

## 2016-08-14 DIAGNOSIS — G8194 Hemiplegia, unspecified affecting left nondominant side: Secondary | ICD-10-CM

## 2016-08-14 DIAGNOSIS — H4921 Sixth [abducent] nerve palsy, right eye: Secondary | ICD-10-CM

## 2016-08-14 LAB — URINE CULTURE

## 2016-08-14 MED ORDER — SODIUM CHLORIDE 0.45 % IV SOLN
INTRAVENOUS | Status: DC
Start: 2016-08-14 — End: 2016-08-17
  Administered 2016-08-14: 75 mL/h via INTRAVENOUS

## 2016-08-14 MED ORDER — CEPHALEXIN 250 MG PO CAPS
250.0000 mg | ORAL_CAPSULE | Freq: Three times a day (TID) | ORAL | Status: DC
  Administered 2016-08-14 – 2016-08-21 (×22): 250 mg via ORAL
  Filled 2016-08-14 (×22): qty 1

## 2016-08-14 NOTE — Progress Notes (Signed)
Recreational Therapy Session Note  Patient Details  Name: [name] MRN: [id number] Date of Birth: 06-29-1930 Today's Date: 08/14/2016  Pain: no c/o Skilled Therapeutic Interventions/Progress Updates: met with pt and wife to discuss TR services. Both state pt is working hard and attempting to complete scheduled therapy sessions, but that he is easily fatigued.  Full eval deferred at their requries due to low activity tolerance.  Will continue to monitor through team.  David Towson 08/14/2016, 4:28 PM

## 2016-08-14 NOTE — Progress Notes (Signed)
Social Work Patient ID: [name], male   DOB: 13-Nov-1930, 83 y.o.   MRN: [id number]  Met with pt and wife to discuss team conference goals supervision-min assist level and target discharge date 3/22. Wife reports pt not feeling well and worker informed her UTI and fluids to begin soon. Pt voiced he is trying in therapies and is pushing himself. Hopefully will begin to feel better once antibiotics have begun. Will continue to work on discharge plans with family both wife and son are here daily.

## 2016-08-14 NOTE — IPOC Note (Signed)
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     Functional Problem List: Nursing Behavior, Bladder, Bowel, Endurance, Medication Management, Nutrition, Motor, Pain, Safety, Skin Integrity  PT Balance, Safety, Endurance, Motor, Pain, Perception  OT Balance, Cognition, Endurance, Motor, Perception, Vision, Safety  SLP Cognition  TR         Basic ADL's: OT Grooming, Bathing, Dressing, Toileting, Eating     Advanced  ADL's: OT       Transfers: PT Bed Mobility, Bed to Chair, Car, Occupational psychologisturniture  OT Toilet, Research scientist (life sciences)Tub/Shower     Locomotion: PT Stairs, Psychologist, prison and probation servicesWheelchair Mobility, Ambulation     Additional Impairments: OT Fuctional Use of Upper Extremity  SLP Social Cognition   Problem Solving, Memory, Attention, Awareness  TR      Anticipated Outcomes Item Anticipated Outcome  Self Feeding modified independent  Swallowing  Supervision    Basic self-care  supervision to min assist.  Toileting  supervision    Bathroom Transfers supervision  Bowel/Bladder  continent of bowel and bladder with min assist  Transfers  supervision  Locomotion  min assist ambulatory   Communication  Supervision   Cognition  Supervision   Pain  pain less than or equal to 4/10 with min assist  Safety/Judgment  free from falls/injury and making sound safety decisions with min asssist   Therapy Plan: PT Intensity: Minimum of 1-2 x/day ,45 to 90 minutes PT Frequency: 5 out of 7 days PT Duration Estimated Length of Stay: 16-18 days OT Intensity: Minimum of 1-2 x/day, 45 to 90 minutes OT Frequency: 5 out of 7 days OT Duration/Estimated Length of Stay: 16-18 days SLP Intensity: Minumum of 1-2 x/day, 30 to 90 minutes SLP Frequency: 1 to 3 out of 7 days SLP Duration/Estimated Length of Stay: 16-18 days        Team  Interventions: Nursing Interventions Patient/Family Education, Bladder Management, Bowel Management, Disease Management/Prevention, Pain Management, Medication Management, Skin Care/Wound Management, Cognitive Remediation/Compensation, Discharge Planning  PT interventions Ambulation/gait training, Community reintegration, DME/adaptive equipment instruction, Neuromuscular re-education, Psychosocial support, Stair training, UE/LE Strength taining/ROM, Wheelchair propulsion/positioning, UE/LE Coordination activities, Therapeutic Activities, Warden/rangerBalance/vestibular training, Discharge planning, Functional electrical stimulation, Cognitive remediation/compensation, Functional mobility training, Patient/family education, Splinting/orthotics, Therapeutic Exercise, Visual/perceptual remediation/compensation  OT Interventions Warden/rangerBalance/vestibular training, Cognitive remediation/compensation, Community reintegration, Discharge planning, Disease mangement/prevention, DME/adaptive equipment instruction, Functional mobility training, Neuromuscular re-education, Pain management, Patient/family education, Psychosocial support, Self Care/advanced ADL retraining, Therapeutic Activities, Therapeutic Exercise, UE/LE Strength taining/ROM, UE/LE Coordination activities, Visual/perceptual remediation/compensation, Wheelchair propulsion/positioning  SLP Interventions Cognitive remediation/compensation, Financial traderCueing hierarchy, Environmental controls, Functional tasks, Internal/external aids, Patient/family education, Other (comment), Speech/Language facilitation (RMT)  TR Interventions    SW/CM Interventions Discharge Planning, Psychosocial Support, Patient/Family Education    Team Discharge Planning: Destination: PT-Home ,OT- Home , SLP-Home Projected Follow-up: PT-Home health PT, 24 hour supervision/assistance, OT-  Home health OT, 24 hour supervision/assistance, SLP-24 hour supervision/assistance, Home Health SLP, Outpatient  SLP Projected Equipment Needs: PT-To be determined, OT- Tub/shower bench, SLP-None recommended by SLP Equipment Details: PT- , OT-  Patient/family involved in discharge planning: PT- Patient,  OT-Patient, Family member/caregiver, SLP-Patient unable/family or caregive not available  MD ELOS: 16-18d Medical Rehab Prognosis:  Good Assessment:  83 y.o.LH-malewith history of HTN, PAF, CVA '97, Parkinson's disease, Knee OA, who was admitted on 08/08/16 with left sided weakness and left gaze preference. CT head  reviewed, showing small right brainstem bleed. Per report, 11 X 10 X 18 mm hemorrhage in right pons and chronic ischemic injury with white matter gliosis and remote right lenticulostriate and right cerebellar strokes. Stroke felt to be hypertensive in origin and follow up CCT stable. He has had issues with confusion and hallucinations.   Speech therapy evaluation revealed decreased initiation, delayed processing, poor safety with severe cognitive impairments, lability and dysarthria. CIR recommended for follow up therapy. Therapy initiated and patient with significant posterior lean, right inattention with imbalance and inability to carry out ADL tasks   Now requiring 53/6 Rehab RN,MD, as well as CIR level PT, OT and SLP.  Treatment team will focus on ADLs and mobility with goals set at S/Min A See Team Conference Notes for weekly updates to the plan of care

## 2016-08-14 NOTE — Progress Notes (Signed)
Social Work Lucy Chrisebecca G Areesha Dehaven, LCSW Social Worker Signed   Patient Care Conference Date of Service: 08/14/2016  4:04 PM      Hide copied text Hover for attribution information Inpatient RehabilitationTeam Conference and Plan of Care Update Date: 08/14/2016   Time: 10:45 AM   Patient Name: [name]      Medical Record Number: [id number]  Date of Birth: 08/08/1930 Sex: Male         Room/Bed: 4W08C/4W08C-01 Payor Info: Payor: MEDICARE / Plan: MEDICARE PART A AND B / Product Type: *No Product type* /     Admitting Diagnosis: L   CVA  Admit Date/Time:  08/12/2016  3:07 PM Admission Comments: No comment available    Primary Diagnosis:  <principal problem not specified> Principal Problem: <principal problem not specified>       Patient Active Problem List    Diagnosis Date Noted  . 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 Hyperlipidemia 08/12/2016  . Hypertensive emergency    . Benign essential HTN    . PAF (paroxysmal atrial fibrillation) (HCC)    . History of CVA (cerebrovascular accident)    . Parkinson disease (HCC)    . Primary osteoarthritis of right knee    . Leukocytosis    . AKI (acute kidney injury) [facility](HCC)        Expected Discharge Date: Expected Discharge Date: 08/29/16   Team Members Present: Physician leading conference: Dr. Claudette LawsAndrew Kirsteins Social Worker Present: Dossie DerBecky Alicea Wente, LCSW PT Present: Wanda Plumparoline Cook, Antonietta JewelPT;Austin Tucker, PT OT Present: Perrin MalteseJames McGuire, OT SLP Present: Reuel DerbyHappi Overton, SLP PPS Coordinator present : Tora DuckMarie Noel, RN, CRRN       Current Status/Progress Goal Weekly Team Focus  Medical     dysphagia goals, decreased attn, intelligibility reduced, sleeps ok  resolve renal failure, resolve UTI  IV hydration med management   Bowel/Bladder     Continent of bowel, incontinent of bladder, LBM 08/11/16  continent of bowel & bladder  Monitor for patterns for individualized toileting schedule   Swallow/Nutrition/ Hydration    Dys.3 textures and thin liquids   least restrictive PO with Supervision   awareness of oral residue, recall and carryover of comepnsatory strategies to maximize safety with PO   ADL's     Min assist for UB bathing with max assist for dressing.  Total +2 (pt 30%) for LB selfcare sit to stand.  Increased pushing to the left in sitting and with standing.  Slight decreased coordination in the LUE but uses functionally at a diminshed level.  Decreased visual scanning and occular ROM.   supervison overall with min for dynamic standing balance  selfcare retraining, balance retraining, neuromuscular re-education, LUE coordination, therapeutic exercise, pt/family education   Mobility     Mod-max assist with transfer using RW. Gait with mod-max assist with RW due to pushing to the L. poor awareness of the LLE with gait and transfer.   Supervision min       Communication     Mostly single word responses with a few phrases with Max assist for intelligibility  Supervision   increase verbal responses and determine canidacy for RMT   Safety/Cognition/ Behavioral Observations   Max assist   Supervision   increase sustained attention, which will help all higher level cognitive functions   Pain     no c/o pain since admission, has tylenol 650 Q4hr prn   pain scale less than 3  Assess & treat as needed  Skin     various bruises, no skin breakdown  no new areas of skin breakdown  assess skin q shift     *See Care Plan and progress notes for long and short-term goals.   Barriers to Discharge: see above     Possible Resolutions to Barriers:  see above     Discharge Planning/Teaching Needs:  Home with wife who can provide supervision and son who is currently on a leave from work to assist Mom with Dad's care.      Team Discussion:  Medical issues with UTI and acute renal failure-starting IV fluids and antibiotics. Making progress toward supervision to min assist level goals. Endurance poor last two days  probably due to UTI. Wife and son to provide care to at home.  Revisions to Treatment Plan:  DC 3/22    Continued Need for Acute Rehabilitation Level of Care: The patient requires daily medical management by a physician with specialized training in physical medicine and rehabilitation for the following conditions: Daily direction of a multidisciplinary physical rehabilitation program to ensure safe treatment while eliciting the highest outcome that is of practical value to the patient.: Yes Daily medical management of patient stability for increased activity during participation in an intensive rehabilitation regime.: Yes Daily analysis of laboratory values and/or radiology reports with any subsequent need for medication adjustment of medical intervention for : Neurological problems;Renal problems   Lucy Chris 08/14/2016, 4:04 PM       Patient ID: [name], male   DOB: 1930/11/29, 83 y.o.   MRN: [id number]

## 2016-08-14 NOTE — Progress Notes (Signed)
Occupational Therapy Session Note  Patient Details  Name: [name] MRN: [id number] Date of Birth: 02-Jul-1930  Today's Date: 08/14/2016 OT Individual Time: 1300-1418 OT Individual Time Calculation (min): 78 min    Short Term Goals: Week 1:  OT Short Term Goal 1 (Week 1): Pt will maintain dynamic sitting balance during ADL tasks with no more than min assist.  OT Short Term Goal 2 (Week 1): Pt will complete UB dressing with min assist sitting unsupported.  OT Short Term Goal 3 (Week 1): Pt will complete LB bathing with mod assist sit to stand.   OT Short Term Goal 4 (Week 1): Pt will complete LB dressing with mod assist sit to stand.   OT Short Term Goal 5 (Week 1): Pt will complete toilet transfer with RW to 3:1 with mod assist 2 consecutive trials.   Skilled Therapeutic Interventions/Progress Updates:    Pt worked on bathing and dressing during session.  Mod assist for rolling to the left side with use of the rail, with min assist for transition from sidelying to sitting.  He was able to complete stand pivot transfers to the wheelchair and to the tub seat with mod assist.  Once in the shower pt exhibited increased kyphosis as well as cervical flexion.  Max instructional cueing to work on upright posture while engaged in bathing tasks.  Mod instructional cueing needed for sequencing bathing, including applying soap to the washcloth as well.  He was able to use the LUE spontaneously for washing the RUE and parts of the RLE.  Lateral lean to the left completed with min guard assist for washing his buttocks.  Mr. Lynford HumphreyDickens needed assist for rinsing secondary to time constraints and decreased sequencing.  Transferred stand pivot to the wheelchair at mod assist with transition to the sink for grooming and dressing tasks.  Encouraged crossing LEs over the opposite knee for threading pants.  Mod assist to donn the RLE after he successfully place the LLE in the pants leg.  Mod assist for standing to pull  them over the hips.  He needed setup of button up shirt with mod instructional cueing to donn as he initially attempted to donn his LUE in the shirt sleeve but missed it and needed the re-direction to start again. After placing the LUE in he required assistance with pulling the shirt around his back and then placing the RLE in the sleeve.  Max assist for fastening buttons.  Utilized LE crossing over his knee with min assist for donning shoes and then he was able to reach down to the floor to velcro them.  Prior to this therapist had to assist with donning socks secondary to him not being able to complete this on his own and the socks being smaller.  Discussed with wife about bringing larger socks in to try next visit.  He finished session with brushing his hair in sitting with setup of the comb and use of the RUE primarily.  Pt left in wheelchair with call button in reach and safety belt in place.      Therapy Documentation Precautions:  Precautions Precautions: Fall Precaution Comments: L/posterior lean worse with fatigue Restrictions Weight Bearing Restrictions: No  Pain: Pain Assessment Pain Assessment: No/denies pain ADL: ADL ADL Comments: See Functional Tool for details  See Function Navigator for Current Functional Status.   Therapy/Group: Individual Therapy  Ymani Porcher OTR/L 08/14/2016, 3:57 PM

## 2016-08-14 NOTE — Progress Notes (Signed)
Subjective/Complaints: PT is awake, alert, sitting in wheelchair. Undergoing OT evaluation of vision. OT notes that he does not look toward the right side very well.  Review of systems denies any pain with urination. No problems with his breathing. No joint pains.  Objective: Vital Signs: Blood pressure 136/69, pulse 79, temperature 100.1 F (37.8 C), temperature source Oral, resp. rate 17, height '5\' 9"'  (1.753 m), weight 82 kg (180 lb 12.4 oz), SpO2 99 %. Dg Chest 2 View  Result Date: 08/13/2016 CLINICAL DATA:  Elevated white blood cell count EXAM: CHEST  2 VIEW COMPARISON:  Chest x-ray of 09/05/2015 FINDINGS: There is little change in coarse markings diffusely throughout the lungs. No definite pleural effusion is seen and no fluid is noted within fissures. With little interval change in these prominent markings compared to a chest x-ray for 1 year ago, these changes most likely are chronic in nature. No definite congestive heart failure is seen although superimposed pneumonia would be difficult exclude. No bony abnormality is seen. IMPRESSION: Little change in prominent coarse lung markings most consistent with chronic fibrotic change. Difficult to exclude superimposed pneumonia. Electronically Signed   By: Ivar Drape M.D.   On: 08/13/2016 08:08   Results for orders placed or performed during the hospital encounter of 08/12/16 (from the past 72 hour(s))  Comprehensive metabolic panel     Status: Abnormal   Collection Time: 08/13/16  5:33 AM  Result Value Ref Range   Sodium 139 135 - 145 mmol/L   Potassium 5.1 3.5 - 5.1 mmol/L   Chloride 104 101 - 111 mmol/L   CO2 24 22 - 32 mmol/L   Glucose, Bld 128 (H) 65 - 99 mg/dL   BUN 40 (H) 6 - 20 mg/dL   Creatinine, Ser 3.95 (H) 0.61 - 1.24 mg/dL   Calcium 9.3 8.9 - 10.3 mg/dL   Total Protein 6.5 6.5 - 8.1 g/dL   Albumin 3.2 (L) 3.5 - 5.0 g/dL   AST 34 15 - 41 U/L   ALT 16 (L) 17 - 63 U/L   Alkaline Phosphatase 49 38 - 126 U/L   Total  Bilirubin 1.2 0.3 - 1.2 mg/dL   GFR calc non Af Amer 13 (L) >60 mL/min   GFR calc Af Amer 15 (L) >60 mL/min    Comment: (NOTE) The eGFR has been calculated using the CKD EPI equation. This calculation has not been validated in all clinical situations. eGFR's persistently <60 mL/min signify possible Chronic Kidney Disease.    Anion gap 11 5 - 15  CBC WITH DIFFERENTIAL     Status: Abnormal   Collection Time: 08/13/16  5:33 AM  Result Value Ref Range   WBC 19.9 (H) 4.0 - 10.5 K/uL   RBC 4.72 4.22 - 5.81 MIL/uL   Hemoglobin 15.2 13.0 - 17.0 g/dL   HCT 43.7 39.0 - 52.0 %   MCV 92.6 78.0 - 100.0 fL   MCH 32.2 26.0 - 34.0 pg   MCHC 34.8 30.0 - 36.0 g/dL   RDW 14.1 11.5 - 15.5 %   Platelets 180 150 - 400 K/uL   Neutrophils Relative % 85 %   Neutro Abs 16.7 (H) 1.7 - 7.7 K/uL   Lymphocytes Relative 5 %   Lymphs Abs 1.1 0.7 - 4.0 K/uL   Monocytes Relative 10 %   Monocytes Absolute 2.1 (H) 0.1 - 1.0 K/uL   Eosinophils Relative 0 %   Eosinophils Absolute 0.1 0.0 - 0.7 K/uL   Basophils Relative  0 %   Basophils Absolute 0.0 0.0 - 0.1 K/uL  Urinalysis, Routine w reflex microscopic     Status: Abnormal   Collection Time: 08/13/16  3:43 PM  Result Value Ref Range   Color, Urine YELLOW YELLOW   APPearance HAZY (A) CLEAR   Specific Gravity, Urine 1.015 1.005 - 1.030   pH 5.0 5.0 - 8.0   Glucose, UA NEGATIVE NEGATIVE mg/dL   Hgb urine dipstick LARGE (A) NEGATIVE   Bilirubin Urine NEGATIVE NEGATIVE   Ketones, ur NEGATIVE NEGATIVE mg/dL   Protein, ur 30 (A) NEGATIVE mg/dL   Nitrite NEGATIVE NEGATIVE   Leukocytes, UA MODERATE (A) NEGATIVE   RBC / HPF 6-30 0 - 5 RBC/hpf   WBC, UA TOO NUMEROUS TO COUNT 0 - 5 WBC/hpf   Bacteria, UA MANY (A) NONE SEEN   Squamous Epithelial / LPF NONE SEEN NONE SEEN     HEENT: Poor dentition, upper earlobe with dermatitis Cardio: RRR and No murmur Resp: CTA B/L and Unlabored GI: BS positive and Nontender, nondistended Extremity:  No Edema Skin:   Other  Dry with poor toenail care Neuro: Lethargic, Abnormal Sensory Withdraws to pinch. Bilateral upper and lower limbs, further assessment not possible due to mental status and Abnormal Motor Patient does move antigravity. 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 No pain with passive range of motion Gen. no acute distress   Assessment/Plan: 1. 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 Physiatrist is providing close team supervision and 24 hour management of active medical problems listed below. Physiatrist and rehab team continue to assess barriers to discharge/monitor patient progress toward functional and medical goals. FIM: Function - Bathing Position: Sitting EOB Body parts bathed by patient: Right arm, Left arm, Chest, Abdomen, Right upper leg, Left upper leg Body parts bathed by helper: Back, Front perineal area, Buttocks, Right lower leg, Left lower leg Assist Level: 2 helpers  Function- Upper Body Dressing/Undressing What is the patient wearing?: Pull over shirt/dress Pull over shirt/dress - Perfomed by helper: Thread/unthread right sleeve, Thread/unthread left sleeve, Put head through opening, Pull shirt over trunk Function - Lower Body Dressing/Undressing What is the patient wearing?: Pants, Non-skid slipper socks Position: Sitting EOB Pants- Performed by helper: Thread/unthread right pants leg, Thread/unthread left pants leg, Pull pants up/down, Fasten/unfasten pants Non-skid slipper socks- Performed by helper: Don/doff right sock, Don/doff left sock Assist for lower body dressing: 2 Helpers  Function - Toileting Toileting activity did not occur: Safety/medical concerns Toileting steps completed by helper: Adjust  clothing prior to toileting, Performs perineal hygiene, Adjust clothing after toileting Toileting Assistive Devices: Toilet aid Assist level: Two helpers  Function - Air cabin crew transfer activity did not occur: Safety/medical concerns  Function - Chair/bed transfer Chair/bed transfer activity did not occur: Safety/medical concerns Chair/bed transfer method: Stand pivot Chair/bed transfer assist level: Maximal assist (Pt 25 - 49%/lift and lower)     Function - Comprehension Comprehension: Auditory Comprehension assist level: Understands basic 50 - 74% of the time/ requires cueing 25 - 49% of the time  Function - Expression Expression: Verbal Expression assist level: Expresses basic 25 - 49% of the time/requires cueing 50 - 75% of the time. Uses single words/gestures.  Function - Social Interaction Social Interaction assist level: Interacts  appropriately 25 - 49% of time - Needs frequent redirection.  Function - Problem Solving Problem solving assist level: Solves basic less than 25% of the time - needs direction nearly all the time or does not effectively solve problems and may need a restraint for safety  Function - Memory Memory assist level: Recognizes or recalls 25 - 49% of the time/requires cueing 50 - 75% of the time Patient normally able to recall (first 3 days only): That he or she is in a hospital  Medical Problem List and Plan: 1.  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  Team conference today please see physician documentation under team conference tab, met with team face-to-face to discuss problems,progress, and goals. Formulized individual treatment plan based on medical history, underlying problem and comorbidities. 2.  DVT Prophylaxis/Anticoagulation: Pharmaceutical: Lovenox 3. Pain Management: Tylenol prn for knee pain 4. Mood: LCSW to follow for evaluation and support.  5. Neuropsych: This  patient is not fully capable of making decisions on his own behalf. 6. Skin/Wound Care: routine pressure relief  7. Fluids/Electrolytes/Nutrition: Monitor I/O. IV fluids 8. PAF: Monitor HR bid.  Vitals:   08/13/16 1504 08/14/16 0550  BP: 124/69 136/69  Pulse: 78 79  Resp: 18 17  Temp: 97.7 F (36.5 C) 100.1 F (37.8 C)   9. HTN: Monitor BP. Cont meds, may need to increase B blocker, has prn catapres 10. Knee OA: Meds as necessary 11. Parkinson's disease: Cont to monitor 12. Leukocytosis With reduced level of alertness: Follow CBC, afebrile . Chest x-ray findings review do not think this is source, UA positive, culture pnd   13. AKI IVF for pre renal, not on nephrotoxic meds  LOS (Days) 2 A FACE TO FACE EVALUATION WAS PERFORMED  Janessa Mickle E 08/14/2016, 9:10 AM

## 2016-08-14 NOTE — Progress Notes (Signed)
Occupational Therapy Session Note  Patient Details  Name: [name] MRN: [id number] Date of Birth: 02-Mar-1931  Today's Date: 08/14/2016 OT Individual Time: [id number] OT Individual Time Calculation (min): 31 min    Short Term Goals: Week 1:  OT Short Term Goal 1 (Week 1): Pt will maintain dynamic sitting balance during ADL tasks with no more than min assist.  OT Short Term Goal 2 (Week 1): Pt will complete UB dressing with min assist sitting unsupported.  OT Short Term Goal 3 (Week 1): Pt will complete LB bathing with mod assist sit to stand.   OT Short Term Goal 4 (Week 1): Pt will complete LB dressing with mod assist sit to stand.   OT Short Term Goal 5 (Week 1): Pt will complete toilet transfer with RW to 3:1 with mod assist 2 consecutive trials.   Skilled Therapeutic Interventions/Progress Updates:   Worked on further evaluation of vision from wheelchair level during session.  Pt with head tilt and slight rotation to the left.  Near visual acuity was at 4.0 m with far vision at 20/80 for both eyes.  Pt wore his glasses for near vision but did not need them for far vision.  Restricted AROM noted in superior quadrant in both the left and right hemispheres.  Pt with increased difficulty scanning to the right with either eye across midline.  He was able to scan further to the right with the left eye.  Both eyes would scan across midline to the left with noted end range horizontal nystagmus in the left eye.  Pt with reports of increased blurriness with distance vision as well since this CVA.   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  Attempted line bisection test as well.  Pt with questionable right visual field deficit as he did not center dot during test and it was placed slightly more to the left.  Will continue to assess in functional capacity.  Pt left in wheelchair with safety belt in place and call button in reach to conclude session.    Therapy  Documentation Precautions:  Precautions Precautions: Fall Precaution Comments: L/posterior lean worse with fatigue Restrictions Weight Bearing Restrictions: No  Pain: Pain Assessment Pain Assessment: No/denies pain ADL: See Function Navigator for Current Functional Status.   Therapy/Group: Individual Therapy  Yamen Castrogiovanni OTR/L 08/14/2016, 12:18 PM

## 2016-08-14 NOTE — Progress Notes (Signed)
Speech Language Pathology Daily Session Note  Patient Details  Name: Para Skeanslbert Coggeshall MRN: [id number] Date of Birth: September 26, 1930  Today's Date: 08/14/2016 SLP Individual Time: 1530-1600 SLP Individual Time Calculation (min): 30 min  Short Term Goals: Week 1: SLP Short Term Goal 1 (Week 1): Patient will solve basic problems realted to self-care with Mod assist  SLP Short Term Goal 2 (Week 1): Patient will verbally identify 2 physical and 2 cognitive changes wtih Mod question cues  SLP Short Term Goal 3 (Week 1): Patient will consume Dys.3 textures and thin liquids with Mod assist verbal question cues to recall and utilize safe swallow strategeis to manage oral residue and prevent overt s/s of aspiration.   SLP Short Term Goal 4 (Week 1): Patient will sustain attention to familiar tasks for 10-20 minutes with Mod assist verbal cues for redirection SLP Short Term Goal 5 (Week 1): Patient will utilize speech intelligibilty strategies at the phrase-sentence level of verbal expression with Mod assist verbal cues   Skilled Therapeutic Interventions: Skilled treatment session focused on cognition goals. SLP facilitated session by providing Max A cues to locate SLP to his right. Once pt located SLP, his face/vision drifted to left even though SLP continually talking to pt. Pt sustained attention to familiar tasks for 15 minutes with Min A verbal cues. Pt with decreased speech intelligibility at the simple phrase level ~50% with Mod A verbal cues. Pt left upright in wheelchair, safety belt donned and all needs within reach. [name] present. Continue per current plan of care.      Function:  Cognition Comprehension Comprehension assist level: Understands basic 75 - 89% of the time/ requires cueing 10 - 24% of the time  Expression   Expression assist level: Expresses basic 50 - 74% of the time/requires cueing 25 - 49% of the time. Needs to repeat parts of sentences.  Social Interaction Social Interaction  assist level: Interacts appropriately 75 - 89% of the time - Needs redirection for appropriate language or to initiate interaction.  Problem Solving Problem solving assist level: Solves basic 50 - 74% of the time/requires cueing 25 - 49% of the time  Memory Memory assist level: Recognizes or recalls 50 - 74% of the time/requires cueing 25 - 49% of the time    Pain Pain Assessment Pain Assessment: No/denies pain  Therapy/Group: Individual Therapy   Deakin Lacek B. Dreama Saaverton, M.S., CCC-SLP Speech-Language Pathologist   Ellyn Rubiano 08/14/2016, 4:52 PM

## 2016-08-14 NOTE — Progress Notes (Signed)
Physical Therapy Session Note  Patient Details  Name: [name] MRN: [id number] Date of Birth: 1930/08/15  Today's Date: 08/14/2016 PT Individual Time: 0801-0901 PT Individual Time Calculation (min): 60 min   Short Term Goals: Week 1:  PT Short Term Goal 1 (Week 1): Pt will maintain midline orientation in sitting x5 minutes with min cues PT Short Term Goal 2 (Week 1): Pt will self propel w/c x100' with min assist PT Short Term Goal 3 (Week 1): Pt will negotiate 4 steps with 1 assist   Skilled Therapeutic Interventions/Progress Updates:   Pt received supine in bed and agreeable to PT. Supine>sit transfer with mod assist and max multimodal cues for sequencing, proper use of Bead features and improved weight shift to the R.   Stand pivot transfer with mod assist to [facility] with moderate cues for RW management and proper gait pattern.   WC mobility x 70f with mod assist from PT to prevent veer to the L and max cues for awareness of wall on R side.   Gait training x57fwith mod assist progressing to Total assist +2 on turn to mat table to prevent fall. Constant cues and standing rest break to improve Terminal knee extension on the LLE and equalize WB through BLE  Sitting balance with visual feedback from Mirror x 15 min mod assist progressing to Supervision assist from PT with PT to prevent terminal elbow extension on the RUE and encourage WB through RLE. Weight shift A/P and R/L with max cues to weight shift beyond midline. Patient noted to have difficult seeing targets on R with reach while weight shifting    Blocked practiceSit<>stand with RW and mod progressing to min assist from PT with moderate cues for improved anterior weight shift to prevent LOB.    PT instructed patient in Car transfer with max assist due to Pushing to the L throughout transfers and preventing Lateral/posterior LOB.   [facility] and all needs met.            Therapy Documentation Precautions:  Precautions Precautions: Fall Precaution Comments: L/posterior lean worse with fatigue Restrictions Weight Bearing Restrictions: No General:   Vital Signs: Therapy Vitals Temp: 100.1 F (37.8 C) Temp Source: Oral Pulse Rate: 79 Resp: 17 BP: 136/69 Patient Position (if appropriate): Lying Oxygen Therapy SpO2: 99 % O2 Device: Not Delivered Pain: 0/10   See Function Navigator for Current Functional Status.   Therapy/Group: Individual Therapy  AuLorie Phenix/12/2016, 8:06 AM

## 2016-08-14 NOTE — Patient Care Conference (Signed)
Inpatient RehabilitationTeam Conference and Plan of Care Update Date: 08/14/2016   Time: 10:45 AM  Patient Name: [name]      Medical Record Number: [id number]  Date of Birth: 1931/05/18 Sex: Male         Room/Bed: 4W08C/4W08C-01 Payor Info: Payor: MEDICARE / Plan: MEDICARE PART A AND B / Product Type: *No Product type* /    Admitting Diagnosis: L   CVA  Admit Date/Time:  08/12/2016  3:07 PM Admission Comments: No comment available   Primary Diagnosis:  <principal problem not specified> Principal Problem: <principal problem not specified>  Patient Active Problem List   Diagnosis Date Noted  . 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 Hyperlipidemia 08/12/2016  . Hypertensive emergency   . Benign essential HTN   . PAF (paroxysmal atrial fibrillation) (HCC)   . History of CVA (cerebrovascular accident)   . Parkinson disease (HCC)   . Primary osteoarthritis of right knee   . Leukocytosis   . AKI (acute kidney injury) [facility])     Expected Discharge Date: Expected Discharge Date: 08/29/16  Team Members Present: Physician leading conference: Dr. Claudette Laws Social Worker Present: Dossie Der, LCSW PT Present: Wanda Plump, Antonietta Jewel, PT OT Present: Perrin Maltese, OT SLP Present: Reuel Derby, SLP PPS Coordinator present : Tora Duck, RN, CRRN     Current Status/Progress Goal Weekly Team Focus  Medical   dysphagia goals, decreased attn, intelligibility reduced, sleeps ok  resolve renal failure, resolve UTI  IV hydration med management   Bowel/Bladder   Continent of bowel, incontinent of bladder, LBM 08/11/16  continent of bowel & bladder  Monitor for patterns for individualized toileting schedule   Swallow/Nutrition/ Hydration   Dys.3 textures and thin liquids   least restrictive PO with Supervision   awareness of oral residue, recall and carryover of comepnsatory strategies to maximize safety with PO   ADL's   Min assist for UB  bathing with max assist for dressing.  Total +2 (pt 30%) for LB selfcare sit to stand.  Increased pushing to the left in sitting and with standing.  Slight decreased coordination in the LUE but uses functionally at a diminshed level.  Decreased visual scanning and occular ROM.   supervison overall with min for dynamic standing balance  selfcare retraining, balance retraining, neuromuscular re-education, LUE coordination, therapeutic exercise, pt/family education   Mobility   Mod-max assist with transfer using RW. Gait with mod-max assist with RW due to pushing to the L. poor awareness of the LLE with gait and transfer.   Supervision min       Communication   Mostly single word responses with a few phrases with Max assist for intelligibility  Supervision   increase verbal responses and determine canidacy for RMT   Safety/Cognition/ Behavioral Observations  Max assist   Supervision   increase sustained attention, which will help all higher level cognitive functions   Pain   no c/o pain since admission, has tylenol 650 Q4hr prn   pain scale less than 3  Assess & treat as needed   Skin   various bruises, no skin breakdown  no new areas of skin breakdown  assess skin q shift      *See Care Plan and progress notes for long and short-term goals.  Barriers to Discharge: see above    Possible Resolutions to Barriers:  see above    Discharge Planning/Teaching Needs:  Home with wife who can provide supervision and son  who is currently on a leave from work to assist Mom with Dad's care.      Team Discussion:  Medical issues with UTI and acute renal failure-starting IV fluids and antibiotics. Making progress toward supervision to min assist level goals. Endurance poor last two days probably due to UTI. Wife and son to provide care to at home.  Revisions to Treatment Plan:  DC 3/22   Continued Need for Acute Rehabilitation Level of Care: The patient requires daily medical management by a physician  with specialized training in physical medicine and rehabilitation for the following conditions: Daily direction of a multidisciplinary physical rehabilitation program to ensure safe treatment while eliciting the highest outcome that is of practical value to the patient.: Yes Daily medical management of patient stability for increased activity during participation in an intensive rehabilitation regime.: Yes Daily analysis of laboratory values and/or radiology reports with any subsequent need for medication adjustment of medical intervention for : Neurological problems;Renal problems  Lucy ChrisDupree, Devonne Kitchen G 08/14/2016, 4:04 PM

## 2016-08-15 ENCOUNTER — Inpatient Hospital Stay (HOSPITAL_COMMUNITY): Payer: Medicare Other | Admitting: Occupational Therapy

## 2016-08-15 ENCOUNTER — Ambulatory Visit (HOSPITAL_COMMUNITY): Payer: Medicare Other | Admitting: Physical Therapy

## 2016-08-15 ENCOUNTER — Inpatient Hospital Stay (HOSPITAL_COMMUNITY): Payer: Medicare Other | Admitting: Speech Pathology

## 2016-08-15 ENCOUNTER — Inpatient Hospital Stay (HOSPITAL_COMMUNITY): Payer: Medicare Other | Admitting: Physical Therapy

## 2016-08-15 DIAGNOSIS — N189 Chronic kidney disease, unspecified: Secondary | ICD-10-CM

## 2016-08-15 DIAGNOSIS — N3000 Acute cystitis without hematuria: Secondary | ICD-10-CM

## 2016-08-15 DIAGNOSIS — N179 Acute kidney failure, unspecified: Secondary | ICD-10-CM

## 2016-08-15 LAB — BASIC METABOLIC PANEL
ANION GAP: 8 (ref 5–15)
BUN: 44 mg/dL — ABNORMAL HIGH (ref 6–20)
CO2: 24 mmol/L (ref 22–32)
Calcium: 8.5 mg/dL — ABNORMAL LOW (ref 8.9–10.3)
Chloride: 104 mmol/L (ref 101–111)
Creatinine, Ser: 1.71 mg/dL — ABNORMAL HIGH (ref 0.61–1.24)
GFR, EST AFRICAN AMERICAN: 41 mL/min — AB (ref >60)
GFR, EST NON AFRICAN AMERICAN: 35 mL/min — AB (ref >60)
Glucose, Bld: 88 mg/dL (ref 65–99)
POTASSIUM: 3.6 mmol/L (ref 3.5–5.1)
SODIUM: 136 mmol/L (ref 135–145)

## 2016-08-15 NOTE — Progress Notes (Signed)
Occupational Therapy Session Note  Patient Details  Name: Para Skeanslbert Mordan MRN: [id number] Date of Birth: [birth year]  Today's Date: 08/15/2016 OT Individual Time: 0902-1006 OT Individual Time Calculation (min): 64 min    Short Term Goals: Week 1:  OT Short Term Goal 1 (Week 1): Pt will maintain dynamic sitting balance during ADL tasks with no more than min assist.  OT Short Term Goal 2 (Week 1): Pt will complete UB dressing with min assist sitting unsupported.  OT Short Term Goal 3 (Week 1): Pt will complete LB bathing with mod assist sit to stand.   OT Short Term Goal 4 (Week 1): Pt will complete LB dressing with mod assist sit to stand.   OT Short Term Goal 5 (Week 1): Pt will complete toilet transfer with RW to 3:1 with mod assist 2 consecutive trials.   Skilled Therapeutic Interventions/Progress Updates:    Pt completed self feeding to start session.  Pt only eating a limited amount but did drink his orange juice and coffee with the LUE, positioned on the left side of the tray.  He was able to scoop up some eggs and a small piece of sausage with supervision, using the fork and the RUE.  He needed mod instructional cueing for adhering to his swallowing precautions.  Progressed to washing up at the sink.  He was able to wash his face, arms, and underarms with supervision.  Removed dirty clothing and brief with mod assist for standing balance.  Increased lean to the right noted in standing with max instructional cueing to use the mirror for feedback on standing posture.  Once looking at the mirror he could correct midline orientation with min assist.  He was able to maintain standing with min assist to complete washing of peri area and buttocks.  Therapist assisted with all aspects of donning the button up shirt, secondary to IV and decreased time.  He was able to donn both pants legs over his feet with min assist and then needed mod assist for pulling over his hips.  Therapist assisted with donning  socks and shoes.  Pt left in wheelchair with safety belt in place and call button in reach.  Wife also present as well.  She plans to bring his electric razor next visit.  Pt still with left head turn and gaze with decreased ability visually scan left of midline.  Maintains flexed posture in trunk and head.    Therapy Documentation Precautions:  Precautions Precautions: Fall Precaution Comments: L/posterior lean worse with fatigue Restrictions Weight Bearing Restrictions: No  Pain: Pain Assessment Pain Assessment: No/denies pain ADL: ADL ADL Comments: See Functional Tool for details   See Function Navigator for Current Functional Status.   Therapy/Group: Individual Therapy  Zayaan Kozak OTR/L 08/15/2016, 12:42 PM

## 2016-08-15 NOTE — Progress Notes (Signed)
Speech Language Pathology Daily Session Note  Patient Details  Name: [name] MRN: [id number] Date of Birth: 04/28/1931  Today's Date: 08/15/2016 SLP Individual Time: 1130-1200 SLP Individual Time Calculation (min): 30 min  Short Term Goals: Week 1: SLP Short Term Goal 1 (Week 1): Patient will solve basic problems realted to self-care with Mod assist  SLP Short Term Goal 2 (Week 1): Patient will verbally identify 2 physical and 2 cognitive changes wtih Mod question cues  SLP Short Term Goal 3 (Week 1): Patient will consume Dys.3 textures and thin liquids with Mod assist verbal question cues to recall and utilize safe swallow strategeis to manage oral residue and prevent overt s/s of aspiration.   SLP Short Term Goal 4 (Week 1): Patient will sustain attention to familiar tasks for 10-20 minutes with Mod assist verbal cues for redirection SLP Short Term Goal 5 (Week 1): Patient will utilize speech intelligibilty strategies at the phrase-sentence level of verbal expression with Mod assist verbal cues   Skilled Therapeutic Interventions: Skilled treatment session focused on cognition goals. SLP facilitated session by providing Min A verbal cues to sustain attention to familiar tasks for 15 minutes. Pt able to solve basic problems related to sorting letters including scanning to right of environment with Min A verbal cues to supervision. [name] present during session and all questions answered to pt and wife satisfaction. Pt able to communicate with clear speech at the phrase level with Min A verbal cues and ~ 90% intelligibility. Pt left upright in wheelchair, wife and pastor present and all needs within reach. Continue current plan of care.      Function:  Eating Eating     Eating Assist Level: Supervision or verbal cues           Cognition Comprehension Comprehension assist level: Understands basic 75 - 89% of the time/ requires cueing 10 - 24% of the time  Expression    Expression assist level: Expresses basic 75 - 89% of the time/requires cueing 10 - 24% of the time. Needs helper to occlude trach/needs to repeat words.  Social Interaction Social Interaction assist level: Interacts appropriately 75 - 89% of the time - Needs redirection for appropriate language or to initiate interaction.  Problem Solving Problem solving assist level: Solves basic 50 - 74% of the time/requires cueing 25 - 49% of the time  Memory Memory assist level: Recognizes or recalls 50 - 74% of the time/requires cueing 25 - 49% of the time    Pain    Therapy/Group: Individual Therapy   Catherine Cubero B. Dreama Saaverton, M.S., CCC-SLP Speech-Language Pathologist   Oluwatosin Higginson 08/15/2016, 2:16 PM

## 2016-08-15 NOTE — Progress Notes (Signed)
Physical Therapy Session Note  Patient Details  Name: [name] MRN: [id number] Date of Birth: 11/06/1930  Today's Date: 08/15/2016 PT Individual Time: 0905-1004 PT Individual Time Calculation (min): 59 min   Short Term Goals: Week 1:  PT Short Term Goal 1 (Week 1): Pt will maintain midline orientation in sitting x5 minutes with min cues PT Short Term Goal 2 (Week 1): Pt will self propel w/c x100' with min assist PT Short Term Goal 3 (Week 1): Pt will negotiate 4 steps with 1 assist   Skilled Therapeutic Interventions/Progress Updates:   Pt received supine in bed and agreeable to PT. Supine>sit transfer with min assist, heavy use of bed rails and mod cues for posture and positioning of BLE.   Squat pivot transfer x 5 throughout treatment with mod assist from PT for improved WB over the RLE.   Sitting balance EOB for PT to assist pt to don pants. Supervision assist while PT provided max assist to thread pants over feet for time mangement. Sit<>stand with min assist to pull pants to waist. withincreased time, Max assist to prevent Lateral LOB.   WC mobility x 162f with supervision assist using BUE and BLE for prolusion. Min cues for improved use of the LUE.   Blocked practice Sit<>stand with mod progressing to min assist with max cues for anterior weight shift and proper LE positioning to prevent pushing tendencies.   Standing balance with min progressing to supervision assist for Lateral weight shifting. Foot taps on 2 inch step with mod assist from PT to encourage weight bearing over the RLE. Pt required constant visual feedback to prevent L LOB and maintain equal weight bearing.   Sitting balance at EOB to place 8 horse shoes on basketball rim with R/anterior reach. Moderate verbal and visual cues for awareness of ability to improve weight shift to R.   [facility] and all needs met.          Therapy  Documentation Precautions:  Precautions Precautions: Fall Precaution Comments: L/posterior lean worse with fatigue Restrictions Weight Bearing Restrictions: No  Vital Signs: Therapy Vitals Temp: 98.8 F (37.1 C) Temp Source: Oral Pulse Rate: 78 Resp: 18 BP: 134/62 Patient Position (if appropriate): Lying Oxygen Therapy SpO2: 96 % O2 Device: Not Delivered Pain: Pain Assessment Pain Assessment: No/denies pain   See Function Navigator for Current Functional Status.   Therapy/Group: Individual Therapy  ALorie Phenix3/01/2017, 9:51 AM

## 2016-08-15 NOTE — Progress Notes (Signed)
Occupational Therapy Session Note  Patient Details  Name: [name] MRN: [id number] Date of Birth: 1930-10-31  Today's Date: 08/15/2016 OT Individual Time: 1420-1502 OT Individual Time Calculation (min): 42 min    Short Term Goals: Week 1:  OT Short Term Goal 1 (Week 1): Pt will maintain dynamic sitting balance during ADL tasks with no more than min assist.  OT Short Term Goal 2 (Week 1): Pt will complete UB dressing with min assist sitting unsupported.  OT Short Term Goal 3 (Week 1): Pt will complete LB bathing with mod assist sit to stand.   OT Short Term Goal 4 (Week 1): Pt will complete LB dressing with mod assist sit to stand.   OT Short Term Goal 5 (Week 1): Pt will complete toilet transfer with RW to 3:1 with mod assist 2 consecutive trials.   Skilled Therapeutic Interventions/Progress Updates:    Therapist rolled pt to the therapy gym via wheelchair and completed transfer to the therapy mat with mod assist stand pivot.  Worked on maintaining anterior pelvic tilt in sitting while having pt scan over bedside table to locate and pick up small plastic colored pieces with the RUE.  He needed mod facilitation to maintain upright posture, as he tends to fall into posterior pelvic tilt with cervical flexion.  He was able to locate all items requested from therapist with increased time and then place them, one at a time, in appropriate container, with emphasis on lateral weightshift to the right.  Pt with increase scanning to the right noted and some occular AROM noted to the left of midline, but still restricted.  Transitioned to standing as well to complete task but instead had pt use the LUE to pick up pieces and place in container at midline.  Pt with greater difficulty picking up pieces with the LUE compared to the right, and demonstrated decreased active shoulder flexion when reaching to place them.  Mod facilitation at the left knee and trunk to maintain knee extension and trunk extension  during reaching.  Finished session with transfer stand pivot back to the wheelchair with mod assist.  Safety belt in place.  Pt taken in day room for CVA support group.  Wife present throughout session.      Therapy Documentation Precautions:  Precautions Precautions: Fall Precaution Comments: L/posterior lean worse with fatigue Restrictions Weight Bearing Restrictions: No  Pain: Pain Assessment Pain Assessment: No/denies pain ADL: See Function Navigator for Current Functional Status.   Therapy/Group: Individual Therapy  Alyss Granato OTR/L 08/15/2016, 4:04 PM

## 2016-08-15 NOTE — Progress Notes (Signed)
Subjective/Complaints: Patient is alert this morning. No specific complaints. No pain with urination   Review of systems denies any pain with urination. No problems with his breathing. No joint pains.  Objective: Vital Signs: Blood pressure (!) 110/58, pulse 72, temperature 97.6 F (36.4 C), temperature source Oral, resp. rate 18, height _0  (1.753 m), weight 82 kg (180 lb 12.4 oz), SpO2 97 %. Dg Chest 2 View  Result Date: 08/13/2016 CLINICAL DATA:  Elevated white blood cell count EXAM: CHEST  2 VIEW COMPARISON:  Chest x-ray of 09/05/2015 FINDINGS: There is little change in coarse markings diffusely throughout the lungs. No definite pleural effusion is seen and no fluid is noted within fissures. With little interval change in these prominent markings compared to a chest x-ray for 1 year ago, these changes most likely are chronic in nature. No definite congestive heart failure is seen although superimposed pneumonia would be difficult exclude. No bony abnormality is seen. IMPRESSION: Little change in prominent coarse lung markings most consistent with chronic fibrotic change. Difficult to exclude superimposed pneumonia. Electronically Signed   By: Ivar Drape M.D.   On: 08/13/2016 08:08   Results for orders placed or performed during the hospital encounter of 08/12/16 (from the past 72 hour(s))  Comprehensive metabolic panel     Status: Abnormal   Collection Time: 08/13/16  5:33 AM  Result Value Ref Range   Sodium 139 135 - 145 mmol/L   Potassium 5.1 3.5 - 5.1 mmol/L   Chloride 104 101 - 111 mmol/L   CO2 24 22 - 32 mmol/L   Glucose, Bld 128 (H) 65 - 99 mg/dL   BUN 40 (H) 6 - 20 mg/dL   Creatinine, Ser 3.95 (H) 0.61 - 1.24 mg/dL   Calcium 9.3 8.9 - 10.3 mg/dL   Total Protein 6.5 6.5 - 8.1 g/dL   Albumin 3.2 (L) 3.5 - 5.0 g/dL   AST 34 15 - 41 U/L   ALT 16 (L) 17 - 63 U/L   Alkaline Phosphatase 49 38 - 126 U/L   Total Bilirubin 1.2 0.3 - 1.2 mg/dL   GFR calc non Af Amer 13 (L) >60  mL/min   GFR calc Af Amer 15 (L) >60 mL/min    Comment: (NOTE) The eGFR has been calculated using the CKD EPI equation. This calculation has not been validated in all clinical situations. eGFR's persistently <60 mL/min signify possible Chronic Kidney Disease.    Anion gap 11 5 - 15  CBC WITH DIFFERENTIAL     Status: Abnormal   Collection Time: 08/13/16  5:33 AM  Result Value Ref Range   WBC 19.9 (H) 4.0 - 10.5 K/uL   RBC 4.72 4.22 - 5.81 MIL/uL   Hemoglobin 15.2 13.0 - 17.0 g/dL   HCT 43.7 39.0 - 52.0 %   MCV 92.6 78.0 - 100.0 fL   MCH 32.2 26.0 - 34.0 pg   MCHC 34.8 30.0 - 36.0 g/dL   RDW 14.1 11.5 - 15.5 %   Platelets 180 150 - 400 K/uL   Neutrophils Relative % 85 %   Neutro Abs 16.7 (H) 1.7 - 7.7 K/uL   Lymphocytes Relative 5 %   Lymphs Abs 1.1 0.7 - 4.0 K/uL   Monocytes Relative 10 %   Monocytes Absolute 2.1 (H) 0.1 - 1.0 K/uL   Eosinophils Relative 0 %   Eosinophils Absolute 0.1 0.0 - 0.7 K/uL   Basophils Relative 0 %   Basophils Absolute 0.0 0.0 - 0.1 K/uL  Urinalysis, Routine w reflex microscopic     Status: Abnormal   Collection Time: 08/13/16  3:43 PM  Result Value Ref Range   Color, Urine YELLOW YELLOW   APPearance HAZY (A) CLEAR   Specific Gravity, Urine 1.015 1.005 - 1.030   pH 5.0 5.0 - 8.0   Glucose, UA NEGATIVE NEGATIVE mg/dL   Hgb urine dipstick LARGE (A) NEGATIVE   Bilirubin Urine NEGATIVE NEGATIVE   Ketones, ur NEGATIVE NEGATIVE mg/dL   Protein, ur 30 (A) NEGATIVE mg/dL   Nitrite NEGATIVE NEGATIVE   Leukocytes, UA MODERATE (A) NEGATIVE   RBC / HPF 6-30 0 - 5 RBC/hpf   WBC, UA TOO NUMEROUS TO COUNT 0 - 5 WBC/hpf   Bacteria, UA MANY (A) NONE SEEN   Squamous Epithelial / LPF NONE SEEN NONE SEEN  Urine culture     Status: Abnormal   Collection Time: 08/13/16  3:43 PM  Result Value Ref Range   Specimen Description URINE, CATHETERIZED    Special Requests NONE    Culture MULTIPLE SPECIES PRESENT, SUGGEST RECOLLECTION (A)    Report Status 08/14/2016  FINAL      HEENT: Poor dentition, upper earlobe with dermatitis Cardio: RRR and No murmur Resp: CTA B/L and Unlabored GI: BS positive and Nontender, nondistended Extremity:  No Edema Skin:   Other Dry with poor toenail care Neuro: Lethargic, Abnormal Sensory Withdraws to pinch. Bilateral upper and lower limbs, further assessment not possible due to mental status and Abnormal Motor Patient does move antigravity. 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 No pain with passive range of motion Gen. no acute distress   Assessment/Plan: 1. 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 Physiatrist is providing close team supervision and 24 hour management of active medical problems listed below. Physiatrist and rehab team continue to assess barriers to discharge/monitor patient progress toward functional and medical goals. FIM: Function - Bathing Position: Shower Body parts bathed by patient: Right arm, Left arm, Chest, Abdomen, Front perineal area, Buttocks, Right upper leg, Left upper leg, Right lower leg, Left lower leg (lateral lean to wash peri area) Body parts bathed by helper: Back Assist Level: 2 helpers  Function- Upper Body Dressing/Undressing What is the patient wearing?: Button up shirt Pull over shirt/dress - Perfomed by helper: Thread/unthread right sleeve, Thread/unthread left sleeve, Put head through opening, Pull shirt over trunk Button up shirt - Perfomed by patient: Thread/unthread left sleeve, Thread/unthread right sleeve Button up shirt - Perfomed by helper: Pull shirt around back, Button/unbutton shirt Function - Lower Body Dressing/Undressing What is the patient wearing?: Pants, Socks, Shoes Position:  Wheelchair/chair at sink Pants- Performed by patient: Thread/unthread left pants leg Pants- Performed by helper: Thread/unthread right pants leg, Pull pants up/down Non-skid slipper socks- Performed by helper: Don/doff right sock, Don/doff left sock Socks - Performed by helper: Don/doff right sock, Don/doff left sock Shoes - Performed by patient: Don/doff right shoe, Don/doff left shoe, Fasten right, Fasten left Assist for lower body dressing: 2 Helpers  Function - Toileting Toileting activity did not occur: Safety/medical concerns Toileting steps completed by helper: Adjust clothing prior to toileting, Performs perineal hygiene, Adjust clothing after toileting Toileting Assistive Devices: Toilet aid Assist level: Two helpers  Function - Air cabin crew transfer activity did  not occur: Safety/medical concerns  Function - Chair/bed transfer Chair/bed transfer activity did not occur: Safety/medical concerns Chair/bed transfer method: Stand pivot Chair/bed transfer assist level: Moderate assist (Pt 50 - 74%/lift or lower) Chair/bed transfer assistive device: Armrests  Function - Locomotion: Wheelchair Type: Manual Max wheelchair distance: 7f  Assist Level: Moderate assistance (Pt 50 - 74%) Assist Level: Moderate assistance (Pt 50 - 74%) Function - Locomotion: Ambulation Assistive device: Walker-rolling Max distance: 526f Assist level: 2 helpers Assist level: Moderate assist (Pt 50 - 74%) Assist level: 2 helpers  Function - Comprehension Comprehension: Auditory Comprehension assist level: Understands basic 75 - 89% of the time/ requires cueing 10 - 24% of the time  Function - Expression Expression: Verbal Expression assist level: Expresses basic 50 - 74% of the time/requires cueing 25 - 49% of the time. Needs to repeat parts of sentences.  Function - Social Interaction Social Interaction assist level: Interacts appropriately 75 - 89% of the time - Needs redirection  for appropriate language or to initiate interaction.  Function - Problem Solving Problem solving assist level: Solves basic 50 - 74% of the time/requires cueing 25 - 49% of the time  Function - Memory Memory assist level: Recognizes or recalls 50 - 74% of the time/requires cueing 25 - 49% of the time Patient normally able to recall (first 3 days only): That he or she is in a hospital  Medical Problem List and Plan: 1.  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  Cont CIR PT, OT, SLP 2.  DVT Prophylaxis/Anticoagulation: Pharmaceutical: Lovenox 3. Pain Management: Tylenol prn for knee pain 4. Mood: LCSW to follow for evaluation and support.  5. Neuropsych: This patient is not fully capable of making decisions on his own behalf. 6. Skin/Wound Care: routine pressure relief  7. Fluids/Electrolytes/Nutrition: Monitor I/O. IV fluids, repeat BMET in am 8. PAF: Monitor HR bid.  Vitals:   08/14/16 1609 08/15/16 0554  BP: 112/61 (!) 110/58  Pulse: 74 72  Resp: 18 18  Temp: 97.7 F (36.5 C) 97.6 F (36.4 C)   9. HTN: Monitor BP. Cont meds, blood pressure is coming down, B blocker may need to reduce dose,does not require  prn catapres 10. Knee OA: Meds as necessary 11. Parkinson's disease: Cont to monitor 12. Leukocytosis With reduced level of alertness: Follow CBC, afebrile . Chest x-ray findings review do not think this is source, UA positive, culture shows mixed species, now afeb, will not recult at this time, cont abx  13. AKI IVF for pre renal, not on nephrotoxic meds, IVF monitor BMET  LOS (Days) 3 A FACE TO FACE EVALUATION WAS PERFORMED  Elaine Middleton E 08/15/2016, 6:26 AM

## 2016-08-16 ENCOUNTER — Inpatient Hospital Stay (HOSPITAL_COMMUNITY): Payer: Medicare Other

## 2016-08-16 ENCOUNTER — Inpatient Hospital Stay (HOSPITAL_COMMUNITY): Payer: Medicare Other | Admitting: Physical Therapy

## 2016-08-16 ENCOUNTER — Inpatient Hospital Stay (HOSPITAL_COMMUNITY): Payer: Medicare Other | Admitting: Occupational Therapy

## 2016-08-16 ENCOUNTER — Inpatient Hospital Stay (HOSPITAL_COMMUNITY): Payer: Medicare Other | Admitting: Speech Pathology

## 2016-08-16 DIAGNOSIS — R269 Unspecified abnormalities of gait and mobility: Secondary | ICD-10-CM

## 2016-08-16 DIAGNOSIS — I69398 Other sequelae of cerebral infarction: Secondary | ICD-10-CM

## 2016-08-16 LAB — CBC WITH DIFFERENTIAL/PLATELET
BASOS ABS: 0 10*3/uL (ref 0.0–0.1)
BASOS PCT: 0 %
EOS ABS: 0.3 10*3/uL (ref 0.0–0.7)
Eosinophils Relative: 3 %
HCT: 42.5 % (ref 39.0–52.0)
HEMOGLOBIN: 14.5 g/dL (ref 13.0–17.0)
Lymphocytes Relative: 8 %
Lymphs Abs: 0.8 10*3/uL (ref 0.7–4.0)
MCH: 31.6 pg (ref 26.0–34.0)
MCHC: 34.1 g/dL (ref 30.0–36.0)
MCV: 92.6 fL (ref 78.0–100.0)
MONOS PCT: 12 %
Monocytes Absolute: 1.3 10*3/uL — ABNORMAL HIGH (ref 0.1–1.0)
NEUTROS ABS: 8.2 10*3/uL — AB (ref 1.7–7.7)
Neutrophils Relative %: 77 %
Platelets: 224 10*3/uL (ref 150–400)
RBC: 4.59 MIL/uL (ref 4.22–5.81)
RDW: 13.9 % (ref 11.5–15.5)
WBC: 10.7 10*3/uL — AB (ref 4.0–10.5)

## 2016-08-16 LAB — BASIC METABOLIC PANEL
ANION GAP: 10 (ref 5–15)
BUN: 36 mg/dL — ABNORMAL HIGH (ref 6–20)
CALCIUM: 9 mg/dL (ref 8.9–10.3)
CO2: 26 mmol/L (ref 22–32)
CREATININE: 1.58 mg/dL — AB (ref 0.61–1.24)
Chloride: 101 mmol/L (ref 101–111)
GFR calc non Af Amer: 39 mL/min — ABNORMAL LOW (ref >60)
GFR, EST AFRICAN AMERICAN: 45 mL/min — AB (ref >60)
Glucose, Bld: 105 mg/dL — ABNORMAL HIGH (ref 65–99)
Potassium: 3.2 mmol/L — ABNORMAL LOW (ref 3.5–5.1)
SODIUM: 137 mmol/L (ref 135–145)

## 2016-08-16 MED ORDER — POTASSIUM CHLORIDE CRYS ER 10 MEQ PO TBCR
10.0000 meq | EXTENDED_RELEASE_TABLET | Freq: Two times a day (BID) | ORAL | Status: DC
  Administered 2016-08-16 – 2016-08-20 (×9): 10 meq via ORAL
  Filled 2016-08-16 (×9): qty 1

## 2016-08-16 NOTE — Progress Notes (Signed)
Subjective/Complaints:  Patient in bed sleeping but awakens easily to voice.  Review of systems denies any pain with urination. No problems with his breathing. No joint pains.  Objective: Vital Signs: Blood pressure 125/65, pulse 75, temperature 98.5 F (36.9 C), temperature source Oral, resp. rate 18, height '5\' 9"'  (1.753 m), weight 82 kg (180 lb 12.4 oz), SpO2 98 %. No results found. Results for orders placed or performed during the hospital encounter of 08/12/16 (from the past 72 hour(s))  Comprehensive metabolic panel     Status: Abnormal   Collection Time: 08/13/16  5:33 AM  Result Value Ref Range   Sodium 139 135 - 145 mmol/L   Potassium 5.1 3.5 - 5.1 mmol/L   Chloride 104 101 - 111 mmol/L   CO2 24 22 - 32 mmol/L   Glucose, Bld 128 (H) 65 - 99 mg/dL   BUN 40 (H) 6 - 20 mg/dL   Creatinine, Ser 3.95 (H) 0.61 - 1.24 mg/dL   Calcium 9.3 8.9 - 10.3 mg/dL   Total Protein 6.5 6.5 - 8.1 g/dL   Albumin 3.2 (L) 3.5 - 5.0 g/dL   AST 34 15 - 41 U/L   ALT 16 (L) 17 - 63 U/L   Alkaline Phosphatase 49 38 - 126 U/L   Total Bilirubin 1.2 0.3 - 1.2 mg/dL   GFR calc non Af Amer 13 (L) >60 mL/min   GFR calc Af Amer 15 (L) >60 mL/min    Comment: (NOTE) The eGFR has been calculated using the CKD EPI equation. This calculation has not been validated in all clinical situations. eGFR's persistently <60 mL/min signify possible Chronic Kidney Disease.    Anion gap 11 5 - 15  CBC WITH DIFFERENTIAL     Status: Abnormal   Collection Time: 08/13/16  5:33 AM  Result Value Ref Range   WBC 19.9 (H) 4.0 - 10.5 K/uL   RBC 4.72 4.22 - 5.81 MIL/uL   Hemoglobin 15.2 13.0 - 17.0 g/dL   HCT 43.7 39.0 - 52.0 %   MCV 92.6 78.0 - 100.0 fL   MCH 32.2 26.0 - 34.0 pg   MCHC 34.8 30.0 - 36.0 g/dL   RDW 14.1 11.5 - 15.5 %   Platelets 180 150 - 400 K/uL   Neutrophils Relative % 85 %   Neutro Abs 16.7 (H) 1.7 - 7.7 K/uL   Lymphocytes Relative 5 %   Lymphs Abs 1.1 0.7 - 4.0 K/uL   Monocytes Relative 10 %    Monocytes Absolute 2.1 (H) 0.1 - 1.0 K/uL   Eosinophils Relative 0 %   Eosinophils Absolute 0.1 0.0 - 0.7 K/uL   Basophils Relative 0 %   Basophils Absolute 0.0 0.0 - 0.1 K/uL  Urinalysis, Routine w reflex microscopic     Status: Abnormal   Collection Time: 08/13/16  3:43 PM  Result Value Ref Range   Color, Urine YELLOW YELLOW   APPearance HAZY (A) CLEAR   Specific Gravity, Urine 1.015 1.005 - 1.030   pH 5.0 5.0 - 8.0   Glucose, UA NEGATIVE NEGATIVE mg/dL   Hgb urine dipstick LARGE (A) NEGATIVE   Bilirubin Urine NEGATIVE NEGATIVE   Ketones, ur NEGATIVE NEGATIVE mg/dL   Protein, ur 30 (A) NEGATIVE mg/dL   Nitrite NEGATIVE NEGATIVE   Leukocytes, UA MODERATE (A) NEGATIVE   RBC / HPF 6-30 0 - 5 RBC/hpf   WBC, UA TOO NUMEROUS TO COUNT 0 - 5 WBC/hpf   Bacteria, UA MANY (A) NONE SEEN  Squamous Epithelial / LPF NONE SEEN NONE SEEN  Urine culture     Status: Abnormal   Collection Time: 08/13/16  3:43 PM  Result Value Ref Range   Specimen Description URINE, CATHETERIZED    Special Requests NONE    Culture MULTIPLE SPECIES PRESENT, SUGGEST RECOLLECTION (A)    Report Status 08/14/2016 FINAL   Basic metabolic panel     Status: Abnormal   Collection Time: 08/15/16  5:54 AM  Result Value Ref Range   Sodium 136 135 - 145 mmol/L   Potassium 3.6 3.5 - 5.1 mmol/L   Chloride 104 101 - 111 mmol/L   CO2 24 22 - 32 mmol/L   Glucose, Bld 88 65 - 99 mg/dL   BUN 44 (H) 6 - 20 mg/dL   Creatinine, Ser 1.71 (H) 0.61 - 1.24 mg/dL   Calcium 8.5 (L) 8.9 - 10.3 mg/dL   GFR calc non Af Amer 35 (L) >60 mL/min   GFR calc Af Amer 41 (L) >60 mL/min    Comment: (NOTE) The eGFR has been calculated using the CKD EPI equation. This calculation has not been validated in all clinical situations. eGFR's persistently <60 mL/min signify possible Chronic Kidney Disease.    Anion gap 8 5 - 15     HEENT: Poor dentition, upper earlobe with dermatitis Cardio: RRR and No murmur Resp: CTA B/L and Unlabored GI:  BS positive and Nontender, nondistended Extremity:  No Edema Skin:   Other Dry with poor toenail care Neuro: Lethargic, Abnormal Sensory Withdraws to pinch. Bilateral upper and lower limbs, further assessment not possible due to mental status and Abnormal Motor Patient does move antigravity. 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 No pain with passive range of motion Gen. no acute distress   Assessment/Plan: 1. 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 Physiatrist is providing close team supervision and 24 hour management of active medical problems listed below. Physiatrist and rehab team continue to assess barriers to discharge/monitor patient progress toward functional and medical goals. FIM: Function - Bathing Position: Wheelchair/chair at sink Body parts bathed by patient: Front perineal area, Buttocks, Right arm, Left arm Body parts bathed by helper: Back Bathing not applicable: Chest, Abdomen, Right upper leg, Left upper leg, Right lower leg, Left lower leg Assist Level: Touching or steadying assistance(Pt > 75%)  Function- Upper Body Dressing/Undressing What is the patient wearing?: Button up shirt Pull over shirt/dress - Perfomed by helper: Thread/unthread right sleeve, Thread/unthread left sleeve, Put head through opening, Pull shirt over trunk Button up shirt - Perfomed by patient: Thread/unthread left sleeve, Thread/unthread right sleeve Button up shirt - Perfomed by helper: Thread/unthread right sleeve, Thread/unthread left sleeve, Pull shirt around back, Button/unbutton shirt Function - Lower Body Dressing/Undressing What is the patient wearing?: Pants, Socks, Shoes Position: Wheelchair/chair at [employer]-  Performed by patient: Thread/unthread right pants leg, Thread/unthread left pants leg Pants- Performed by helper: Pull pants up/down Non-skid slipper socks- Performed by helper: Don/doff right sock, Don/doff left sock Socks - Performed by helper: Don/doff right sock, Don/doff left sock (Therapist assist secondary to time.) Shoes - Performed by patient: Don/doff right shoe, Don/doff left shoe, Fasten right, Fasten left Shoes - Performed by helper: Don/doff right shoe, Don/doff left shoe, Fasten right, Fasten left (  Therapist assist secondary to decreased time.) Assist for lower body dressing: 2 Helpers  Function - Toileting Toileting activity did not occur: Safety/medical concerns Toileting steps completed by helper: Adjust clothing prior to toileting, Performs perineal hygiene, Adjust clothing after toileting Assist level: Two helpers  Function - Air cabin crew transfer activity did not occur: Safety/medical concerns Assist level to toilet: 2 helpers (per [place], [place]) Assist level from toilet: 2 helpers  Function - Chair/bed transfer Chair/bed transfer activity did not occur: Safety/medical concerns Chair/bed transfer method: Squat pivot Chair/bed transfer assist level: Moderate assist (Pt 50 - 74%/lift or lower) Chair/bed transfer assistive device: Armrests  Function - Locomotion: Wheelchair Type: Manual Max wheelchair distance: 125 Assist Level: Supervision or verbal cues Assist Level: Supervision or verbal cues Function - Locomotion: Ambulation Assistive device: Walker-rolling Max distance: 40f  Assist level: 2 helpers Assist level: Moderate assist (Pt 50 - 74%) Assist level: 2 helpers Walk 150 feet activity did not occur: Safety/medical concerns Walk 10 feet on uneven surfaces activity did not occur: Safety/medical concerns  Function - Comprehension Comprehension: Auditory Comprehension assist level: Understands basic 75 - 89% of the time/ requires cueing 10  - 24% of the time  Function - Expression Expression: Verbal Expression assist level: Expresses basic 75 - 89% of the time/requires cueing 10 - 24% of the time. Needs helper to occlude trach/needs to repeat words.  Function - Social Interaction Social Interaction assist level: Interacts appropriately 75 - 89% of the time - Needs redirection for appropriate language or to initiate interaction.  Function - Problem Solving Problem solving assist level: Solves basic 50 - 74% of the time/requires cueing 25 - 49% of the time  Function - Memory Memory assist level: Recognizes or recalls 50 - 74% of the time/requires cueing 25 - 49% of the time Patient normally able to recall (first 3 days only): That he or she is in a hospital  Medical Problem List and Plan: 1.  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  Cont CIR PT, OT, SLP 2.  DVT Prophylaxis/Anticoagulation: Pharmaceutical: Lovenox 3. Pain Management: Tylenol prn for knee pain 4. Mood: LCSW to follow for evaluation and support.  5. Neuropsych: This patient is not fully capable of making decisions on his own behalf. 6. Skin/Wound Care: routine pressure relief  7. Fluids/Electrolytes/Nutrition: Monitor I/O. IV fluids, repeat BMET as above, improving 8. PAF: Monitor HR bid.  Vitals:   08/15/16 0822 08/15/16 1500  BP: 134/62 125/65  Pulse: 78 75  Resp:  18  Temp: 98.8 F (37.1 C) 98.5 F (36.9 C)   9. HTN: Monitor BP. Cont meds, blood pressure is coming down, B blocker may need to reduce dose,does not require  prn catapres 10. Knee OA: Meds as necessary 11. Parkinson's disease: Cont to monitor 12. Leukocytosis With reduced level of alertness: Follow CBC, afebrile . Chest x-ray findings review do not think this is source, UA positive, culture shows mixed species, now afeb, will not recult at this time, cont abx     Repeat CBC  13. AKI IVF for pre renal, not on nephrotoxic meds, IVF , BUN down to 44, creatinine down to 1.71  LOS (Days) 4 A FACE TO FACE EVALUATION WAS PERFORMED  Jason Hauge E 08/16/2016, 5:18 AM

## 2016-08-16 NOTE — Progress Notes (Signed)
Physical Therapy Session Note  Patient Details  Name: [name] MRN: [id number] Date of Birth: June 06, 1931  Today's Date: 08/16/2016 PT Individual Time: 0802-0915 PT Individual Time Calculation (min): 73 min   Short Term Goals: Week 1:  PT Short Term Goal 1 (Week 1): Pt will maintain midline orientation in sitting x5 minutes with min cues PT Short Term Goal 2 (Week 1): Pt will self propel w/c x100' with min assist PT Short Term Goal 3 (Week 1): Pt will negotiate 4 steps with 1 assist   Skilled Therapeutic Interventions/Progress Updates:   Pt received supine in bed and agreeable to PT. Supine>sit transfer with min assist, heavy use of rails, and  Only min cues for sequencing and technique.   Stand pivot transfer to [facility] with mod assist from PT. Standing balance without UE suuport to manage clothing with mod assist to prevent L lateral LOB. Patient able to perform standing balance with BUE support with supervision assist for PT to perform hygiene.   Stand pivot to Bed with RW and min assist from PT and moderate cues for Ad management and gait pattern.   WC mobility x 150 to rehab gym with supervision assist from PT using BUE and BLE propulsion technique. Moderate cues from PT for improved hip flexion and knee extension on the LLE to improved effectiveness of LE propulsion. Additional WC mobility over tile and carpeted surface x 185f with supervision assist mod cues for flooring transition.   PT instructed patient in standing balance to perform minisquat with R lateral reach to pick up and stack 6 cups. Mod progressing to min assist to accept weight in the R LE and prevent pushing tendencies.   Gait training with RW x 752fwith mod assist from PT. Multiple standing rest breaks and max cues to improve midline orientation as well as cues for improved weight shifting to the R and reduced step adduction on the L to prevent pushing tendencies.   PT also instructed patient in Nustep endurance  and reciprocal pattern training x 6 minutes level 4>5. Pt rates Borg RPE 13 at end of nustep endurance training.   [facility] to [facility] and all needs met.          Therapy Documentation Precautions:  Precautions Precautions: Fall Precaution Comments: L/posterior lean worse with fatigue Restrictions Weight Bearing Restrictions: No Vital Signs: Therapy Vitals Temp: 98.3 F (36.8 C) Temp Source: Oral Pulse Rate: 78 Resp: 18 BP: 126/76 Patient Position (if appropriate): Lying Oxygen Therapy SpO2: 99 % O2 Device: Not Delivered Pain:   0/10   See Function Navigator for Current Functional Status.   Therapy/Group: Individual Therapy  AuLorie Phenix/02/2017, 7:54 AM

## 2016-08-16 NOTE — Progress Notes (Signed)
Occupational Therapy Session Note  Patient Details  Name: [name] MRN: [id number] Date of Birth: 1931/02/13  Today's Date: 08/16/2016 OT Individual Time: 1497-0263 OT Individual Time Calculation (min): 27 min    Short Term Goals: Week 1:  OT Short Term Goal 1 (Week 1): Pt will maintain dynamic sitting balance during ADL tasks with no more than min assist.  OT Short Term Goal 2 (Week 1): Pt will complete UB dressing with min assist sitting unsupported.  OT Short Term Goal 3 (Week 1): Pt will complete LB bathing with mod assist sit to stand.   OT Short Term Goal 4 (Week 1): Pt will complete LB dressing with mod assist sit to stand.   OT Short Term Goal 5 (Week 1): Pt will complete toilet transfer with RW to 3:1 with mod assist 2 consecutive trials.   Skilled Therapeutic Interventions/Progress Updates:    Pt seen in bed supine with RN present and pt asleep. Pt required multimodal cues and sternal rubs to arouse. Pt rolls L and pushes up to sit at EOB with MOD A for LE management and trunk facilitation. Pt sits EOB with MIN A-supervision for up to 4 min. Pt stand pivot transfer with MOD A and VC for initiation of LE movement and sequencing transfer. OT pushes pt w/c to dynavision at w/c level. Pt scans B with inconsistant differences in speed R v L over three 2 mintrials. Last trial pt able to select green buttons with L hand and red buttons with R hand. Pt consistently misses button when attempting to reach it and touches either to the L or below button with BUEs. Pt returned to room with wife present, all needs met and quick release belt in place.   Therapy Documentation Precautions:  Precautions Precautions: Fall Precaution Comments: L/posterior lean worse with fatigue Restrictions Weight Bearing Restrictions: No  ADL ADL Comments: See Functional Tool for details Exercises:   Other Treatments:    See Function Navigator for Current Functional Status.   Therapy/Group:  Individual Therapy  Tonny Branch 08/16/2016, 6:17 PM

## 2016-08-16 NOTE — Progress Notes (Signed)
Occupational Therapy Session Note  Patient Details  Name: [name] MRN: [id number] Date of Birth: 1931/02/15  Today's Date: 08/16/2016 OT Individual Time: 0930-1030 OT Individual Time Calculation (min): 60 min    Short Term Goals: Week 1:  OT Short Term Goal 1 (Week 1): Pt will maintain dynamic sitting balance during ADL tasks with no more than min assist.  OT Short Term Goal 2 (Week 1): Pt will complete UB dressing with min assist sitting unsupported.  OT Short Term Goal 3 (Week 1): Pt will complete LB bathing with mod assist sit to stand.   OT Short Term Goal 4 (Week 1): Pt will complete LB dressing with mod assist sit to stand.   OT Short Term Goal 5 (Week 1): Pt will complete toilet transfer with RW to 3:1 with mod assist 2 consecutive trials.   Skilled Therapeutic Interventions/Progress Updates:    Pt seen this session to focus on trunk control/balance in standing with self care training. Pt received in w/c and had just completed cathing with nursing.  He had some residual applesauce in his mouth so worked on oral care at sink. Pt removed upper dentures and brushed them and his teeth with occasional assist to cleanse denture. He reapplied them with his holding paste and powder. Pt bathed at sink with good initiation, he also worked on reaching toward his ankles with touching A for balance.  In standing at sink, L knee flexing and pt leaning to the L due to LLE weakness. Mod A to support balance in standing, but he was able to push up to stand with only min A. Pt used R hand to cleanse self.   Donned shirt with min A after set up as he was having difficulty finding opening of shirt on the bottom.  He also tried to assist with pulling his pants up using his L hand.  Good participation in therapy.  Stood several times at sink with cues for midline and to focus on pushing up through L leg.  Pt in w/c with chair alarm and quick release belt on.  Call light and phone in reach.  Therapy  Documentation Precautions:  Precautions Precautions: Fall Precaution Comments: L/posterior lean worse with fatigue Restrictions Weight Bearing Restrictions: No   Pain: Pain Assessment Pain Assessment: No/denies pain ADL: ADL ADL Comments: See Functional Tool for details  See Function Navigator for Current Functional Status.   Therapy/Group: Individual Therapy  SAGUIER,JULIA 08/16/2016, 12:39 PM

## 2016-08-16 NOTE — Progress Notes (Signed)
Speech Language Pathology Daily Session Note  Patient Details  Name: [name] MRN: [id number] Date of Birth: 06-14-1930  Today's Date: 08/16/2016 SLP Individual Time: 1100-1130 SLP Individual Time Calculation (min): 30 min  Short Term Goals: Week 1: SLP Short Term Goal 1 (Week 1): Patient will solve basic problems realted to self-care with Mod assist  SLP Short Term Goal 2 (Week 1): Patient will verbally identify 2 physical and 2 cognitive changes wtih Mod question cues  SLP Short Term Goal 3 (Week 1): Patient will consume Dys.3 textures and thin liquids with Mod assist verbal question cues to recall and utilize safe swallow strategeis to manage oral residue and prevent overt s/s of aspiration.   SLP Short Term Goal 4 (Week 1): Patient will sustain attention to familiar tasks for 10-20 minutes with Mod assist verbal cues for redirection SLP Short Term Goal 5 (Week 1): Patient will utilize speech intelligibilty strategies at the phrase-sentence level of verbal expression with Mod assist verbal cues   Skilled Therapeutic Interventions: Skilled treatment session focused on cognition goals. Pt appeared more fatigued this session than previous sessions and as a result, pt's intelligibility was decreased and pt was not stimulable for increasing intelligibility. Pt required Max A to Mod A verbal cues for basic problem solving during checkers game. Pt with significant inattention to right side during functional task. Pt placed checker on left of line instead of inside box. [name] present and states that pt is largely inattentive to right side during tasks. Pt was taken back to room, left upright in wheelchair, safety belt donned and all needs within reach. Wife and son present. Continue per current plan of care.      Function:   Cognition Comprehension Comprehension assist level: Understands basic 75 - 89% of the time/ requires cueing 10 - 24% of the time  Expression   Expression assist  level: Expresses basic 50 - 74% of the time/requires cueing 25 - 49% of the time. Needs to repeat parts of sentences.  Social Interaction Social Interaction assist level: Interacts appropriately 75 - 89% of the time - Needs redirection for appropriate language or to initiate interaction.  Problem Solving Problem solving assist level: Solves basic 50 - 74% of the time/requires cueing 25 - 49% of the time  Memory Memory assist level: Recognizes or recalls 50 - 74% of the time/requires cueing 25 - 49% of the time    Pain    Therapy/Group: Individual Therapy   [name], M.S., CCC-SLP Speech-Language Pathologist   [name] 08/16/2016, 11:51 AM

## 2016-08-17 ENCOUNTER — Inpatient Hospital Stay (HOSPITAL_COMMUNITY): Payer: Medicare Other | Admitting: Occupational Therapy

## 2016-08-17 ENCOUNTER — Inpatient Hospital Stay (HOSPITAL_COMMUNITY): Payer: Medicare Other

## 2016-08-17 DIAGNOSIS — R7989 Other specified abnormal findings of blood chemistry: Secondary | ICD-10-CM

## 2016-08-17 DIAGNOSIS — E876 Hypokalemia: Secondary | ICD-10-CM

## 2016-08-17 MED ORDER — SODIUM CHLORIDE 0.45 % IV SOLN
INTRAVENOUS | Status: AC
  Administered 2016-08-17: 22:00:00 via INTRAVENOUS

## 2016-08-17 MED ORDER — BISOPROLOL FUMARATE 5 MG PO TABS
5.0000 mg | ORAL_TABLET | Freq: Every day | ORAL | Status: DC
Start: 2016-08-18 — End: 2016-08-21
  Administered 2016-08-18 – 2016-08-20 (×3): 5 mg via ORAL
  Filled 2016-08-17 (×4): qty 1

## 2016-08-17 NOTE — Progress Notes (Signed)
Occupational Therapy Session Note  Patient Details  Name: [name] MRN: [id number] Date of Birth: [birth year]  Today's Date: 08/17/2016 OT Individual Time: [id number] OT Individual Time Calculation (min): 35 min    Short Term Goals: Week 1:  OT Short Term Goal 1 (Week 1): Pt will maintain dynamic sitting balance during ADL tasks with no more than min assist.  OT Short Term Goal 2 (Week 1): Pt will complete UB dressing with min assist sitting unsupported.  OT Short Term Goal 3 (Week 1): Pt will complete LB bathing with mod assist sit to stand.   OT Short Term Goal 4 (Week 1): Pt will complete LB dressing with mod assist sit to stand.   OT Short Term Goal 5 (Week 1): Pt will complete toilet transfer with RW to 3:1 with mod assist 2 consecutive trials.   Skilled Therapeutic Interventions/Progress Updates: Patient scheduled short OT session this am and preferred to wash and dress LB.   Patient supine with head of bed elevated to EOB = Mod  A;        EOB to w/c squat pivot= Mod A,    LB bathing sit to stand at sink  = max A;   LB dressing sit to stand= max A  Patient was left seated in w/c bedside with w/c belt, call bell and phone in place - at session's end     Therapy Documentation Precautions:  Precautions Precautions: Fall Precaution Comments: L/posterior lean worse with fatigue Restrictions Weight Bearing Restrictions: No       Pain: Pain Assessment Pain Assessment: No/denies pain  See Function Navigator for Current Functional Status.   Therapy/Group: Individual Therapy  Bud Faceickett, Zhanae Proffit [facility] 08/17/2016, 8:58 PM

## 2016-08-17 NOTE — Progress Notes (Signed)
Occupational Therapy Session Note  Patient Details  Name: [name] MRN: [id number] Date of Birth: October 06, 1930  Today's Date: 08/17/2016 OT Individual Time: 0930-1100 OT Individual Time Calculation (min): 90 min    Short Term Goals: Week 1:  OT Short Term Goal 1 (Week 1): Pt will maintain dynamic sitting balance during ADL tasks with no more than min assist.  OT Short Term Goal 2 (Week 1): Pt will complete UB dressing with min assist sitting unsupported.  OT Short Term Goal 3 (Week 1): Pt will complete LB bathing with mod assist sit to stand.   OT Short Term Goal 4 (Week 1): Pt will complete LB dressing with mod assist sit to stand.   OT Short Term Goal 5 (Week 1): Pt will complete toilet transfer with RW to 3:1 with mod assist 2 consecutive trials.   Skilled Therapeutic Interventions/Progress Updates:    Pt seen seated in w/c upon arrival reporting no pain and agreeable to tx. Pt stand pivot transfer from w/c>TTB in shower  Using grab bar and arm rest to stand with MIN A for balance and VC for LE management. Pt bathes 10/10 body parts with touching assistance for standing and VC for posture. Pt demo posterior pelvic tilt and kyphotic posture. Pt able to maintain midline sitting during entire duration of shower. In shower pt has bowel movement and [facility] Pt completes posterior hygiene. Pt ambulates to EOB with walker and MIN A for balance and VC to shift weight onto R leg as pt demo L leaning while walking. Pt dons pull over shirt with Vc to orient shirt before donning. Pt threads BLE into pants/underwear with touching assistance and Vc for posture and weight shifting as pt advances pants past hips. With Vc to assume seated figure 4, pt dons B socks and shoes. While sitting EOB, pt demo lateral leaning to L with tactile/verbal cues to correct midline orientation. In w/c pt manages containers, peels hard boiled egg and cuts up fruit to eat for breakfast. Pt able to hold  utensil, but unable to cut up pancake due to toughness. OT educated wife/son on swallowing strategies per SLP handout and and wife/OT provide VC to alternate sips and swallows. Exited session with wife and son in room and RN aware pt is finishing breakfast.   Therapy Documentation Precautions:  Precautions Precautions: Fall Precaution Comments: L/posterior lean worse with fatigue Restrictions Weight Bearing Restrictions: No    Other Treatments:    See Function Navigator for Current Functional Status.   Therapy/Group: Individual Therapy  Shon HaleStephanie M Nechelle Petrizzo 08/17/2016, 1:03 PM

## 2016-08-17 NOTE — Progress Notes (Signed)
Occupational Therapy Session Note  Patient Details  Name: [name] MRN: [id number] Date of Birth: 09/19/1930  Today's Date: 08/17/2016 OT Individual Time: [id number] OT Individual Time Calculation (min): 72 min    Short Term Goals: Week 1:  OT Short Term Goal 1 (Week 1): Pt will maintain dynamic sitting balance during ADL tasks with no more than min assist.  OT Short Term Goal 2 (Week 1): Pt will complete UB dressing with min assist sitting unsupported.  OT Short Term Goal 3 (Week 1): Pt will complete LB bathing with mod assist sit to stand.   OT Short Term Goal 4 (Week 1): Pt will complete LB dressing with mod assist sit to stand.   OT Short Term Goal 5 (Week 1): Pt will complete toilet transfer with RW to 3:1 with mod assist 2 consecutive trials.   Skilled Therapeutic Interventions/Progress Updates: Pt was lying in bed drinking ensure with spouse present at time of arrival. He as agreeable to session. Tx focus on ADL retraining, L UE NMR, visual scanning, and dynamic sitting balance. Supine<sit and functional stand pivot transfer to w/c completed with Mod A with manual faciliation for weight shifting. Pt exhibits strong posterior lean due to fear of falling (per pt report). Pt self propelled to dayroom with emphasis on L UE strengthening/coordination. Therapist stood on left to protect L UE/raise awareness to left side. Pt encouraged to visually scan in hallway to avoid environmental barriers. He required mod cues for this. Once in dayroom, pt completed another stand pivot transfer to mat. Had him engage in simulate dressing tasks EOM. Pt donned/doffed paper shirt with close supervision and 2 small posterior LOBs. Pt able to self correct with extra time. Had him turn shirt right side out/fold shirt for orientation and Lt FM skills. He required min questioning cues for correctly orienting his clothing items. Initially practiced LB dressing with large paper pants but elastic waist was too  challenging for pt to thread his legs through. Therefore practice continued with yellow theraband. With extra time, pt able to complete 3/3 components of LB dressing with hemi technique. Multimodal cues provided for anterior weight shift in standing without device. He was able to don both shoes with supervision afterwards while sitting. He then transferred back to w/c in manner as written above. He self propelled to doors leading out of dayroom and was escorted the rest of the way back to room by OT due to fatigue. He was left with safety belt and family present at time of departure.      Therapy Documentation Precautions:  Precautions Precautions: Fall Precaution Comments: L/posterior lean worse with fatigue Restrictions Weight Bearing Restrictions: No General:   Vital Signs: Therapy Vitals Temp: 97.4 F (36.3 C) Temp Source: Oral Pulse Rate: 72 Resp: 19 BP: 122/60 Patient Position (if appropriate): Lying Oxygen Therapy SpO2: 98 % O2 Device: Not Delivered Pain: No c/o pain during session    ADL: ADL ADL Comments: See Functional Tool for details    See Function Navigator for Current Functional Status.   Therapy/Group: Individual Therapy  Medardo Hassing A Mateusz Neilan 08/17/2016, 5:35 PM

## 2016-08-17 NOTE — Progress Notes (Addendum)
Subjective/Complaints:  Patient feels okay today. Wife and son are visiting. Wife is trying to increase his intake of meals  Review of systems denies any pain with urination. No problems with his breathing. No joint pains.  Objective: Vital Signs: Blood pressure (!) 93/38, pulse 71, temperature 99.1 F (37.3 C), temperature source Oral, resp. rate 19, height '5\' 9"'  (1.753 m), weight 82 kg (180 lb 12.4 oz), SpO2 97 %. No results found. Results for orders placed or performed during the hospital encounter of 08/12/16 (from the past 72 hour(s))  Basic metabolic panel     Status: Abnormal   Collection Time: 08/15/16  5:54 AM  Result Value Ref Range   Sodium 136 135 - 145 mmol/L   Potassium 3.6 3.5 - 5.1 mmol/L   Chloride 104 101 - 111 mmol/L   CO2 24 22 - 32 mmol/L   Glucose, Bld 88 65 - 99 mg/dL   BUN 44 (H) 6 - 20 mg/dL   Creatinine, Ser 1.71 (H) 0.61 - 1.24 mg/dL   Calcium 8.5 (L) 8.9 - 10.3 mg/dL   GFR calc non Af Amer 35 (L) >60 mL/min   GFR calc Af Amer 41 (L) >60 mL/min    Comment: (NOTE) The eGFR has been calculated using the CKD EPI equation. This calculation has not been validated in all clinical situations. eGFR's persistently <60 mL/min signify possible Chronic Kidney Disease.    Anion gap 8 5 - 15  CBC with Differential/Platelet     Status: Abnormal   Collection Time: 08/16/16 10:25 AM  Result Value Ref Range   WBC 10.7 (H) 4.0 - 10.5 K/uL   RBC 4.59 4.22 - 5.81 MIL/uL   Hemoglobin 14.5 13.0 - 17.0 g/dL   HCT 42.5 39.0 - 52.0 %   MCV 92.6 78.0 - 100.0 fL   MCH 31.6 26.0 - 34.0 pg   MCHC 34.1 30.0 - 36.0 g/dL   RDW 13.9 11.5 - 15.5 %   Platelets 224 150 - 400 K/uL   Neutrophils Relative % 77 %   Neutro Abs 8.2 (H) 1.7 - 7.7 K/uL   Lymphocytes Relative 8 %   Lymphs Abs 0.8 0.7 - 4.0 K/uL   Monocytes Relative 12 %   Monocytes Absolute 1.3 (H) 0.1 - 1.0 K/uL   Eosinophils Relative 3 %   Eosinophils Absolute 0.3 0.0 - 0.7 K/uL   Basophils Relative 0 %   Basophils Absolute 0.0 0.0 - 0.1 K/uL  Basic metabolic panel     Status: Abnormal   Collection Time: 08/16/16 10:25 AM  Result Value Ref Range   Sodium 137 135 - 145 mmol/L   Potassium 3.2 (L) 3.5 - 5.1 mmol/L   Chloride 101 101 - 111 mmol/L   CO2 26 22 - 32 mmol/L   Glucose, Bld 105 (H) 65 - 99 mg/dL   BUN 36 (H) 6 - 20 mg/dL   Creatinine, Ser 1.58 (H) 0.61 - 1.24 mg/dL   Calcium 9.0 8.9 - 10.3 mg/dL   GFR calc non Af Amer 39 (L) >60 mL/min   GFR calc Af Amer 45 (L) >60 mL/min    Comment: (NOTE) The eGFR has been calculated using the CKD EPI equation. This calculation has not been validated in all clinical situations. eGFR's persistently <60 mL/min signify possible Chronic Kidney Disease.    Anion gap 10 5 - 15     HEENT: Poor dentition, upper earlobe with dermatitis Cardio: RRR and No murmur Resp: CTA B/L and Unlabored  GI: BS positive and Nontender, nondistended Extremity:  No Edema Skin:   Other Dry with poor toenail care Neuro: Lethargic, Abnormal Sensory Withdraws to pinch. Bilateral upper and lower limbs, further assessment not possible due to mental status and Abnormal Motor Patient does move antigravity. 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 No pain with passive range of motion Gen. no acute distress   Assessment/Plan: 1. 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 Physiatrist is providing close team supervision and 24 hour management of active medical problems listed below. Physiatrist and rehab team continue to assess barriers to discharge/monitor patient progress toward functional and medical goals. FIM: Function - Bathing Position: Wheelchair/chair at sink Body parts bathed by patient: Front  perineal area, Buttocks, Right arm, Left arm, Chest, Abdomen, Right upper leg, Left upper leg Body parts bathed by helper: Right lower leg, Left lower leg, Back Bathing not applicable: Chest, Abdomen, Right upper leg, Left upper leg, Right lower leg, Left lower leg Assist Level: Touching or steadying assistance(Pt > 75%)  Function- Upper Body Dressing/Undressing What is the patient wearing?: Pull over shirt/dress Pull over shirt/dress - Perfomed by patient: Thread/unthread right sleeve, Thread/unthread left sleeve, Put head through opening Pull over shirt/dress - Perfomed by helper: Pull shirt over trunk Button up shirt - Perfomed by patient: Thread/unthread left sleeve, Thread/unthread right sleeve Button up shirt - Perfomed by helper: Thread/unthread right sleeve, Thread/unthread left sleeve, Pull shirt around back, Button/unbutton shirt Function - Lower Body Dressing/Undressing What is the patient wearing?: Pants, Socks, Shoes Position: Wheelchair/chair at sink Pants- Performed by patient: Thread/unthread right pants leg, Thread/unthread left pants leg Pants- Performed by helper: Pull pants up/down Non-skid slipper socks- Performed by helper: Don/doff right sock, Don/doff left sock Socks - Performed by helper: Don/doff right sock, Don/doff left sock (Therapist assist secondary to time.) Shoes - Performed by patient: Don/doff right shoe, Don/doff left shoe, Fasten right, Fasten left Shoes - Performed by helper: Don/doff right shoe, Don/doff left shoe, Fasten right, Fasten left Assist for lower body dressing: 2 Helpers  Function - Toileting Toileting activity did not occur: Safety/medical concerns Toileting steps completed by helper: Adjust clothing prior to toileting, Performs perineal hygiene, Adjust clothing after toileting Assist level: Two helpers  Function - Air cabin crew transfer activity did not occur: Safety/medical concerns Assist level to toilet: 2 helpers (per  [place] In[place], NT) Assist level from toilet: 2 helpers  Function - Chair/bed transfer Chair/bed transfer activity did not occur: Safety/medical concerns Chair/bed transfer method: Stand pivot Chair/bed transfer assist level: Moderate assist (Pt 50 - 74%/lift or lower) Chair/bed transfer assistive device: Walker  Function - Locomotion: Wheelchair Type: Manual Max wheelchair distance: 164f  Assist Level: Supervision or verbal cues Assist Level: Supervision or verbal cues Assist Level: Supervision or verbal cues Turns around,maneuvers to table,bed, and toilet,negotiates 3% grade,maneuvers on rugs and over doorsills: No Function - Locomotion: Ambulation Assistive device: Walker-rolling Max distance: 722fAssist level: Moderate assist (Pt 50 - 74%) Assist level: Moderate assist (Pt 50 - 74%) Assist level: 2 helpers Walk 150 feet activity did not occur: Safety/medical concerns Walk 10 feet on uneven surfaces activity did not occur: Safety/medical concerns  Function - Comprehension Comprehension: Auditory Comprehension assist level: Understands basic 75 - 89% of the time/ requires  cueing 10 - 24% of the time  Function - Expression Expression: Verbal Expression assist level: Expresses basic 75 - 89% of the time/requires cueing 10 - 24% of the time. Needs helper to occlude trach/needs to repeat words.  Function - Social Interaction Social Interaction assist level: Interacts appropriately 75 - 89% of the time - Needs redirection for appropriate language or to initiate interaction.  Function - Problem Solving Problem solving assist level: Solves basic 50 - 74% of the time/requires cueing 25 - 49% of the time  Function - Memory Memory assist level: Recognizes or recalls 50 - 74% of the time/requires cueing 25 - 49% of the time Patient normally able to recall (first 3 days only): That he or she is in a hospital  Medical Problem List and Plan: 1.  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  Cont CIR PT, OT, SLP 2.  DVT Prophylaxis/Anticoagulation: Pharmaceutical: Lovenox 3. Pain Management: Tylenol prn for knee pain 4. Mood: LCSW to follow for evaluation and support.  5. Neuropsych: This patient is not fully capable of making decisions on his own behalf. 6. Skin/Wound Care: routine pressure relief  7. Fluids/Electrolytes/Nutrition: Monitor I/O. IV fluids, repeat BMET as above, improving 8. PAF: Monitor HR bid.  Vitals:   08/16/16 1515 08/17/16 0526  BP: 135/76 (!) 93/38  Pulse: 68 71  Resp: 18 19  Temp: 97.8 F (36.6 C) 99.1 F (37.3 C)   9. HTN: Monitor BP. Cont meds, blood pressure is coming down, Bisoprolol need to reduce doseTo 5 mg ,does not require  prn catapres 10. Knee OA: Meds as necessary 11. Parkinson's disease: Cont to monitor 12. Leukocytosis, UA positive, culture shows mixed species, now afeb, will not recult at this time, cont abx     Repeat CBC   Shows near normalization of WBCs                                                                             13. AKI IVF for pre renal, not on nephrotoxic meds, IVF ,  13. Hypokalemia, supplement orally, likely secondary to IV fluids LOS (Days) 5 A FACE TO FACE EVALUATION WAS PERFORMED  KIRSTEINS,ANDREW E 08/17/2016, 10:55 AM

## 2016-08-18 MED ORDER — SODIUM CHLORIDE 0.45 % IV SOLN
INTRAVENOUS | Status: AC
  Administered 2016-08-18: 23:00:00 via INTRAVENOUS

## 2016-08-18 NOTE — Progress Notes (Signed)
Subjective/Complaints:  Patient without pain complaints. No shortness of breath. IV reduced to 12 hour  Review of systems denies any pain with urination. No problems with his breathing. No joint pains.  Objective: Vital Signs: Blood pressure 132/70, pulse 73, temperature 98.3 F (36.8 C), temperature source Oral, resp. rate 16, height 5' 9" (1.753 m), weight 82 kg (180 lb 12.4 oz), SpO2 92 %. No results found. Results for orders placed or performed during the hospital encounter of 08/12/16 (from the past 72 hour(s))  CBC with Differential/Platelet     Status: Abnormal   Collection Time: 08/16/16 10:25 AM  Result Value Ref Range   WBC 10.7 (H) 4.0 - 10.5 K/uL   RBC 4.59 4.22 - 5.81 MIL/uL   Hemoglobin 14.5 13.0 - 17.0 g/dL   HCT 42.5 39.0 - 52.0 %   MCV 92.6 78.0 - 100.0 fL   MCH 31.6 26.0 - 34.0 pg   MCHC 34.1 30.0 - 36.0 g/dL   RDW 13.9 11.5 - 15.5 %   Platelets 224 150 - 400 K/uL   Neutrophils Relative % 77 %   Neutro Abs 8.2 (H) 1.7 - 7.7 K/uL   Lymphocytes Relative 8 %   Lymphs Abs 0.8 0.7 - 4.0 K/uL   Monocytes Relative 12 %   Monocytes Absolute 1.3 (H) 0.1 - 1.0 K/uL   Eosinophils Relative 3 %   Eosinophils Absolute 0.3 0.0 - 0.7 K/uL   Basophils Relative 0 %   Basophils Absolute 0.0 0.0 - 0.1 K/uL  Basic metabolic panel     Status: Abnormal   Collection Time: 08/16/16 10:25 AM  Result Value Ref Range   Sodium 137 135 - 145 mmol/L   Potassium 3.2 (L) 3.5 - 5.1 mmol/L   Chloride 101 101 - 111 mmol/L   CO2 26 22 - 32 mmol/L   Glucose, Bld 105 (H) 65 - 99 mg/dL   BUN 36 (H) 6 - 20 mg/dL   Creatinine, Ser 1.58 (H) 0.61 - 1.24 mg/dL   Calcium 9.0 8.9 - 10.3 mg/dL   GFR calc non Af Amer 39 (L) >60 mL/min   GFR calc Af Amer 45 (L) >60 mL/min    Comment: (NOTE) The eGFR has been calculated using the CKD EPI equation. This calculation has not been validated in all clinical situations. eGFR's persistently <60 mL/min signify possible Chronic Kidney Disease.    Anion gap 10 5 - 15     HEENT: Poor dentition, upper earlobe with dermatitis Cardio: RRR and No murmur Resp: CTA B/L and Unlabored GI: BS positive and Nontender, nondistended Extremity:  No Edema Skin:   Other Dry with poor toenail care Neuro: Lethargic, Abnormal Sensory Withdraws to pinch. Bilateral upper and lower limbs, further assessment not possible due to mental status and Abnormal Motor Patient does move antigravity. 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 No pain with passive range of motion Gen. no acute distress   Assessment/Plan: 1. 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 Physiatrist is providing close team supervision and 24 hour management of active medical problems listed below. Physiatrist and rehab team continue to assess barriers to discharge/monitor patient progress toward functional and medical goals. FIM: Function - Bathing Position: Shower Body parts bathed by patient: Right arm,  Left arm, Chest, Abdomen, Front perineal area, Buttocks, Right upper leg, Left upper leg, Right lower leg, Left lower leg Body parts bathed by helper: Back Bathing not applicable: Chest, Abdomen, Right upper leg, Left upper leg, Right lower leg, Left lower leg Assist Level: Touching or steadying assistance(Pt > 75%)  Function- Upper Body Dressing/Undressing What is the patient wearing?: Pull over shirt/dress Pull over shirt/dress - Perfomed by patient: Thread/unthread right sleeve, Thread/unthread left sleeve, Put head through opening Pull over shirt/dress - Perfomed by helper: Pull shirt over trunk Button up shirt - Perfomed by patient: Thread/unthread left sleeve, Thread/unthread right sleeve Button up shirt -  Perfomed by helper: Thread/unthread right sleeve, Thread/unthread left sleeve, Pull shirt around back, Button/unbutton shirt Assist Level: Supervision or verbal cues Function - Lower Body Dressing/Undressing What is the patient wearing?: Pants, Socks, Shoes, Underwear Position: Sitting EOB Underwear - Performed by patient: Thread/unthread right underwear leg, Thread/unthread left underwear leg, Pull underwear up/down Pants- Performed by patient: Thread/unthread right pants leg, Thread/unthread left pants leg, Pull pants up/down Pants- Performed by helper: Pull pants up/down Non-skid slipper socks- Performed by helper: Don/doff right sock, Don/doff left sock Socks - Performed by patient: Don/doff right sock, Don/doff left sock Socks - Performed by helper: Don/doff right sock, Don/doff left sock (Therapist assist secondary to time.) Shoes - Performed by patient: Don/doff right shoe, Don/doff left shoe, Fasten right, Fasten left Shoes - Performed by helper: Don/doff right shoe, Don/doff left shoe, Fasten right, Fasten left Assist for footwear: Supervision/touching assist Assist for lower body dressing: Touching or steadying assistance (Pt > 75%)  Function - Toileting Toileting activity did not occur: Safety/medical concerns Toileting steps completed by patient: Performs perineal hygiene Toileting steps completed by helper: Adjust clothing prior to toileting, Performs perineal hygiene, Adjust clothing after toileting Toileting Assistive Devices: Grab bar or rail Assist level: Touching or steadying assistance (Pt.75%)  Function - Air cabin crew transfer activity did not occur: Safety/medical concerns Assist level to toilet: 2 helpers (per [place] Ingram, NT) Assist level from toilet: 2 helpers  Function - Chair/bed transfer Chair/bed transfer activity did not occur: Safety/medical concerns Chair/bed transfer method: Stand pivot Chair/bed transfer assist level: Moderate assist (Pt 50  - 74%/lift or lower) Chair/bed transfer assistive device: Armrests Chair/bed transfer details: Verbal cues for sequencing, Verbal cues for technique, Verbal cues for precautions/safety, Verbal cues for safe use of DME/AE, Tactile cues for placement, Tactile cues for weight shifting, Tactile cues for posture  Function - Locomotion: Wheelchair Type: Manual Max wheelchair distance: 179f  Assist Level: Supervision or verbal cues Assist Level: Supervision or verbal cues Assist Level: Supervision or verbal cues Turns around,maneuvers to table,bed, and toilet,negotiates 3% grade,maneuvers on rugs and over doorsills: No Function - Locomotion: Ambulation Assistive device: Walker-rolling Max distance: 732fAssist level: Moderate assist (Pt 50 - 74%) Assist level: Moderate assist (Pt 50 - 74%) Assist level: 2 helpers Walk 150 feet activity did not occur: Safety/medical concerns Walk 10 feet on uneven surfaces activity did not occur: Safety/medical concerns  Function - Comprehension Comprehension: Auditory Comprehension assist level: Understands basic 75 - 89% of the time/ requires cueing 10 - 24% of the time  Function - Expression Expression: Verbal Expression assist level: Expresses basic 75 - 89% of the time/requires cueing 10 - 24% of the time. Needs helper to occlude trach/needs to repeat words.  Function - Social Interaction Social Interaction assist level: Interacts appropriately 75 - 89% of the time - Needs redirection for appropriate language or to initiate  interaction.  Function - Problem Solving Problem solving assist level: Solves basic 50 - 74% of the time/requires cueing 25 - 49% of the time  Function - Memory Memory assist level: Recognizes or recalls 50 - 74% of the time/requires cueing 25 - 49% of the time Patient normally able to recall (first 3 days only): That he or she is in a hospital  Medical Problem List and Plan: 1.  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  Cont CIR PT, OT, SLP 2.  DVT Prophylaxis/Anticoagulation: Pharmaceutical: Lovenox 3. Pain Management: Tylenol prn for knee pain 4. Mood: LCSW to follow for evaluation and support.  5. Neuropsych: This patient is not fully capable of making decisions on his own behalf. 6. Skin/Wound Care: routine pressure relief  7. Fluids/Electrolytes/Nutrition: Monitor I/O. IV fluids, repeat BMET and hopefully d/c IVF 8. PAF: Monitor HR bid.  Vitals:   08/18/16 0500 08/18/16 0951  BP: (!) 150/76 132/70  Pulse: 73   Resp: 16   Temp: 98.3 F (36.8 C)    9. HTN: Monitor BP. Cont meds, blood pressure is coming down, Bisoprolol need to reduce doseTo 5 mg ,does not require  prn catapres, monitor 10. Knee OA: Meds as necessary 11. Parkinson's disease: Cont to monitor 12. Leukocytosis, UA positive, culture shows mixed species, now afeb, will not recult at this time, cont abx     Repeat CBC   Shows near normalization of WBCs                                                                             13. AKI IVF for pre renal, not on nephrotoxic meds, IVF ,  13. Hypokalemia, supplement orally, likely secondary to IV fluids, repeat BMET after supplementation LOS (Days) 6 A FACE TO FACE EVALUATION WAS PERFORMED  KIRSTEINS,ANDREW E 08/18/2016, 9:52 AM

## 2016-08-19 ENCOUNTER — Inpatient Hospital Stay (HOSPITAL_COMMUNITY): Payer: Medicare Other | Admitting: Speech Pathology

## 2016-08-19 ENCOUNTER — Inpatient Hospital Stay (HOSPITAL_COMMUNITY): Payer: Medicare Other | Admitting: Occupational Therapy

## 2016-08-19 ENCOUNTER — Inpatient Hospital Stay (HOSPITAL_COMMUNITY): Payer: Medicare Other | Admitting: *Deleted

## 2016-08-19 LAB — BASIC METABOLIC PANEL
Anion gap: 7 (ref 5–15)
BUN: 18 mg/dL (ref 6–20)
CALCIUM: 8.4 mg/dL — AB (ref 8.9–10.3)
CO2: 23 mmol/L (ref 22–32)
CREATININE: 1.25 mg/dL — AB (ref 0.61–1.24)
Chloride: 105 mmol/L (ref 101–111)
GFR, EST AFRICAN AMERICAN: 60 mL/min — AB (ref >60)
GFR, EST NON AFRICAN AMERICAN: 51 mL/min — AB (ref >60)
Glucose, Bld: 93 mg/dL (ref 65–99)
Potassium: 4 mmol/L (ref 3.5–5.1)
SODIUM: 135 mmol/L (ref 135–145)

## 2016-08-19 LAB — CBC
HCT: 38.2 % — ABNORMAL LOW (ref 39.0–52.0)
Hemoglobin: 13.1 g/dL (ref 13.0–17.0)
MCH: 31.3 pg (ref 26.0–34.0)
MCHC: 34.3 g/dL (ref 30.0–36.0)
MCV: 91.2 fL (ref 78.0–100.0)
Platelets: 235 10*3/uL (ref 150–400)
RBC: 4.19 MIL/uL — ABNORMAL LOW (ref 4.22–5.81)
RDW: 13.6 % (ref 11.5–15.5)
WBC: 13 10*3/uL — AB (ref 4.0–10.5)

## 2016-08-19 MED ORDER — ENOXAPARIN SODIUM 40 MG/0.4ML ~~LOC~~ SOLN
40.0000 mg | SUBCUTANEOUS | Status: DC
  Administered 2016-08-19 – 2016-09-02 (×15): 40 mg via SUBCUTANEOUS
  Filled 2016-08-19 (×15): qty 0.4

## 2016-08-19 NOTE — Progress Notes (Signed)
Occupational Therapy Session Note  Patient Details  Name: [name] MRN: [id number] Date of Birth: 1930-08-07  Today's Date: 08/19/2016 OT Individual Time: 1005-1102 OT Individual Time Calculation (min): 57 min    Short Term Goals: Week 1:  OT Short Term Goal 1 (Week 1): Pt will maintain dynamic sitting balance during ADL tasks with no more than min assist.  OT Short Term Goal 2 (Week 1): Pt will complete UB dressing with min assist sitting unsupported.  OT Short Term Goal 3 (Week 1): Pt will complete LB bathing with mod assist sit to stand.   OT Short Term Goal 4 (Week 1): Pt will complete LB dressing with mod assist sit to stand.   OT Short Term Goal 5 (Week 1): Pt will complete toilet transfer with RW to 3:1 with mod assist 2 consecutive trials.   Skilled Therapeutic Interventions/Progress Updates:    Pt completed transfer to the toilet with mod assist, ambulating without assistive device.  Increased LOB to the right during mobility to the toilet.  He was able to manage clothing before sitting down.  Toilet hygiene completed with min guard assist in sitting.  Therapist helped remove clothing from LEs in order to transfer from toilet to the shower.  Mod assist for transfer to the walk-in shower.  He completed bathing sit to stand with min assist.  Max demonstrational cueing for midline orientation as he would drift to the left while bathing.  Min instructional cueing for sequencing bathing as well.  Once dry, pt ambulated out to the wheelchair in front of the sink for dressing tasks.  He needed mod assist to start the shirt over the LUE and for pulling it down in the back.  Mod assist also needed for threading brief and for pulling over hips in standing.  Therapist assisted with pull up pants, socks, and shoes secondary to decreased time.  [name] was able to complete combing his hair at end of session with setup of the comb.  He was left in wheelchair at bedside with call button in  reach.     Therapy Documentation Precautions:  Precautions Precautions: Fall Precaution Comments: L/posterior lean worse with fatigue Restrictions Weight Bearing Restrictions: No  Pain: Pain Assessment Pain Assessment: No/denies pain ADL:  See Function Navigator for Current Functional Status.   Therapy/Group: Individual Therapy  Pria Klosinski OTR/L 08/19/2016, 12:42 PM

## 2016-08-19 NOTE — Progress Notes (Signed)
Physical Therapy Session Note  Patient Details  Name: [name] MRN: [id number] Date of Birth: [birth year]  Today's Date: 08/19/2016 PT Individual Time: 1345-1500 PT Individual Time Calculation (min): 75 min   Short Term Goals: Week 1:  PT Short Term Goal 1 (Week 1): Pt will maintain midline orientation in sitting x5 minutes with min cues PT Short Term Goal 2 (Week 1): Pt will self propel w/c x100' with min assist PT Short Term Goal 3 (Week 1): Pt will negotiate 4 steps with 1 assist  Week 2:     Skilled Therapeutic Interventions/Progress Updates:    Tx focused on functional transfer training, gait with RW and hall rail, and NMR via forced use, manual facilitation, and verbal cues. [facility] Pt reports occasional diplopia with fatigue.  Stand-pivot transfer with Min/mod A to WC<>Nustep<>mat. Nustep x3310min with 4 extemities and level 4>>5 for increased reciprocal stepping at 60 spm.   NMR in sitting and standing for midline orientation and R-sided attention tasks. Pt had most difficulty in standing due to strong L lean, but visual feedback helped significantly with mirror. A variety of transitional movements, trunk control, and perturbations included the following with up to max A to recover due to L lean, posterior LOB, delayed and insufficient righting reactions:  - serial sit<>stands x10 with 2" step under LLE - lateral weight shifts - standing horseshow toss - seated leans to<>from R elbow with head turns - object tracking and location in R visual field - static standing>trunk rotations>UE reaching  Instructed pt in tracking and gaze stabilization techniques, which were challenging. Discussed with OT who reported nerve palsy limiting, but unable to locate that info in the chart. Please continue to follow-up on vision as pt relies on this heavily with balance.   Gait training with RW 2x50' with Max A for midline orientation due to strong L lean, pt unable  to correct with multi-modal cues.  GAit with R hall rail 2x30' with Min A and cues for leaning into the wall. Sidestepping R x30' with mod A and max cues.   Pt left up in wc with lap belt and all needs in reach.    Therapy Documentation Precautions:  Precautions Precautions: Fall Precaution Comments: L/posterior lean worse with fatigue Restrictions Weight Bearing Restrictions: No General:   Vital Signs: Therapy Vitals Temp: 98.1 F (36.7 C) Temp Source: Oral Pulse Rate: 68 Resp: 17 BP: 125/67 Patient Position (if appropriate): Sitting Oxygen Therapy SpO2: 98 % O2 Device: Not Delivered Pain: none     See Function Navigator for Current Functional Status.   Therapy/Group: Individual Therapy  Jannessa Ogden Virl CageyM  Tremeka Helbling, PT, DPT  08/19/2016, 2:19 PM

## 2016-08-19 NOTE — Progress Notes (Signed)
Speech Language Pathology Weekly Progress and Session Note  Patient Details  Name: [name] MRN: [id number] Date of Birth: May 01, 1931  Beginning of progress report period: August 12, 2016 End of progress report period: August 19, 2016  Today's Date: 08/19/2016 SLP Individual Time: 0830-0930 SLP Individual Time Calculation (min): 60 min  Short Term Goals: Week 1: SLP Short Term Goal 1 (Week 1): Patient will solve basic problems realted to self-care with Mod assist  SLP Short Term Goal 1 - Progress (Week 1): Met SLP Short Term Goal 2 (Week 1): Patient will verbally identify 2 physical and 2 cognitive changes wtih Mod question cues  SLP Short Term Goal 2 - Progress (Week 1): Not met SLP Short Term Goal 3 (Week 1): Patient will consume Dys.3 textures and thin liquids with Mod assist verbal question cues to recall and utilize safe swallow strategeis to manage oral residue and prevent overt s/s of aspiration.   SLP Short Term Goal 3 - Progress (Week 1): Met SLP Short Term Goal 4 (Week 1): Patient will sustain attention to familiar tasks for 10-20 minutes with Mod assist verbal cues for redirection SLP Short Term Goal 4 - Progress (Week 1): Met SLP Short Term Goal 5 (Week 1): Patient will utilize speech intelligibilty strategies at the phrase-sentence level of verbal expression with Mod assist verbal cues  SLP Short Term Goal 5 - Progress (Week 1): Not met    New Short Term Goals: Week 2: SLP Short Term Goal 1 (Week 2): Patient will solve basic problems related to self-care with Min assist. SLP Short Term Goal 2 (Week 2): Patient will verbally identify 2 physical and 2 cognitive changes wtih Mod question cues  SLP Short Term Goal 3 (Week 2): Patient will consume Dys.3 textures and thin liquids with Min assist verbal question cues to recall and utilize safe swallow strategeis to manage oral residue and prevent overt s/s of aspiration.   SLP Short Term Goal 4 (Week 2): Patient will sustain  attention to familiar tasks for 10-20 minutes with Min assist verbal cues for redirection SLP Short Term Goal 5 (Week 2): Patient will utilize speech intelligibilty strategies at the phrase-sentence level of verbal expression with Mod assist verbal cues   Weekly Progress Updates: Pt has made steady progress this reporting period as evidenced by meeting 3 of 5 STGs. Pt with progress in basic problems related to self-care, consuming dysphagia 3 diet with thin liquids with decreased assist and sustaining attention to task. Pt continues to require Mod A verbal cues to attend to right of midline. Pt continues to require Max A to Mod A verbal cues for speech intelligibility at the phrase/sentence level for ~50 to 75% intelligibility. Skilled ST is required to advance pt's ability in basic problem solving, intellectual awareness, speech intelligibility and attention in order to increase functional independence and reduce caregiver burden prior to discharge.      Intensity: Minumum of 1-2 x/day, 30 to 90 minutes Frequency: 1 to 3 out of 7 days Duration/Length of Stay: 16-18 days  Treatment/Interventions: Cognitive remediation/compensation;Cueing hierarchy;Environmental controls;Functional tasks;Internal/external aids;Patient/family education;Other (comment);Speech/Language facilitation   Daily Session  Skilled Therapeutic Interventions: Skilled treatment session focused on dysphagia and cognition goals. SLP facilitated session by providing Min A to supervision cues for use of compensatory swallow strategies. Pt consumed dysphagia 3 with thin liquids without overt s/s of aspiration. MD present at beginning of session and recommending pushing oral liquids with pt to decrease hydration. Pt required Mod A to Min A  verbal to locate items on the right of his tray. Pt's speech intelligibility was better this session. With Min A verbal cues pt was ~90% intelligible at the simple sentence level. Pt left uprightin bed,  bed alarm on and all needs within reach. Continue current plan of care.       Function:   Eating Eating   Modified Consistency Diet: Yes Eating Assist Level: Supervision or verbal cues;Helper checks for pocketed food   Eating Set Up Assist For: Opening containers Helper Scoops Food on Utensil: Occasionally Helper Brings Food to Mouth: Occasionally   Cognition Comprehension Comprehension assist level: Understands basic 75 - 89% of the time/ requires cueing 10 - 24% of the time  Expression   Expression assist level: Expresses basic 75 - 89% of the time/requires cueing 10 - 24% of the time. Needs helper to occlude trach/needs to repeat words.  Social Interaction Social Interaction assist level: Interacts appropriately 75 - 89% of the time - Needs redirection for appropriate language or to initiate interaction.  Problem Solving Problem solving assist level: Solves basic 50 - 74% of the time/requires cueing 25 - 49% of the time  Memory Memory assist level: Recognizes or recalls 50 - 74% of the time/requires cueing 25 - 49% of the time   General    Pain    Therapy/Group: Individual Therapy   Johnasia Liese B. Rutherford Nail, M.S., CCC-SLP Speech-Language Pathologist   Yani Lal 08/19/2016, 8:50 AM

## 2016-08-19 NOTE — Progress Notes (Signed)
Subjective/Complaints:  Patient resting in bed today. He is getting ready for breakfast. Discussed with speech therapy, encouraging fluid intake so IV fluids can be discontinued  Review of systems denies any pain with urination. No problems with his breathing. No joint pains. No chest pain  Objective: Vital Signs: Blood pressure (!) 154/72, pulse 71, temperature 98.2 F (36.8 C), temperature source Tympanic, resp. rate 16, height '5\' 9"'  (1.753 m), weight 82 kg (180 lb 12.4 oz), SpO2 92 %. No results found. Results for orders placed or performed during the hospital encounter of 08/12/16 (from the past 72 hour(s))  CBC with Differential/Platelet     Status: Abnormal   Collection Time: 08/16/16 10:25 AM  Result Value Ref Range   WBC 10.7 (H) 4.0 - 10.5 K/uL   RBC 4.59 4.22 - 5.81 MIL/uL   Hemoglobin 14.5 13.0 - 17.0 g/dL   HCT 42.5 39.0 - 52.0 %   MCV 92.6 78.0 - 100.0 fL   MCH 31.6 26.0 - 34.0 pg   MCHC 34.1 30.0 - 36.0 g/dL   RDW 13.9 11.5 - 15.5 %   Platelets 224 150 - 400 K/uL   Neutrophils Relative % 77 %   Neutro Abs 8.2 (H) 1.7 - 7.7 K/uL   Lymphocytes Relative 8 %   Lymphs Abs 0.8 0.7 - 4.0 K/uL   Monocytes Relative 12 %   Monocytes Absolute 1.3 (H) 0.1 - 1.0 K/uL   Eosinophils Relative 3 %   Eosinophils Absolute 0.3 0.0 - 0.7 K/uL   Basophils Relative 0 %   Basophils Absolute 0.0 0.0 - 0.1 K/uL  Basic metabolic panel     Status: Abnormal   Collection Time: 08/16/16 10:25 AM  Result Value Ref Range   Sodium 137 135 - 145 mmol/L   Potassium 3.2 (L) 3.5 - 5.1 mmol/L   Chloride 101 101 - 111 mmol/L   CO2 26 22 - 32 mmol/L   Glucose, Bld 105 (H) 65 - 99 mg/dL   BUN 36 (H) 6 - 20 mg/dL   Creatinine, Ser 1.58 (H) 0.61 - 1.24 mg/dL   Calcium 9.0 8.9 - 10.3 mg/dL   GFR calc non Af Amer 39 (L) >60 mL/min   GFR calc Af Amer 45 (L) >60 mL/min    Comment: (NOTE) The eGFR has been calculated using the CKD EPI equation. This calculation has not been validated in all  clinical situations. eGFR's persistently <60 mL/min signify possible Chronic Kidney Disease.    Anion gap 10 5 - 15     HEENT: Poor dentition, upper earlobe with dermatitis Cardio: RRR and No murmur Resp: CTA B/L and Unlabored GI: BS positive and Nontender, nondistended Extremity:  No Edema Skin:   Other Dry with poor toenail care Neuro: Lethargic, Abnormal Sensory Withdraws to pinch. Bilateral upper and lower limbs, further assessment not possible due to mental status and Abnormal Motor Patient does move antigravity. 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 No pain with passive range of motion Gen. no acute distress   Assessment/Plan: 1. 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 Physiatrist is providing close team supervision and 24 hour management of active medical problems listed below. Physiatrist and rehab team continue to assess barriers to discharge/monitor patient progress toward functional  and medical goals. FIM: Function - Bathing Position: Shower Body parts bathed by patient: Right arm, Left arm, Chest, Abdomen, Front perineal area, Buttocks, Right upper leg, Left upper leg, Right lower leg, Left lower leg Body parts bathed by helper: Back Bathing not applicable: Chest, Abdomen, Right upper leg, Left upper leg, Right lower leg, Left lower leg Assist Level: Touching or steadying assistance(Pt > 75%)  Function- Upper Body Dressing/Undressing What is the patient wearing?: Pull over shirt/dress Pull over shirt/dress - Perfomed by patient: Thread/unthread right sleeve, Thread/unthread left sleeve, Put head through opening Pull over shirt/dress - Perfomed by helper: Pull shirt over trunk Button up shirt -  Perfomed by patient: Thread/unthread left sleeve, Thread/unthread right sleeve Button up shirt - Perfomed by helper: Thread/unthread right sleeve, Thread/unthread left sleeve, Pull shirt around back, Button/unbutton shirt Assist Level: Supervision or verbal cues Function - Lower Body Dressing/Undressing What is the patient wearing?: Pants, Socks, Shoes, Underwear Position: Sitting EOB Underwear - Performed by patient: Thread/unthread right underwear leg, Thread/unthread left underwear leg, Pull underwear up/down Pants- Performed by patient: Thread/unthread right pants leg, Thread/unthread left pants leg, Pull pants up/down Pants- Performed by helper: Pull pants up/down Non-skid slipper socks- Performed by helper: Don/doff right sock, Don/doff left sock Socks - Performed by patient: Don/doff right sock, Don/doff left sock Socks - Performed by helper: Don/doff right sock, Don/doff left sock (Therapist assist secondary to time.) Shoes - Performed by patient: Don/doff right shoe, Don/doff left shoe, Fasten right, Fasten left Shoes - Performed by helper: Don/doff right shoe, Don/doff left shoe, Fasten right, Fasten left Assist for footwear: Supervision/touching assist Assist for lower body dressing: Touching or steadying assistance (Pt > 75%)  Function - Toileting Toileting activity did not occur: Safety/medical concerns Toileting steps completed by patient: Performs perineal hygiene Toileting steps completed by helper: Adjust clothing prior to toileting, Performs perineal hygiene, Adjust clothing after toileting Toileting Assistive Devices: Grab bar or rail Assist level: Touching or steadying assistance (Pt.75%)  Function - Air cabin crew transfer activity did not occur: Safety/medical concerns Assist level to toilet: Moderate assist (Pt 50 - 74%/lift or lower) Assist level from toilet: Moderate assist (Pt 50 - 74%/lift or lower)  Function - Chair/bed transfer Chair/bed transfer  activity did not occur: Safety/medical concerns Chair/bed transfer method: Stand pivot Chair/bed transfer assist level: Moderate assist (Pt 50 - 74%/lift or lower) Chair/bed transfer assistive device: Armrests Chair/bed transfer details: Verbal cues for sequencing, Verbal cues for technique, Verbal cues for precautions/safety, Verbal cues for safe use of DME/AE, Tactile cues for placement, Tactile cues for weight shifting, Tactile cues for posture  Function - Locomotion: Wheelchair Type: Manual Max wheelchair distance: 199f  Assist Level: Supervision or verbal cues Assist Level: Supervision or verbal cues Assist Level: Supervision or verbal cues Turns around,maneuvers to table,bed, and toilet,negotiates 3% grade,maneuvers on rugs and over doorsills: No Function - Locomotion: Ambulation Assistive device: Walker-rolling Max distance: 733fAssist level: Moderate assist (Pt 50 - 74%) Assist level: Moderate assist (Pt 50 - 74%) Assist level: 2 helpers Walk 150 feet activity did not occur: Safety/medical concerns Walk 10 feet on uneven surfaces activity did not occur: Safety/medical concerns  Function - Comprehension Comprehension: Auditory Comprehension assist level: Understands basic 75 - 89% of the time/ requires cueing 10 - 24% of the time  Function - Expression Expression: Verbal Expression assist level: Expresses basic 75 - 89% of the time/requires cueing 10 - 24% of the time. Needs helper to occlude trach/needs to repeat words.  Function -  Social Interaction Social Interaction assist level: Interacts appropriately 75 - 89% of the time - Needs redirection for appropriate language or to initiate interaction.  Function - Problem Solving Problem solving assist level: Solves basic 50 - 74% of the time/requires cueing 25 - 49% of the time  Function - Memory Memory assist level: Recognizes or recalls 50 - 74% of the time/requires cueing 25 - 49% of the time Patient normally able to  recall (first 3 days only): That he or she is in a hospital  Medical Problem List and Plan: 1.  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  Cont CIR PT, OT, SLP, speech therapy to work on fluid intake 2.  DVT Prophylaxis/Anticoagulation: Pharmaceutical: Lovenox 3. Pain Management: Tylenol prn for knee pain 4. Mood: LCSW to follow for evaluation and support.  5. Neuropsych: This patient is not fully capable of making decisions on his own behalf. 6. Skin/Wound Care: routine pressure relief  7. Fluids/Electrolytes/Nutrition: Monitor I/O. IV fluids, repeat BMET and hopefully d/c IVF 8. PAF: Monitor HR bid.  Vitals:   08/18/16 1700 08/19/16 0433  BP: (!) 152/67 (!) 154/72  Pulse: 63 71  Resp: 17 16  Temp: 98 F (36.7 C) 98.2 F (36.8 C)   9. HTN: Monitor BP. Cont meds, , Bisoprolol need to reduce doseto 5 mg ,does not require  prn catapres, monitor, BP creeping up again due to improving hydration, may need to make further adjustments 10. Knee OA: Meds as necessary 11. Parkinson's disease: Cont to monitor 12. Leukocytosis, UA positive, culture shows mixed species, now afeb, will not recult at this time, cont abx     Repeat CBC   Shows near normalization of WBCs                                                                             13. AKI IVF for pre renal, not on nephrotoxic meds, IVF ,  13. Hypokalemia, supplement orally, likely secondary to IV fluids, repeat BMET today after supplementation LOS (Days) 7 A FACE TO FACE EVALUATION WAS PERFORMED  Lynisha Osuch E 08/19/2016, 6:13 AM

## 2016-08-20 ENCOUNTER — Inpatient Hospital Stay (HOSPITAL_COMMUNITY): Payer: Medicare Other | Admitting: Occupational Therapy

## 2016-08-20 ENCOUNTER — Inpatient Hospital Stay (HOSPITAL_COMMUNITY): Payer: Medicare Other | Admitting: Physical Therapy

## 2016-08-20 ENCOUNTER — Inpatient Hospital Stay (HOSPITAL_COMMUNITY): Payer: Medicare Other

## 2016-08-20 ENCOUNTER — Inpatient Hospital Stay (HOSPITAL_COMMUNITY): Payer: Medicare Other | Admitting: Speech Pathology

## 2016-08-20 NOTE — Progress Notes (Signed)
Speech Language Pathology Daily Session Note  Patient Details  Name: [name] MRN: [id number] Date of Birth: 1931/05/01  Today's Date: 08/20/2016 SLP Individual Time: 1100-1200 SLP Individual Time Calculation (min): 60 min  Short Term Goals: Week 2: SLP Short Term Goal 1 (Week 2): Patient will solve basic problems related to self-care with Min assist. SLP Short Term Goal 2 (Week 2): Patient will verbally identify 2 physical and 2 cognitive changes wtih Mod question cues  SLP Short Term Goal 3 (Week 2): Patient will consume Dys.3 textures and thin liquids with Min assist verbal question cues to recall and utilize safe swallow strategeis to manage oral residue and prevent overt s/s of aspiration.   SLP Short Term Goal 4 (Week 2): Patient will sustain attention to familiar tasks for 10-20 minutes with Min assist verbal cues for redirection SLP Short Term Goal 5 (Week 2): Patient will utilize speech intelligibilty strategies at the phrase-sentence level of verbal expression with Mod assist verbal cues   Skilled Therapeutic Interventions: Skilled treatment focused on dysphagia and cognition goals. SLP facilitated session by providing skilled observation with thin liquids via straw in moderately distractive environment. Pt didn't display any overt s/s of aspiration. Pt is appropriate to consume thin liquids without supervision. Education provided to pt on need to increase liquid consumption to increase hydration. Pt required Mod A verbal cues to complete simple peg placement task for imitation of pattern. Pt required Mod A verbal cues to scan to right of midline. Pt was returned to room, left upright in wheelchair, safety belt donned and all needs within reach. Wife and son were present. Education provided to nursing on new order to consume thin liquids without supervision. Continue per current plan of care.      Function:  Eating Eating   Modified Consistency Diet: Yes Eating Assist Level:  Supervision or verbal cues;Helper checks for pocketed food   Eating Set Up Assist For: Opening containers       Cognition Comprehension Comprehension assist level: Understands basic 75 - 89% of the time/ requires cueing 10 - 24% of the time  Expression   Expression assist level: Expresses basic 50 - 74% of the time/requires cueing 25 - 49% of the time. Needs to repeat parts of sentences.  Social Interaction Social Interaction assist level: Interacts appropriately 75 - 89% of the time - Needs redirection for appropriate language or to initiate interaction.  Problem Solving Problem solving assist level: Solves basic 50 - 74% of the time/requires cueing 25 - 49% of the time  Memory Memory assist level: Recognizes or recalls 50 - 74% of the time/requires cueing 25 - 49% of the time    Pain    Therapy/Group: Individual Therapy   Lekia Nier B. Dreama Saaverton, M.S., CCC-SLP Speech-Language Pathologist   Jearldine Cassady 08/20/2016, 12:02 PM

## 2016-08-20 NOTE — Progress Notes (Signed)
Physical Therapy Session Note  Patient Details  Name: [name] MRN: [id number] Date of Birth: 09/07/1930  Today's Date: 08/20/2016 PT Individual Time: 1415-1500 PT Individual Time Calculation (min): 45 min   Short Term Goals: Week 1:  PT Short Term Goal 1 (Week 1): Pt will maintain midline orientation in sitting x5 minutes with min cues PT Short Term Goal 2 (Week 1): Pt will self propel w/c x100' with min assist PT Short Term Goal 3 (Week 1): Pt will negotiate 4 steps with 1 assist   Skilled Therapeutic Interventions/Progress Updates:    Pt up in w/c upon arrival and able to propel self to/from gym. Ambulation performed initially using rw X140 ft with min assist for balance, midline orientation and rw control. Attempting amb without device and frequent multimodal cues for weightbearing/shift to rt. Repeating 30 ft X2 and 20 ft X1. Active rest breaks incorporating side leaning to rt with visual scanning for objects in rt visual field. Pt propelling w/c back to room after session. Up in w/c with safety belt in place, spouse present and all needs in reach.   Therapy Documentation Precautions:  Precautions Precautions: Fall Precaution Comments: left lean in sitting and standing Restrictions Weight Bearing Restrictions: No  Pain:  Pt denies any pain.   See Function Navigator for Current Functional Status.   Therapy/Group: Individual Therapy  Christiane HaBenjamin J. Vinicio Lynk, PT, CSCS Pager [phone] Office [phone]  08/20/2016, 3:56 PM

## 2016-08-20 NOTE — Progress Notes (Signed)
Occupational Therapy Session Note  Patient Details  Name: [name] MRN: [id number] Date of Birth: [birth year]  Today's Date: 08/20/2016 OT Individual Time: 0800-0900 OT Individual Time Calculation (min): 60 min    Short Term Goals: Week 1:  OT Short Term Goal 1 (Week 1): Pt will maintain dynamic sitting balance during ADL tasks with no more than min assist.  OT Short Term Goal 2 (Week 1): Pt will complete UB dressing with min assist sitting unsupported.  OT Short Term Goal 3 (Week 1): Pt will complete LB bathing with mod assist sit to stand.   OT Short Term Goal 4 (Week 1): Pt will complete LB dressing with mod assist sit to stand.   OT Short Term Goal 5 (Week 1): Pt will complete toilet transfer with RW to 3:1 with mod assist 2 consecutive trials.   Skilled Therapeutic Interventions/Progress Updates:    Pt completed transfer from supine to sit EOB with supervision during session.  Mod assist for transfer to the wheelchair with RW secondary to LOB to the left and decreased ability to manage walker.  Worked on bathing at the sink with emphasis on unsupported sitting.  Max demonstrational cueing to maintain midline orientation while bathing secondary to pushing to the left, and not spontaneously correcting it.  Sit to stand overall min assist level but pt needs mod assist to maintain standing balance while attempting to wash peri area or pull up brief and pants.  Moderate trunk and cervical flexion noted in standing as well as pushing to the left side.  Encouraged hemidressing techniques for donning pants and socks.  He was able to successfully donn his pants over both LEs, starting with the left side first, but needed mod assist for sit to stand to pull them over his hips.  Mod assist for donning the leftt sock but he was able to prop his foot up on a foot stool for donning the right with increased time and supervision.  Shoes also completed with setup only.  Pt left positioned in wheelchair  with safety belt in place and call button in reach.    Therapy Documentation Precautions:  Precautions Precautions: Fall Precaution Comments: left lean in sitting and standing Restrictions Weight Bearing Restrictions: No  Pain: Pain Assessment Pain Assessment: No/denies pain ADL: ADL ADL Comments: See Functional Tool for details   See Function Navigator for Current Functional Status.   Therapy/Group: Individual Therapy  Anamae Rochelle OTR/L 08/20/2016, 12:14 PM

## 2016-08-20 NOTE — Progress Notes (Addendum)
Subjective/Complaints:  Pt in bed awake and alert  Review of systems denies any pain with urination. No problems with his breathing. No joint pains. No chest pain  Objective: Vital Signs: Blood pressure (!) 148/78, pulse 83, temperature 98.1 F (36.7 C), temperature source Oral, resp. rate 17, height '5\' 9"'  (1.753 m), weight 82 kg (180 lb 12.4 oz), SpO2 98 %. No results found. Results for orders placed or performed during the hospital encounter of 08/12/16 (from the past 72 hour(s))  CBC     Status: Abnormal   Collection Time: 08/19/16  5:46 AM  Result Value Ref Range   WBC 13.0 (H) 4.0 - 10.5 K/uL   RBC 4.19 (L) 4.22 - 5.81 MIL/uL   Hemoglobin 13.1 13.0 - 17.0 g/dL   HCT 38.2 (L) 39.0 - 52.0 %   MCV 91.2 78.0 - 100.0 fL   MCH 31.3 26.0 - 34.0 pg   MCHC 34.3 30.0 - 36.0 g/dL   RDW 13.6 11.5 - 15.5 %   Platelets 235 150 - 400 K/uL  Basic metabolic panel     Status: Abnormal   Collection Time: 08/19/16  5:46 AM  Result Value Ref Range   Sodium 135 135 - 145 mmol/L   Potassium 4.0 3.5 - 5.1 mmol/L   Chloride 105 101 - 111 mmol/L   CO2 23 22 - 32 mmol/L   Glucose, Bld 93 65 - 99 mg/dL   BUN 18 6 - 20 mg/dL   Creatinine, Ser 1.25 (H) 0.61 - 1.24 mg/dL   Calcium 8.4 (L) 8.9 - 10.3 mg/dL   GFR calc non Af Amer 51 (L) >60 mL/min   GFR calc Af Amer 60 (L) >60 mL/min    Comment: (NOTE) The eGFR has been calculated using the CKD EPI equation. This calculation has not been validated in all clinical situations. eGFR's persistently <60 mL/min signify possible Chronic Kidney Disease.    Anion gap 7 5 - 15     HEENT: Poor dentition, upper earlobe with dermatitis Cardio: RRR and No murmur Resp: CTA B/L and Unlabored GI: BS positive and Nontender, nondistended Extremity:  No Edema Skin:   Other Dry with poor toenail care Neuro: Lethargic, Abnormal Sensory Withdraws to pinch. Bilateral upper and lower limbs, further assessment not possible due to mental status and Abnormal Motor  Patient does move antigravity. 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 No pain with passive range of motion Gen. no acute distress   Assessment/Plan: 1. 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 Physiatrist is providing close team supervision and 24 hour management of active medical problems listed below. Physiatrist and rehab team continue to assess barriers to discharge/monitor patient progress toward functional and medical goals. FIM: Function - Bathing Position: Shower Body parts bathed by patient: Right arm, Left arm, Chest, Abdomen, Right upper leg, Left upper leg, Front perineal area, Right lower leg, Left lower leg Body parts bathed by helper: Back, Buttocks Bathing not applicable: Chest, Abdomen, Right upper leg, Left upper leg, Right lower leg, Left lower leg Assist Level: Touching or steadying assistance(Pt > 75%)  Function- Upper Body Dressing/Undressing What is the patient wearing?: Pull over shirt/dress Pull over shirt/dress - Perfomed by patient: Thread/unthread left sleeve, Thread/unthread right sleeve Pull over shirt/dress -  Perfomed by helper: Pull shirt over trunk, Put head through opening Button up shirt - Perfomed by patient: Thread/unthread left sleeve, Thread/unthread right sleeve Button up shirt - Perfomed by helper: Thread/unthread right sleeve, Thread/unthread left sleeve, Pull shirt around back, Button/unbutton shirt Assist Level: Supervision or verbal cues Function - Lower Body Dressing/Undressing What is the patient wearing?: Pants, Socks, Shoes, Non-skid slipper socks Position: Wheelchair/chair at sink Underwear - Performed by patient: Thread/unthread right underwear leg, Thread/unthread left  underwear leg, Pull underwear up/down Pants- Performed by patient: Thread/unthread right pants leg, Thread/unthread left pants leg Pants- Performed by helper: Pull pants up/down Non-skid slipper socks- Performed by helper: Don/doff right sock Socks - Performed by patient: Don/doff left sock Socks - Performed by helper: Don/doff right sock, Don/doff left sock (Therapist assist secondary to time.) Shoes - Performed by patient: Don/doff right shoe, Don/doff left shoe, Fasten right, Fasten left Shoes - Performed by helper: Don/doff right shoe, Don/doff left shoe, Fasten right, Fasten left (Therapist assist secondary to time.) Assist for footwear: Supervision/touching assist Assist for lower body dressing: Touching or steadying assistance (Pt > 75%)  Function - Toileting Toileting activity did not occur: Safety/medical concerns Toileting steps completed by patient: Performs perineal hygiene, Adjust clothing prior to toileting Toileting steps completed by helper: Adjust clothing after toileting Toileting Assistive Devices: Grab bar or rail Assist level: Touching or steadying assistance (Pt.75%)  Function - Air cabin crew transfer activity did not occur: Safety/medical concerns Toilet transfer assistive device: Grab bar, Elevated toilet seat/BSC over toilet Assist level to toilet: Touching or steadying assistance (Pt > 75%) Assist level from toilet: Touching or steadying assistance (Pt > 75%)  Function - Chair/bed transfer Chair/bed transfer activity did not occur: Safety/medical concerns Chair/bed transfer method: Squat pivot Chair/bed transfer assist level: Touching or steadying assistance (Pt > 75%) Chair/bed transfer assistive device: Armrests Chair/bed transfer details: Verbal cues for sequencing, Verbal cues for technique, Verbal cues for precautions/safety, Verbal cues for safe use of DME/AE, Tactile cues for placement, Tactile cues for weight shifting, Tactile cues for  posture  Function - Locomotion: Wheelchair Type: Manual Max wheelchair distance: 162f  Assist Level: Dependent (Pt equals 0%) (time management) Assist Level: Supervision or verbal cues Assist Level: Supervision or verbal cues Turns around,maneuvers to table,bed, and toilet,negotiates 3% grade,maneuvers on rugs and over doorsills: No Function - Locomotion: Ambulation Assistive device: No device Max distance: 631 Assist level: Touching or steadying assistance (Pt > 75%) Assist level: Touching or steadying assistance (Pt > 75%) Assist level: Moderate assist (Pt 50 - 74%) Walk 150 feet activity did not occur: Safety/medical concerns Walk 10 feet on uneven surfaces activity did not occur: Safety/medical concerns  Function - Comprehension Comprehension: Auditory Comprehension assist level: Understands basic 75 - 89% of the time/ requires cueing 10 - 24% of the time  Function - Expression Expression: Verbal Expression assist level: Expresses basic 50 - 74% of the time/requires cueing 25 - 49% of the time. Needs to repeat parts of sentences.  Function - Social Interaction Social Interaction assist level: Interacts appropriately 75 - 89% of the time - Needs redirection for appropriate language or to initiate interaction.  Function - Problem Solving Problem solving assist level: Solves basic 50 - 74% of the time/requires cueing 25 - 49% of the time  Function - Memory Memory assist level: Recognizes or recalls 50 - 74% of the time/requires cueing 25 - 49% of the time Patient normally able to recall (first 3 days only): That he or she is  in a hospital  Medical Problem List and Plan: 1.  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  Cont CIR PT, OT, SLP,Team conference tomorrow 2.  DVT Prophylaxis/Anticoagulation: Pharmaceutical: Lovenox 3. Pain Management: Tylenol prn for knee pain 4. Mood: LCSW to follow for evaluation and support.   5. Neuropsych: This patient is not fully capable of making decisions on his own behalf. 6. Skin/Wound Care: routine pressure relief  7. Fluids/Electrolytes/Nutrition: Monitor I/O.  repeat BUN/Cr normalized  d/c IVF 8. PAF: Monitor HR bid.  Vitals:   08/19/16 1254 08/20/16 0446  BP: 125/67 (!) 148/78  Pulse: 68 83  Resp: 17 17  Temp: 98.1 F (36.7 C) 98.1 F (36.7 C)   9. HTN: Monitor BP. Cont meds, , Bisoprolol need to reduce doseto 5 mg ,does not require  prn catapres, monitor, BP creeping up again due to improving hydration, may need to make further adjustments 10. Knee OA: Meds as necessary 11. Parkinson's disease: Cont to monitor 12. Leukocytosis , UA positive, UTI day 6/7 abx Repeat CBC   Shows near normalization of WBCs                                                                             13. AKI IVF for pre renal, not on nephrotoxic meds, IVF ,  13. Hypokalemia likely related to IVF,resolved will D/C KCL and repeat BMET in several days LOS (Days) [place] E 08/20/2016, 12:22 PM

## 2016-08-20 NOTE — Progress Notes (Signed)
Physical Therapy Session Note  Patient Details  Name: [name] MRN: [id number] Date of Birth: Nov 02, 1930  Today's Date: 08/20/2016 PT Individual Time: [id number] PT Individual Time Calculation (min): 45 min   Short Term Goals: Week 1:  PT Short Term Goal 1 (Week 1): Pt will maintain midline orientation in sitting x5 minutes with min cues PT Short Term Goal 2 (Week 1): Pt will self propel w/c x100' with min assist PT Short Term Goal 3 (Week 1): Pt will negotiate 4 steps with 1 assist   Skilled Therapeutic Interventions/Progress Updates:    Session focused on neuro re-ed to address postural control re-training, re-orientation to midline to decrease pushing tendencies in seated and standing positions, R inattention, graded movements with sit <> stands and transfers, balance, and gait training without AD. Pt required min assist for squat pivot from w/c to mat with cues for head/hips relationship and hand placement, steadying assist for balance and extra time for processing. Blocked practice sit <> stands with min to mod assist with cues for eye contact to R looking at PT's left shoulder or pt's R knee to increase weightbearing to the R due to pushing to the L. Propped on elbow while engaging in trunk elongation/shortening activities and attention addressed. In standing progressed to reaching for horseshoes to increase weightshift to the R and address postural control and re-orientation to midline with min to mod assist. Pt able to gait x 65' with min assist for weightshift facilitation and tactile cues as well min assist but progressing to mod assist especially during turns and increasing with fatigue.   Therapy Documentation Precautions:  Precautions Precautions: Fall Precaution Comments: left lean in sitting and standing Restrictions Weight Bearing Restrictions: No  Pain: Pain Assessment Pain Assessment: No/denies pain   See Function Navigator for Current Functional  Status.   Therapy/Group: Individual Therapy  Karolee StampsGray, Frady Taddeo Darrol PokeBrescia  Meriam Chojnowski B. Soley Harriss, PT, DPT  08/20/2016, 12:21 PM

## 2016-08-21 ENCOUNTER — Inpatient Hospital Stay (HOSPITAL_COMMUNITY): Payer: Medicare Other | Admitting: Occupational Therapy

## 2016-08-21 ENCOUNTER — Inpatient Hospital Stay (HOSPITAL_COMMUNITY): Payer: Medicare Other

## 2016-08-21 ENCOUNTER — Inpatient Hospital Stay (HOSPITAL_COMMUNITY): Payer: Medicare Other | Admitting: Speech Pathology

## 2016-08-21 DIAGNOSIS — R339 Retention of urine, unspecified: Secondary | ICD-10-CM

## 2016-08-21 MED ORDER — BISOPROLOL FUMARATE 10 MG PO TABS
10.0000 mg | ORAL_TABLET | Freq: Every day | ORAL | Status: DC
  Administered 2016-08-21 – 2016-09-03 (×14): 10 mg via ORAL
  Filled 2016-08-21 (×14): qty 1

## 2016-08-21 MED ORDER — TAMSULOSIN HCL 0.4 MG PO CAPS
0.4000 mg | ORAL_CAPSULE | Freq: Every day | ORAL | Status: DC
  Administered 2016-08-21 – 2016-08-27 (×7): 0.4 mg via ORAL
  Filled 2016-08-21 (×7): qty 1

## 2016-08-21 NOTE — Progress Notes (Signed)
Social Work Patient ID: [name], male   DOB: [birth year], 83 y.o.   MRN: [id number]  Met with pt, wife and son who are here to provide support to pt while in therapies. Discussed team conference progress toward his  Goals and the need to extend his discharge date to 3/27, to be at a more consistent min assist level. All really want to get pt home this time and not to go to a NH. Wife and son can see his slow gains and feel he will benefit From staying a little bit longer and hope his bladder will fix itself with the medicines MD has started. Will work on discharge needs and continue to provide support.

## 2016-08-21 NOTE — Progress Notes (Signed)
Subjective/Complaints:  Patient finished shower, sitting up in chair and dressing. With OT. No new issues overnight. Continues to require intermittent catheterization.  Review of systems denies any pain with urination. No problems with his breathing. No joint pains. No chest pain  Objective: Vital Signs: Blood pressure (!) 154/78, pulse 89, temperature 98.5 F (36.9 C), temperature source Oral, resp. rate 17, height '5\' 9"'  (1.753 m), weight 80.3 kg (177 lb 0.5 oz), SpO2 94 %. No results found. Results for orders placed or performed during the hospital encounter of 08/12/16 (from the past 72 hour(s))  CBC     Status: Abnormal   Collection Time: 08/19/16  5:46 AM  Result Value Ref Range   WBC 13.0 (H) 4.0 - 10.5 K/uL   RBC 4.19 (L) 4.22 - 5.81 MIL/uL   Hemoglobin 13.1 13.0 - 17.0 g/dL   HCT 38.2 (L) 39.0 - 52.0 %   MCV 91.2 78.0 - 100.0 fL   MCH 31.3 26.0 - 34.0 pg   MCHC 34.3 30.0 - 36.0 g/dL   RDW 13.6 11.5 - 15.5 %   Platelets 235 150 - 400 K/uL  Basic metabolic panel     Status: Abnormal   Collection Time: 08/19/16  5:46 AM  Result Value Ref Range   Sodium 135 135 - 145 mmol/L   Potassium 4.0 3.5 - 5.1 mmol/L   Chloride 105 101 - 111 mmol/L   CO2 23 22 - 32 mmol/L   Glucose, Bld 93 65 - 99 mg/dL   BUN 18 6 - 20 mg/dL   Creatinine, Ser 1.25 (H) 0.61 - 1.24 mg/dL   Calcium 8.4 (L) 8.9 - 10.3 mg/dL   GFR calc non Af Amer 51 (L) >60 mL/min   GFR calc Af Amer 60 (L) >60 mL/min    Comment: (NOTE) The eGFR has been calculated using the CKD EPI equation. This calculation has not been validated in all clinical situations. eGFR's persistently <60 mL/min signify possible Chronic Kidney Disease.    Anion gap 7 5 - 15     HEENT: Poor dentition, upper earlobe with dermatitis Cardio: RRR and No murmur Resp: CTA B/L and Unlabored GI: BS positive and Nontender, nondistended Extremity:  No Edema Skin:   Other Dry with poor toenail care Neuro: Lethargic, Abnormal Sensory  Withdraws to pinch. Bilateral upper and lower limbs, further assessment not possible due to mental status and Abnormal Motor Patient does move antigravity. 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 No pain with passive range of motion Gen. no acute distress   Assessment/Plan: 1. 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 Physiatrist is providing close team supervision and 24 hour management of active medical problems listed below. Physiatrist and rehab team continue to assess barriers to discharge/monitor patient progress toward functional and medical goals. FIM: Function - Bathing Position: Shower Body parts bathed by patient: Right arm, Left arm, Chest, Abdomen, Right upper leg, Left upper leg, Front perineal area, Right lower leg, Left lower leg Body parts bathed by helper: Back, Buttocks Bathing not applicable: Chest, Abdomen, Right upper leg, Left upper leg, Right lower leg, Left lower leg Assist Level: Touching or steadying assistance(Pt > 75%)  Function- Upper Body Dressing/Undressing What is the patient wearing?: Pull over shirt/dress Pull over  shirt/dress - Perfomed by patient: Thread/unthread left sleeve, Thread/unthread right sleeve Pull over shirt/dress - Perfomed by helper: Pull shirt over trunk, Put head through opening Button up shirt - Perfomed by patient: Thread/unthread left sleeve, Thread/unthread right sleeve Button up shirt - Perfomed by helper: Thread/unthread right sleeve, Thread/unthread left sleeve, Pull shirt around back, Button/unbutton shirt Assist Level: Supervision or verbal cues Function - Lower Body Dressing/Undressing What is the patient wearing?: Pants, Socks, Shoes, Non-skid slipper  socks Position: Wheelchair/chair at sink Underwear - Performed by patient: Thread/unthread right underwear leg, Thread/unthread left underwear leg, Pull underwear up/down Pants- Performed by patient: Thread/unthread right pants leg, Thread/unthread left pants leg Pants- Performed by helper: Pull pants up/down Non-skid slipper socks- Performed by helper: Don/doff right sock Socks - Performed by patient: Don/doff left sock Socks - Performed by helper: Don/doff right sock, Don/doff left sock (Therapist assist secondary to time.) Shoes - Performed by patient: Don/doff right shoe, Don/doff left shoe, Fasten right, Fasten left Shoes - Performed by helper: Don/doff right shoe, Don/doff left shoe, Fasten right, Fasten left (Therapist assist secondary to time.) Assist for footwear: Supervision/touching assist Assist for lower body dressing: Touching or steadying assistance (Pt > 75%)  Function - Toileting Toileting activity did not occur: Safety/medical concerns Toileting steps completed by patient: Performs perineal hygiene Toileting steps completed by helper: Adjust clothing prior to toileting, Adjust clothing after toileting Toileting Assistive Devices: Grab bar or rail Assist level: Touching or steadying assistance (Pt.75%)  Function - Air cabin crew transfer activity did not occur: Safety/medical concerns Toilet transfer assistive device: Grab bar Assist level to toilet: Touching or steadying assistance (Pt > 75%) Assist level from toilet: Touching or steadying assistance (Pt > 75%)  Function - Chair/bed transfer Chair/bed transfer activity did not occur: Safety/medical concerns Chair/bed transfer method: Ambulatory Chair/bed transfer assist level: Touching or steadying assistance (Pt > 75%) Chair/bed transfer assistive device: Walker Chair/bed transfer details: Tactile cues for weight shifting, Tactile cues for posture, Tactile cues for weight bearing, Visual cues for safe use of  DME/AE, Verbal cues for safe use of DME/AE, Manual facilitation for weight shifting, Manual facilitation for weight bearing  Function - Locomotion: Wheelchair Type: Manual Max wheelchair distance: 150 ft Assist Level: Supervision or verbal cues Assist Level: Supervision or verbal cues Assist Level: Supervision or verbal cues Turns around,maneuvers to table,bed, and toilet,negotiates 3% grade,maneuvers on rugs and over doorsills: No Function - Locomotion: Ambulation Assistive device: No device Max distance: 40 ft Assist level: Touching or steadying assistance (Pt > 75%) Assist level: Touching or steadying assistance (Pt > 75%) Assist level: Moderate assist (Pt 50 - 74%) Walk 150 feet activity did not occur: Safety/medical concerns Walk 10 feet on uneven surfaces activity did not occur: Safety/medical concerns  Function - Comprehension Comprehension: Auditory Comprehension assist level: Understands basic 90% of the time/cues < 10% of the time  Function - Expression Expression: Verbal Expression assist level: Expresses basic 90% of the time/requires cueing < 10% of the time.  Function - Social Interaction Social Interaction assist level: Interacts appropriately 90% of the time - Needs monitoring or encouragement for participation or interaction.  Function - Problem Solving Problem solving assist level: Solves basic 75 - 89% of the time/requires cueing 10 - 24% of the time  Function - Memory Memory assist level: Recognizes or recalls 75 - 89% of the time/requires cueing 10 - 24% of the time Patient normally able to recall (first 3 days only): That he or she is in a hospital  Medical Problem List and Plan: 1.  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  Cont CIR PT, OT, SLP,Team conference today please see physician documentation under team conference tab, met with team face-to-face to discuss problems,progress, and goals.  Formulized individual treatment plan based on medical history, underlying problem and comorbidities. 2.  DVT Prophylaxis/Anticoagulation: Pharmaceutical: Lovenox 3. Pain Management: Tylenol prn for knee pain 4. Mood: LCSW to follow for evaluation and support.  5. Neuropsych: This patient is not fully capable of making decisions on his own behalf. 6. Skin/Wound Care: routine pressure relief  7. Fluids/Electrolytes/Nutrition: Monitor I/O.  repeat BUN/Cr normalized  d/c IVF 8. PAF: Monitor HR bid.  Vitals:   08/20/16 1226 08/21/16 0500  BP: 123/66 (!) 154/78  Pulse: 63 89  Resp: 17 17  Temp: 98.1 F (36.7 C) 98.5 F (36.9 C)   9. HTN: Monitor BP. Cont meds, , increase Zebeta to 95m  10. Knee OA: Meds as necessary 11. Parkinson's disease: Cont to monitor 12. Leukocytosis , UA positive, UTI day 7/7 abx, We will repeat UA. If patient becomes symptomatic                                                                            13. AKI IVF Resolved d/c IV            14. Hypokalemia likely related to IVF,resolved will D/C KCL and repeat BMET in several days LOS (Days) [place] 08/21/2016, 6:26 AM

## 2016-08-21 NOTE — Progress Notes (Signed)
Speech Language Pathology Daily Session Note  Patient Details  Name: [name] MRN: [id number] Date of Birth: Mar 12, 1931  Today's Date: 08/21/2016 SLP Individual Time: [id number] SLP Individual Time Calculation (min): 43 min  Short Term Goals: Week 2: SLP Short Term Goal 1 (Week 2): Patient will solve basic problems related to self-care with Min assist. SLP Short Term Goal 2 (Week 2): Patient will verbally identify 2 physical and 2 cognitive changes wtih Mod question cues  SLP Short Term Goal 3 (Week 2): Patient will consume Dys.3 textures and thin liquids with Min assist verbal question cues to recall and utilize safe swallow strategeis to manage oral residue and prevent overt s/s of aspiration.   SLP Short Term Goal 4 (Week 2): Patient will sustain attention to familiar tasks for 10-20 minutes with Min assist verbal cues for redirection SLP Short Term Goal 5 (Week 2): Patient will utilize speech intelligibilty strategies at the phrase-sentence level of verbal expression with Mod assist verbal cues   Skilled Therapeutic Interventions: Skilled treatment session focused on addressing cognition goals.  SLP facilitated session by providing Min question cues to recall current medications and functions.  Following set-up by SLP patient was able to accurately load a medication box with Min assist verbal and visual cues to self-monitor and correct errors.  Wife present to observe session and acknowledged that she will need to assist with medication management following discharge home.  Continue with current plan of care.    Function:   Cognition Comprehension Comprehension assist level: Understands basic 90% of the time/cues < 10% of the time  Expression   Expression assist level: Expresses basic 75 - 89% of the time/requires cueing 10 - 24% of the time. Needs helper to occlude trach/needs to repeat words.  Social Interaction Social Interaction assist level: Interacts appropriately 90% of the  time - Needs monitoring or encouragement for participation or interaction.  Problem Solving Problem solving assist level: Solves basic 75 - 89% of the time/requires cueing 10 - 24% of the time  Memory Memory assist level: Recognizes or recalls 75 - 89% of the time/requires cueing 10 - 24% of the time    Pain Pain Assessment Pain Assessment: No/denies pain  Therapy/Group: Individual Therapy  Charlane FerrettiMelissa Jarion Hawthorne, M.A., CCC-SLP [phone]  Anshu Wehner 08/21/2016, 4:51 PM

## 2016-08-21 NOTE — Plan of Care (Signed)
Problem: RH BLADDER ELIMINATION Goal: RH STG MANAGE BLADDER WITH ASSISTANCE STG Manage Bladder With mod Assistance   Outcome: Not Progressing Requires total assist in and out caths every 6 hours. flomax added  Goal: RH STG MANAGE BLADDER WITH EQUIPMENT WITH ASSISTANCE STG Manage Bladder With Equipment With mod Assistance   Outcome: Not Progressing Total assist staff completes in and out caths

## 2016-08-21 NOTE — Progress Notes (Signed)
Occupational Therapy Weekly Progress Note  Patient Details  Name: [name] MRN: [id number] Date of Birth: May 05, 1931  Beginning of progress report period: August 13, 2016 End of progress report period: August 21, 2016  Today's Date: 08/21/2016 OT Individual Time: 0539-7673 OT Individual Time Calculation (min): 48 min    Patient has met 4 of 5 short term goals.  Mr. Novello continues to make slow but steady gains with OT at this time.  Mod assist is still needed for sit to stand and standing balance when completing LB selfcare tasks.  In addition, he needs mod assist for all stand pivot transfers as well.  Increased lean and LOB to the left is noted with standing, which requires mod physical assist and max demonstrational cueing to correct.  He maintains flexed trunk and head in sitting with decreased anterior tilt noted when engaged in unsupported self care tasks.  He needs back support secondary to LOB posteriorly if trying to donn UB clothing sitting unsupported as well.  LUE coordination continues to be impaired as well but he is able to use it for pulling up pants and LB clothing with min assist, and can wash his RUE with supervision.  Greater difficulty is noted when attempting FM tasks compared to gross motor use.  Recommend continued OT to continue progression toward min assist to supervision level goals.  Feel his wife will be able to safely assist him at this level.    Patient continues to demonstrate the following deficits: muscle weakness, impaired timing and sequencing, unbalanced muscle activation and decreased coordination, decreased midline orientation and decreased attention to left and decreased sitting balance, decreased standing balance, decreased postural control, hemiplegia and decreased balance strategies and therefore will continue to benefit from skilled OT intervention to enhance overall performance with BADL and Reduce care partner burden.  Patient progressing toward long  term goals..  Continue plan of care.  OT Short Term Goals Week 2:  OT Short Term Goal 1 (Week 2): Pt will complete UB dressing with min assist sitting unsupported.  OT Short Term Goal 2 (Week 2): Pt will complete LB dressing sit to stand with min assist for 2 consecutive sessions. OT Short Term Goal 3 (Week 2): Pt will complete walk-in shower transfers with min assist using the RW.  OT Short Term Goal 4 (Week 2): Pt will maintain standing balance with LB selfcare with min assist and no more than mod instructional cueing for midline orientation.   OT Short Term Goal 5 (Week 2): Pt will complete toilet transfers with min assist to 3:1 with RW.   Skilled Therapeutic Interventions/Progress Updates:    Pt completed bathing and dressing sit to stand at the sink this session.  Mod assist for all sit to stand transitions and standing balance when washing peri area and pulling pants and brief over his hips.  Still with increased lean to the left in sitting as well as with standing.  Max demonstrational cueing to correct left lean in both positions with mod facilitation needed for correction in standing.  He is able to complete UB bathing sitting unsupported with close supervision but needed mod assist for donning pullover shirt.  Mod instructional cueing  for orientation of the shirt, as he was donning it backwards initially.  Decreased ability to pull it over his head and down over his back.  Therapist assisted with donning pants, socks, and shoes secondary to decreased time.  He was able to comb his hair with setup but needed  min assist with preparing dentures to put in.  Pt was left in wheelchair with call button in reach and safety belt in place.      Therapy Documentation Precautions:  Precautions Precautions: Fall Precaution Comments: left lean in sitting and standing Restrictions Weight Bearing Restrictions: No  Pain:  No report of pain during session.  ADL: See Function Navigator for Current  Functional Status.   Therapy/Group: Individual Therapy  Nariah Morgano OTR/L 08/21/2016, 12:34 PM

## 2016-08-21 NOTE — Plan of Care (Signed)
Problem: Consults Goal: RH STROKE PATIENT EDUCATION See Patient Education module for education specifics   Outcome: Progressing Patient and wife verbalized understanding and signs of stroke

## 2016-08-21 NOTE — Progress Notes (Signed)
Occupational Therapy Session Note  Patient Details  Name: [name] MRN: [id number] Date of Birth: 10-04-1930  Today's Date: 08/21/2016 OT Individual Time: [id number] OT Individual Time Calculation (min): 44 min    Short Term Goals: Week 2:  OT Short Term Goal 1 (Week 2): Pt will complete UB dressing with min assist sitting unsupported.  OT Short Term Goal 2 (Week 2): Pt will complete LB dressing sit to stand with min assist for 2 consecutive sessions. OT Short Term Goal 3 (Week 2): Pt will complete walk-in shower transfers with min assist using the RW.  OT Short Term Goal 4 (Week 2): Pt will maintain standing balance with LB selfcare with min assist and no more than mod instructional cueing for midline orientation.   OT Short Term Goal 5 (Week 2): Pt will complete toilet transfers with min assist to 3:1 with RW.   Skilled Therapeutic Interventions/Progress Updates:    Pt began session with transfer to the toilet with mod assist and no assistive device.  Increased pushing to the left side noted during mobility.  Once in the bathroom he was able to manage his clothing before sitting with min assist.  Peri hygiene completed mostly in sitting with close supervision but he did stand with min assist to use washcloth as well.  Mod assist for pulling brief and pants over hips before ambulating out to the sink for washing his hands.  Mod assist for standing while completing this grooming task.  For next part of session [facility] [employer].  Pt was able to maintain standing with min assist using the RW for support while completing 60 second intervals.  First interval he averaged 2.5 seconds with the slowest time being 3.1 seconds in the inferior right quadrant.  Second set completed with the same average of 2.5 seconds with an increase in reaction time in the lower right quadrant of 2.89 seconds.  Last set was completed with pt completing 33/33 on 3 second intervals  with average reaction time of 1.76 seconds and right lower quadrant at 1.99 seconds.  Pt demonstrated slight right visual field cut with confrontational testing after completion of [employer].  Greater lateral movement noted in the right eye compared to evaluation, but still limited.  He did not report any diplopia with visual scanning.  Pt returned to room via wheelchair with family present.  Call button in reach and safety belt in place.     Therapy Documentation Precautions:  Precautions Precautions: Fall Precaution Comments: left lean in sitting and standing Restrictions Weight Bearing Restrictions: No  Pain: Pain Assessment Pain Assessment: No/denies pain ADL: ADL ADL Comments: See Functional Tool for details  See Function Navigator for Current Functional Status.   Therapy/Group: Individual Therapy  Raylene Carmickle OTR/L 08/21/2016, 3:49 PM

## 2016-08-21 NOTE — Patient Care Conference (Signed)
Inpatient RehabilitationTeam Conference and Plan of Care Update Date: 08/21/2016   Time: 10:50 AM    Patient Name: [name]      Medical Record Number: [id number]  Date of Birth: [birth year] Sex: Male         Room/Bed: 4W08C/4W08C-01 Payor Info: Payor: MEDICARE / Plan: MEDICARE PART A AND B / Product Type: *No Product type* /    Admitting Diagnosis: L   CVA  Admit Date/Time:  08/12/2016  3:07 PM Admission Comments: No comment available   Primary Diagnosis:  <principal problem not specified> Principal Problem: <principal problem not specified>  Patient Active Problem List   Diagnosis Date Noted  . 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 Hyperlipidemia 08/12/2016  . Hypertensive emergency   . Benign essential HTN   . PAF (paroxysmal atrial fibrillation) (HCC)   . History of CVA (cerebrovascular accident)   . Parkinson disease (HCC)   . Primary osteoarthritis of right knee   . Leukocytosis   . AKI (acute kidney injury) [facility]     Expected Discharge Date: Expected Discharge Date: 09/03/16  Team Members Present: Physician leading conference: Dr. Claudette Laws Social Worker Present: Dossie Der, LCSW Nurse Present: Carmie End, RN PT Present: Wanda Plump, Antonietta Jewel, PT OT Present: Perrin Maltese, OT SLP Present: Fae Pippin, SLP PPS Coordinator present : Tora Duck, RN, CRRN     Current Status/Progress Goal Weekly Team Focus  Medical   Fluid intake improved off IV fluids, still has urinary retention, status post antibiotic treatment for UTI  Maintain renal function, improved bladder function  Med management for bladder dysfunction   Bowel/Bladder     I & O cath if no void 6-8 hours-bladder meds started  Cont B & B     Swallow/Nutrition/ Hydration   Dysphagia 3 with thin liquids  Supervision with least restrictive diet  recall and carryover of compensatory swallow strategies   ADL's   Supervison for UB bathing with mod assist  for UB dressing.  Mod assist for LB bathing and dressing, mod assist for functional transfers.   Increased leaning to the left in sitting and standing.  LUE impairment in coordination.    supervison overall with min for dynamic standing balance  selfcare retraining, balance retraining, neuromuscular re-education, LUE coordination, pt/family education   Mobility   min to mod assist overall for transfers; min to max with gait  supervision to min assist overall (min for gait and stairs)  neuro re-ed, balance, postural control, attention to R, coordination, gait, transfers, stairs, strengthening   Communication   Phrase to simple sentence with Mod A to Min A verbal cues for intelligibility  supervision  Not a candidate for RMT, unable to maintain lip seal, increase use of speech intelligibility strategies   Safety/Cognition/ Behavioral Observations  Mod A  Supervision  increase sustained attention, attention to right of midline, basic problem solving   Pain        no pain issues     Skin        monitor skin-preventative         *See Care Plan and progress notes for long and short-term goals.  Barriers to Discharge: See above    Possible Resolutions to Barriers:  Continue rehabilitation medicine treatment    Discharge Planning/Teaching Needs:  Pt progressing slowly will he reach supervision/min level goals? Son off work for a short time-one month and then wife can not provide much physical care. See how he  progresses.      Team Discussion:  Making progress slowly need to extend to reach the goals that were set of min assist level. Increased leaning to the left. Better with his attention. Off IV fluids, but now retaining urine, started on urechloine and flomax. I & O cath if not voided in 6-8 hours. Wife and son here daily and participating in therapies with pt.  Revisions to Treatment Plan:  DC extended to 3/27 to reach goals   Continued Need for Acute Rehabilitation Level of Care: The  patient requires daily medical management by a physician with specialized training in physical medicine and rehabilitation for the following conditions: Daily direction of a multidisciplinary physical rehabilitation program to ensure safe treatment while eliciting the highest outcome that is of practical value to the patient.: Yes Daily medical management of patient stability for increased activity during participation in an intensive rehabilitation regime.: Yes Daily analysis of laboratory values and/or radiology reports with any subsequent need for medication adjustment of medical intervention for : Neurological problems;Urological problems  Lucy ChrisDupree, Aalaya Yadao G 08/22/2016, 8:38 AM

## 2016-08-21 NOTE — Progress Notes (Signed)
Physical Therapy Note  Patient Details  Name: [name] MRN: [id number] Date of Birth: 01/20/1931 Today's Date: 08/21/2016  1100-1200, 60 min individual tx Pain: no c/o  W/c propulsion to/from room using bil LEs to propel over level tile. Stand pivot w/c> mat to R with min assist.  Scooting R without use of UEs, with resultant LOB L.  neuromuscular re-education via forced use, positioning for pelvic mobiliyt and decreasing pusher tendencies by sitting astride backless bench, reaching forward to facilitate femoral-pelvic movement. Dual task of arranging numbered sticky notes requiriing R cervical rotation. Standing activity with R build up shoe to facilitate RLE wt bearing, using R hand to arrange small figures in rows on table in front of him.  Gait with RW, L built- up shoe x 50' with min> mod assist.  Pt consistently had festinating, rapid pace and LOB L after a few steps; frequent stand rest breaks to re-orient to midline.  Shoe build up removed, but pt continued to lean L particularly with upper body, despite max cues for upright posture, forward gaze.  Pt used R hand to wave to a staff member, lifting it off RW, with LOB L with poor awareness. Pt left resting in w/c with quick release belt applied and all needs within reach. Wife present.  See function navigator for current status.   Lafaye Mcelmurry 08/21/2016, 8:00 AM

## 2016-08-22 ENCOUNTER — Inpatient Hospital Stay (HOSPITAL_COMMUNITY): Payer: Medicare Other | Admitting: Physical Therapy

## 2016-08-22 ENCOUNTER — Inpatient Hospital Stay (HOSPITAL_COMMUNITY): Payer: Medicare Other | Admitting: Occupational Therapy

## 2016-08-22 ENCOUNTER — Inpatient Hospital Stay (HOSPITAL_COMMUNITY): Payer: Medicare Other

## 2016-08-22 ENCOUNTER — Inpatient Hospital Stay (HOSPITAL_COMMUNITY): Payer: Medicare Other | Admitting: Speech Pathology

## 2016-08-22 NOTE — Progress Notes (Signed)
Speech Language Pathology Daily Session Note  Patient Details  Name: [name] MRN: [id number] Date of Birth: 1931/04/29  Today's Date: 08/22/2016   Skilled treatment session #1: SLP Individual Time: 1100-1130 SLP Individual Time Calculation (min): 30 min   Skilled treatment session #2: SLP Individual Time: 1430-1500 SLP Individual Time Calculation (min): 30 min  Short Term Goals: Week 2: SLP Short Term Goal 1 (Week 2): Patient will solve basic problems related to self-care with Min assist. SLP Short Term Goal 2 (Week 2): Patient will verbally identify 2 physical and 2 cognitive changes wtih Mod question cues  SLP Short Term Goal 3 (Week 2): Patient will consume Dys.3 textures and thin liquids with Min assist verbal question cues to recall and utilize safe swallow strategeis to manage oral residue and prevent overt s/s of aspiration.   SLP Short Term Goal 4 (Week 2): Patient will sustain attention to familiar tasks for 10-20 minutes with Min assist verbal cues for redirection SLP Short Term Goal 5 (Week 2): Patient will utilize speech intelligibilty strategies at the phrase-sentence level of verbal expression with Mod assist verbal cues   Skilled Therapeutic Interventions:    Skilled treatment session #1: Skilled treatment session focused on cognition goals. SLP facilitated session by providing Min A verbal cues for solving basic to semi-complex problems. Pt able to sustain attention in mildly distracting environment for ~25 minutes with Min A to supervision cues. Pt with decreased intelligibility at the sentence level of ~50% with Mod A verbal cues for use of speech intelligibility strategies. Pt was returned to room, left upright in wheelchair, safety belt donned and wife present with all needs within reach. Continue per current plan of care.   Skilled treatment session #2: Skilled treatment session focused on cognition goals. SLP facilitated session by providing Min A verbal cues for  problem solving novel card game. Pt able to complete with mild increase in time. Pt was returned to room, left upright in wheelchair, safety belt donned, all needs within reach and wife present. Continue per current plan of care.   Function:    Cognition Comprehension Comprehension assist level: Understands basic 90% of the time/cues < 10% of the time  Expression   Expression assist level: Expresses basic 90% of the time/requires cueing < 10% of the time.  Social Interaction Social Interaction assist level: Interacts appropriately 90% of the time - Needs monitoring or encouragement for participation or interaction.  Problem Solving Problem solving assist level: Solves basic 75 - 89% of the time/requires cueing 10 - 24% of the time  Memory Memory assist level: Recognizes or recalls 75 - 89% of the time/requires cueing 10 - 24% of the time    Pain    Therapy/Group: Individual Therapy   Jakiera Ehler B. Dreama Saaverton, M.S., CCC-SLP Speech-Language Pathologist   Melondy Blanchard Dreama Saaverton 08/22/2016, 3:24 PM

## 2016-08-22 NOTE — Progress Notes (Signed)
Occupational Therapy Session Note  Patient Details  Name: [name] MRN: [id number] Date of Birth: 03-24-1931  Today's Date: 08/22/2016 OT Individual Time: 0904-1000 OT Individual Time Calculation (min): 56 min    Short Term Goals: Week 2:  OT Short Term Goal 1 (Week 2): Pt will complete UB dressing with min assist sitting unsupported.  OT Short Term Goal 2 (Week 2): Pt will complete LB dressing sit to stand with min assist for 2 consecutive sessions. OT Short Term Goal 3 (Week 2): Pt will complete walk-in shower transfers with min assist using the RW.  OT Short Term Goal 4 (Week 2): Pt will maintain standing balance with LB selfcare with min assist and no more than mod instructional cueing for midline orientation.   OT Short Term Goal 5 (Week 2): Pt will complete toilet transfers with min assist to 3:1 with RW.   Skilled Therapeutic Interventions/Progress Updates:    Pt completed bathing, dressing, and grooming during session.  Mod assist with mod instructional cueing for use of the walker with ambulation to the shower bench.  Pt at times getting too far away from the walker and demonstrating increased cervical and trunk flexion as well as demonstrating left lateral pushing.  He completed bathing sit to stand with min assist during session, with use of the grab bar.  Mod assist for transfer out to the wheelchair for dressing and grooming tasks.  He was able to donn all UB and LB clothing with mod assist sit to stand.  Still with increased pushing to the left in standing but less than previous days.  He completed fixing his dentures in wheelchair with setup as well as combing his hair.  Pt left in wheelchair with call button and phone in reach.  Safety belt in place as well.      Therapy Documentation Precautions:  Precautions Precautions: Fall Precaution Comments: left lean in sitting and standing Restrictions Weight Bearing Restrictions: No  Pain: Pain Assessment Pain Assessment:  No/denies pain ADL: ADL ADL Comments: See Functional Tool for details  See Function Navigator for Current Functional Status.   Therapy/Group: Individual Therapy  Samiha Denapoli OTR/L 08/22/2016, 12:16 PM

## 2016-08-22 NOTE — Progress Notes (Signed)
Social Work Lucy Chris, LCSW Social Worker Signed   Patient Care Conference Date of Service: 08/21/2016  1:04 PM      Hide copied text Hover for attribution information Inpatient RehabilitationTeam Conference and Plan of Care Update Date: 08/21/2016   Time: 10:50 AM      Patient Name: [name]      Medical Record Number: [id number]  Date of Birth: 1930/07/29 Sex: Male         Room/Bed: 4W08C/4W08C-01 Payor Info: Payor: MEDICARE / Plan: MEDICARE PART A AND B / Product Type: *No Product type* /     Admitting Diagnosis: L   CVA  Admit Date/Time:  08/12/2016  3:07 PM Admission Comments: No comment available    Primary Diagnosis:  <principal problem not specified> Principal Problem: <principal problem not specified>       Patient Active Problem List    Diagnosis Date Noted  . 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 Hyperlipidemia 08/12/2016  . Hypertensive emergency    . Benign essential HTN    . PAF (paroxysmal atrial fibrillation) (HCC)    . History of CVA (cerebrovascular accident)    . Parkinson disease (HCC)    . Primary osteoarthritis of right knee    . Leukocytosis    . AKI (acute kidney injury) [facility])        Expected Discharge Date: Expected Discharge Date: 09/03/16   Team Members Present: Physician leading conference: Dr. Claudette Laws Social Worker Present: Dossie Der, LCSW Nurse Present: Carmie End, RN PT Present: Wanda Plump, Antonietta Jewel, PT OT Present: Perrin Maltese, OT SLP Present: Fae Pippin, SLP PPS Coordinator present : Tora Duck, RN, CRRN       Current Status/Progress Goal Weekly Team Focus  Medical     Fluid intake improved off IV fluids, still has urinary retention, status post antibiotic treatment for UTI  Maintain renal function, improved bladder function  Med management for bladder dysfunction   Bowel/Bladder       I & O cath if no void 6-8 hours-bladder meds started  Cont B & B       Swallow/Nutrition/ Hydration     Dysphagia 3 with thin liquids  Supervision with least restrictive diet  recall and carryover of compensatory swallow strategies   ADL's     Supervison for UB bathing with mod assist for UB dressing.  Mod assist for LB bathing and dressing, mod assist for functional transfers.   Increased leaning to the left in sitting and standing.  LUE impairment in coordination.    supervison overall with min for dynamic standing balance  selfcare retraining, balance retraining, neuromuscular re-education, LUE coordination, pt/family education   Mobility     min to mod assist overall for transfers; min to max with gait  supervision to min assist overall (min for gait and stairs)  neuro re-ed, balance, postural control, attention to R, coordination, gait, transfers, stairs, strengthening   Communication     Phrase to simple sentence with Mod A to Min A verbal cues for intelligibility  supervision  Not a candidate for RMT, unable to maintain lip seal, increase use of speech intelligibility strategies   Safety/Cognition/ Behavioral Observations   Mod A  Supervision  increase sustained attention, attention to right of midline, basic problem solving   Pain          no pain issues     Skin          monitor  skin-preventative        *See Care Plan and progress notes for long and short-term goals.   Barriers to Discharge: See above     Possible Resolutions to Barriers:  Continue rehabilitation medicine treatment     Discharge Planning/Teaching Needs:  Pt progressing slowly will he reach supervision/min level goals? Son off work for a short time-one month and then wife can not provide much physical care. See how he progresses.      Team Discussion:  Making progress slowly need to extend to reach the goals that were set of min assist level. Increased leaning to the left. Better with his attention. Off IV fluids, but now retaining urine, started on urechloine and flomax. I &  O cath if not voided in 6-8 hours. Wife and son here daily and participating in therapies with pt.  Revisions to Treatment Plan:  DC extended to 3/27 to reach goals    Continued Need for Acute Rehabilitation Level of Care: The patient requires daily medical management by a physician with specialized training in physical medicine and rehabilitation for the following conditions: Daily direction of a multidisciplinary physical rehabilitation program to ensure safe treatment while eliciting the highest outcome that is of practical value to the patient.: Yes Daily medical management of patient stability for increased activity during participation in an intensive rehabilitation regime.: Yes Daily analysis of laboratory values and/or radiology reports with any subsequent need for medication adjustment of medical intervention for : Neurological problems;Urological problems   Lucy ChrisDupree, Rasheen Schewe G 08/22/2016, 8:38 AM      Lucy Chrisebecca G Jahaira Earnhart, LCSW Social Worker Signed   Patient Care Conference Date of Service: 08/14/2016  4:04 PM      Hide copied text Hover for attribution information Inpatient RehabilitationTeam Conference and Plan of Care Update Date: 08/14/2016   Time: 10:45 AM   Patient Name: [name]      Medical Record Number: [id number]  Date of Birth: Apr 12, 1931 Sex: Male         Room/Bed: 4W08C/4W08C-01 Payor Info: Payor: MEDICARE / Plan: MEDICARE PART A AND B / Product Type: *No Product type* /     Admitting Diagnosis: L   CVA  Admit Date/Time:  08/12/2016  3:07 PM Admission Comments: No comment available    Primary Diagnosis:  <principal problem not specified> Principal Problem: <principal problem not specified>       Patient Active Problem List    Diagnosis Date Noted  . 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 Hyperlipidemia 08/12/2016  . Hypertensive emergency    . Benign essential HTN    . PAF (paroxysmal atrial fibrillation) (HCC)    . History of CVA  (cerebrovascular accident)    . Parkinson disease (HCC)    . Primary osteoarthritis of right knee    . Leukocytosis    . AKI (acute kidney injury) [facility](HCC)        Expected Discharge Date: Expected Discharge Date: 08/29/16   Team Members Present: Physician leading conference: Dr. Claudette LawsAndrew Kirsteins Social Worker Present: Dossie DerBecky Vincen Bejar, LCSW PT Present: Wanda Plumparoline Cook, Antonietta JewelPT;Austin Tucker, PT OT Present: Perrin MalteseJames McGuire, OT SLP Present: Reuel DerbyHappi Overton, SLP PPS Coordinator present : Tora DuckMarie Noel, RN, CRRN       Current Status/Progress Goal Weekly Team Focus  Medical     dysphagia goals, decreased attn, intelligibility reduced, sleeps ok  resolve renal failure, resolve UTI  IV hydration med management   Bowel/Bladder     Continent of bowel, incontinent  of bladder, LBM 08/11/16  continent of bowel & bladder  Monitor for patterns for individualized toileting schedule   Swallow/Nutrition/ Hydration     Dys.3 textures and thin liquids   least restrictive PO with Supervision   awareness of oral residue, recall and carryover of comepnsatory strategies to maximize safety with PO   ADL's     Min assist for UB bathing with max assist for dressing.  Total +2 (pt 30%) for LB selfcare sit to stand.  Increased pushing to the left in sitting and with standing.  Slight decreased coordination in the LUE but uses functionally at a diminshed level.  Decreased visual scanning and occular ROM.   supervison overall with min for dynamic standing balance  selfcare retraining, balance retraining, neuromuscular re-education, LUE coordination, therapeutic exercise, pt/family education   Mobility     Mod-max assist with transfer using RW. Gait with mod-max assist with RW due to pushing to the L. poor awareness of the LLE with gait and transfer.   Supervision min       Communication     Mostly single word responses with a few phrases with Max assist for intelligibility  Supervision   increase verbal responses and determine  canidacy for RMT   Safety/Cognition/ Behavioral Observations   Max assist   Supervision   increase sustained attention, which will help all higher level cognitive functions   Pain     no c/o pain since admission, has tylenol 650 Q4hr prn   pain scale less than 3  Assess & treat as needed   Skin     various bruises, no skin breakdown  no new areas of skin breakdown  assess skin q shift     *See Care Plan and progress notes for long and short-term goals.   Barriers to Discharge: see above     Possible Resolutions to Barriers:  see above     Discharge Planning/Teaching Needs:  Home with wife who can provide supervision and son who is currently on a leave from work to assist Mom with Dad's care.      Team Discussion:  Medical issues with UTI and acute renal failure-starting IV fluids and antibiotics. Making progress toward supervision to min assist level goals. Endurance poor last two days probably due to UTI. Wife and son to provide care to at home.  Revisions to Treatment Plan:  DC 3/22    Continued Need for Acute Rehabilitation Level of Care: The patient requires daily medical management by a physician with specialized training in physical medicine and rehabilitation for the following conditions: Daily direction of a multidisciplinary physical rehabilitation program to ensure safe treatment while eliciting the highest outcome that is of practical value to the patient.: Yes Daily medical management of patient stability for increased activity during participation in an intensive rehabilitation regime.: Yes Daily analysis of laboratory values and/or radiology reports with any subsequent need for medication adjustment of medical intervention for : Neurological problems;Renal problems   Lucy Chris 08/14/2016, 4:04 PM       Patient ID: Lennie Vasco, male   DOB: 05-30-1931, 83 y.o.   MRN: [id number]

## 2016-08-22 NOTE — Progress Notes (Signed)
Physical Therapy Session Note  Patient Details  Name: [name] MRN: [id number] Date of Birth: Dec 30, 1930  Today's Date: 08/22/2016 PT Individual Time: 0800-0901 PT Individual Time Calculation (min): 61 min   Short Term Goals: Week 1:  PT Short Term Goal 1 (Week 1): Pt will maintain midline orientation in sitting x5 minutes with min cues PT Short Term Goal 2 (Week 1): Pt will self propel w/c x100' with min assist PT Short Term Goal 3 (Week 1): Pt will negotiate 4 steps with 1 assist   Skilled Therapeutic Interventions/Progress Updates:   Pt received supine in bed and agreeable to PT. Supine>sit transfer with supervision assist, heavy use of bed rails and instruction for posture and decreased pushing to L.   PT provided supervision assist for sitting balance while patient donned pants and shoes sitting EOB, min assist for balance while pt pulled pants to waist.   Stand pivot transfer to [facility] with min assist from PT with UE support on RW and min cues for posture and step pattern.   PT assessed progress towards WC goal; able to perform WC mobility with supervision assist using BLE propulsion technique.   Gait with RW x 133f with min assist fading to Mod assist for last 171fand in last 2 turns. Addition Gait with 1 inch shoe lift on the L LE.   Stairs x 12 with BUE support and min assist from PT with moderate cues for proper step to gait pattern and improved awareness of the LUE.   Kinetron seated 4 bouts x 45 seconds and stand 2 bouts x 1 min. Min assist from PT with moderate-max multimodal instruction for posture and cadence.   [facility] to [facility] and [facility] and all needs met.           Therapy Documentation Precautions:  Precautions Precautions: Fall Precaution Comments: left lean in sitting and standing Restrictions Weight Bearing Restrictions: No    Pain: 0/10   See Function Navigator for Current Functional  Status.   Therapy/Group: Individual Therapy  AuLorie Phenix/15/2018, 11:04 AM

## 2016-08-22 NOTE — Progress Notes (Signed)
Subjective/Complaints:  Seen in therapy, working on standing with PT, month, discussed discharge date. He is aware of it  Review of systems denies any pain with urination. No problems with his breathing. No joint pains. No chest pain  Objective: Vital Signs: Blood pressure 129/76, pulse 71, temperature 97.8 F (36.6 C), temperature source Oral, resp. rate 16, height 5\' 9"  (1.753 m), weight 80.3 kg (177 lb 0.5 oz), SpO2 96 %. No results found. No results found for this or any previous visit (from the past 72 hour(s)).   HEENT: Poor dentition, upper earlobe with dermatitis Cardio: RRR and No murmur Resp: CTA B/L and Unlabored GI: BS positive and Nontender, nondistended Extremity:  No Edema Skin:   Other Dry with poor toenail care Neuro: Lethargic, Abnormal Sensory Withdraws to pinch. Bilateral upper and lower limbs, further assessment not possible due to mental status and Abnormal Motor Patient does move antigravity. 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 No pain with passive range of motion Gen. no acute distress   Assessment/Plan: 1. 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 Physiatrist is providing close team supervision and 24 hour management of active medical problems listed below. Physiatrist and rehab team continue to assess barriers to discharge/monitor patient progress toward functional and medical goals. FIM: Function - Bathing Position: Wheelchair/chair at sink Body parts bathed by patient: Right arm, Left arm, Chest, Abdomen, Right upper leg, Left upper leg, Right lower leg, Left lower leg Body parts bathed by helper: Front perineal area, Buttocks Bathing not applicable: Chest, Abdomen, Right upper  leg, Left upper leg, Right lower leg, Left lower leg Assist Level: Touching or steadying assistance(Pt > 75%)  Function- Upper Body Dressing/Undressing What is the patient wearing?: Pull over shirt/dress Pull over shirt/dress - Perfomed by patient: Thread/unthread right sleeve, Thread/unthread left sleeve Pull over shirt/dress - Perfomed by helper: Put head through opening, Pull shirt over trunk Button up shirt - Perfomed by patient: Thread/unthread left sleeve, Thread/unthread right sleeve Button up shirt - Perfomed by helper: Thread/unthread right sleeve, Thread/unthread left sleeve, Pull shirt around back, Button/unbutton shirt Assist Level: Supervision or verbal cues Function - Lower Body Dressing/Undressing What is the patient wearing?: Pants, Socks, Shoes, Non-skid slipper socks Position: Wheelchair/chair at sink Underwear - Performed by patient: Thread/unthread right underwear leg, Thread/unthread left underwear leg, Pull underwear up/down Pants- Performed by patient: Thread/unthread right pants leg, Thread/unthread left pants leg Pants- Performed by helper: Thread/unthread right pants leg, Thread/unthread left pants leg, Pull pants up/down, Fasten/unfasten pants (therapist assist secondary to decreased time) Non-skid slipper socks- Performed by helper: Don/doff right sock Socks - Performed by patient: Don/doff left sock Socks - Performed by helper: Don/doff right sock, Don/doff left sock (therapist assist secondary to decreased time) Shoes - Performed by patient: Don/doff right shoe, Don/doff left shoe, Fasten right, Fasten left Shoes - Performed by helper: Don/doff right shoe, Don/doff left shoe, Fasten right, Fasten left (therapist assist secondary to decreased time) Assist for footwear: Supervision/touching assist Assist for lower body dressing: Touching or steadying assistance (Pt > 75%)  Function - Toileting Toileting activity did not occur: Safety/medical concerns Toileting  steps completed by patient: Adjust clothing prior to toileting Toileting steps completed by helper: Performs perineal hygiene Toileting Assistive Devices: Other (comment) Assist level:  Touching or steadying assistance (Pt.75%)  Function - ArchivistToilet Transfers Toilet transfer activity did not occur: Safety/medical concerns Toilet transfer assistive device: Grab bar Assist level to toilet: Moderate assist (Pt 50 - 74%/lift or lower) Assist level from toilet: Moderate assist (Pt 50 - 74%/lift or lower)  Function - Chair/bed transfer Chair/bed transfer activity did not occur: Safety/medical concerns Chair/bed transfer method: Stand pivot Chair/bed transfer assist level: Moderate assist (Pt 50 - 74%/lift or lower) Chair/bed transfer assistive device: Walker Chair/bed transfer details: Manual facilitation for weight shifting, Verbal cues for precautions/safety  Function - Locomotion: Wheelchair Type: Manual Max wheelchair distance: 150 Assist Level: Supervision or verbal cues Assist Level: Supervision or verbal cues Assist Level: Supervision or verbal cues Turns around,maneuvers to table,bed, and toilet,negotiates 3% grade,maneuvers on rugs and over doorsills: No Function - Locomotion: Ambulation Assistive device: Walker-rolling Max distance: 50 Assist level: Moderate assist (Pt 50 - 74%) Assist level: Moderate assist (Pt 50 - 74%) Assist level: Moderate assist (Pt 50 - 74%) Walk 150 feet activity did not occur: Safety/medical concerns Walk 10 feet on uneven surfaces activity did not occur: Safety/medical concerns  Function - Comprehension Comprehension: Auditory Comprehension assist level: Understands basic 90% of the time/cues < 10% of the time  Function - Expression Expression: Verbal Expression assist level: Expresses basic 90% of the time/requires cueing < 10% of the time.  Function - Social Interaction Social Interaction assist level: Interacts appropriately 90% of the time -  Needs monitoring or encouragement for participation or interaction.  Function - Problem Solving Problem solving assist level: Solves basic 90% of the time/requires cueing < 10% of the time  Function - Memory Memory assist level: Recognizes or recalls 75 - 89% of the time/requires cueing 10 - 24% of the time Patient normally able to recall (first 3 days only): Current season, Location of own room, Staff names and faces, That he or she is in a hospital  Medical Problem List and Plan: 1.  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  Cont CIR PT, OT, SLP,2.  DVT Prophylaxis/Anticoagulation: Pharmaceutical: Lovenox 3. Pain Management: Tylenol prn for knee pain 4. Mood: LCSW to follow for evaluation and support.  5. Neuropsych: This patient is not fully capable of making decisions on his own behalf. 6. Skin/Wound Care: routine pressure relief  7. Fluids/Electrolytes/Nutrition: Monitor I/O.  repeat BUN/Cr normalized  d/c IVF 8. PAF: Monitor HR bid. Occasionally elevated, question if some is exercise related. Resting heart rate is normal at present  Vitals:   08/21/16 1356 08/22/16 0500  BP: 110/71 129/76  Pulse: (!) 125 71  Resp: 18 16  Temp: 98.6 F (37 C) 97.8 F (36.6 C)   9. HTN: Monitor BP. Cont meds, , increase Zebeta to 10mg   10. Knee OA: Meds as necessary 11. Parkinson's disease: Cont to monitor 12. Leukocytosis , UA positive, UTI day 7/7 abx, We will repeat UA. If patient becomes symptomatic                                                                                            13.  Hypokalemia likely related to IVF,resolved will D/C KCL and repeat BMET in am LOS (Days) 10 A FACE TO FACE EVALUATION WAS PERFORMED  KIRSTEINS,ANDREW E 08/22/2016, 8:54 AM

## 2016-08-22 NOTE — Progress Notes (Signed)
Physical Therapy Weekly Progress Note  Patient Details  Name: [name] MRN: [id number] Date of Birth: June 06, 1931  Beginning of progress report period: 08/13/16 End of progress report period: 08/22/16  Today's Date: 08/22/2016 PT Individual Time: 4403-4742 PT Individual Time Calculation (min): 31 min   Patient has met 3 of 3 short term goals.   Patient continues to demonstrate the following deficits muscle joint tightness, impaired timing and sequencing, unbalanced muscle activation and decreased coordination, decreased visual motor skills and field cut and decreased standing balance, decreased postural control, hemiplegia and decreased balance strategies and therefore will continue to benefit from skilled PT intervention to increase functional independence with mobility.  Patient progressing toward long term goals..  Continue plan of care.  PT Short Term Goals Week 1:  PT Short Term Goal 1 (Week 1): Pt will maintain midline orientation in sitting x5 minutes with min cues PT Short Term Goal 1 - Progress (Week 1): Met PT Short Term Goal 2 (Week 1): Pt will self propel w/c x100' with min assist PT Short Term Goal 2 - Progress (Week 1): Met PT Short Term Goal 3 (Week 1): Pt will negotiate 4 steps with 1 assist  PT Short Term Goal 3 - Progress (Week 1): Met Week 2:  PT Short Term Goal 1 (Week 2): pt will consistently transfer w/c>< mat/bed with supervision to R PT Short Term Goal 2 (Week 2): pt will perform gait with LRAD x 150' with 150' with min/mod assist PT Short Term Goal 3 (Week 2): pt will perform transiitonal backwards/sideways stepping x 5' in order to back up and turn to sit, without LOB 50% of attempts PT Short Term Goal 4 (Week 2): pt will ascend/descend 2 steps no rail with LRAD mod assist PT Short Term Goal 5 (Week 2): pt will stand x 3 minutes during functional reaching activity to the L, with 3 or less LOB  Skilled Therapeutic Interventions/Progress Updates:  w/c  propulsion x 150' using bil LEs with supervision for obstacles on R.  neuromuscular re-education via demo, multimodal cues for standing with bil UE support for 10 x 1 each mini squats, heel raises, toe raises.  With toe raises, pt had LOB backwards x 3/10 trials, with jerking motion as he became aware of LOB balance, but requiring max assist to regain balance.  Seated 15 x 1 each toe raises, heel raises; instructed pt and family in seated exs.  Gait using rolling bedside table, bil hand support, on carpet x 50' including turns, with improved midline orientation. With backing up stepping in order to sit in w/c, pt had LOB requiring assist to prevent fall backwards.  Pt left resting in w/c with quick release belt applied and all needs within reach. Family present.    Therapy Documentation Precautions:  Precautions Precautions: Fall Precaution Comments: left lean in sitting and standing Restrictions Weight Bearing Restrictions: No    Pain: no c/o     Other Treatments: Treatments Neuromuscular Facilitation: Left;Lower Extremity;Upper Extremity;Forced use;Activity to increase motor control;Activity to increase grading;Activity to increase timing and sequencing;Activity to increase sustained activation;Activity to increase lateral weight shifting;Activity to increase anterior-posterior weight shifting Weight Bearing Technique Weight Bearing Technique: Yes RUE Weight Bearing Technique: Extended arm standing LUE Weight Bearing Technique: Extended arm standing Response to Weight Bearing Technique: good functional use of L hand to steer rollng table   See Function Navigator for Current Functional Status.  Therapy/Group: Individual Therapy  Lavone Weisel 08/22/2016, 4:31 PM

## 2016-08-23 ENCOUNTER — Inpatient Hospital Stay (HOSPITAL_COMMUNITY): Payer: Medicare Other | Admitting: Speech Pathology

## 2016-08-23 ENCOUNTER — Inpatient Hospital Stay (HOSPITAL_COMMUNITY): Payer: Medicare Other | Admitting: Occupational Therapy

## 2016-08-23 ENCOUNTER — Inpatient Hospital Stay (HOSPITAL_COMMUNITY): Payer: Medicare Other | Admitting: Physical Therapy

## 2016-08-23 LAB — BASIC METABOLIC PANEL
ANION GAP: 5 (ref 5–15)
BUN: 16 mg/dL (ref 6–20)
CALCIUM: 9.2 mg/dL (ref 8.9–10.3)
CHLORIDE: 105 mmol/L (ref 101–111)
CO2: 28 mmol/L (ref 22–32)
Creatinine, Ser: 1.44 mg/dL — ABNORMAL HIGH (ref 0.61–1.24)
GFR calc non Af Amer: 43 mL/min — ABNORMAL LOW (ref >60)
GFR, EST AFRICAN AMERICAN: 50 mL/min — AB (ref >60)
GLUCOSE: 89 mg/dL (ref 65–99)
Potassium: 4.9 mmol/L (ref 3.5–5.1)
Sodium: 138 mmol/L (ref 135–145)

## 2016-08-23 MED ORDER — BETHANECHOL CHLORIDE 10 MG PO TABS
10.0000 mg | ORAL_TABLET | Freq: Three times a day (TID) | ORAL | Status: DC
  Administered 2016-08-23 – 2016-08-27 (×12): 10 mg via ORAL
  Filled 2016-08-23 (×12): qty 1

## 2016-08-23 NOTE — Progress Notes (Signed)
Physical Therapy Session Note  Patient Details  Name: [name] MRN: [id number] Date of Birth: 09-29-1930  Today's Date: 08/23/2016 PT Individual Time: 0800-0915 PT Individual Time Calculation (min): 75 min   Short Term Goals: Week 2:  PT Short Term Goal 1 (Week 2): pt will consistently transfer w/c>< mat/bed with supervision to R PT Short Term Goal 2 (Week 2): pt will perform gait with LRAD x 150' with 150' with min/mod assist PT Short Term Goal 3 (Week 2): pt will perform transiitonal backwards/sideways stepping x 5' in order to back up and turn to sit, without LOB 50% of attempts PT Short Term Goal 4 (Week 2): pt will ascend/descend 2 steps no rail with LRAD mod assist PT Short Term Goal 5 (Week 2): pt will stand x 3 minutes during functional reaching activity to the L, with 3 or less LOB  Skilled Therapeutic Interventions/Progress Updates:   Pt received sitting on toilet and agreeable to PT. Sit<>stand fopr patient to perform perineal hygiene and clothing management with supervision assist from PT with UE support on Rail in bathroom.   WC mobility x 194f with supervision using all 4 extremities. Supervision assist from PT with min instruction for increased use of the LUE and awareness of obstacles on the R.   Gait training x 1584fwith min assist for straight path and mod assist for all turns to the R. Moderate verbal instruction from PT for increased step width on the LLE to prevent narrow BOS and reduce pushing to the L.   Stand balance on wedge to play connect four x 4 bouts( 2 with L UE and 2with RUE), PT required to provide min assist fading to supervision assist with multimodal cues for increased use of hip strategy to prevent posterior LOB. Pt demonstrates poor use of hip strategy and delayed ankle strategy with Lateral or posterior LOB. Lateral reaching to L followed by anterior/lateral reach to R to grab and place horse shoe on basketball goal. Supervision assist with  tactile cues for imrpoved weight shift to the R.   Dynamic standing balance to take small step to target on R and L x 15 BLE. Min-mod assist from PT with stepping with the LLE to prevent Lateral LOB. Side stepping at Rail in hall x 15 ft to L and R with min assist and moderate cues for improved weight shift to the R.   [facility] returned to [facility] and [facility] and all needs met.         Therapy Documentation Precautions:  Precautions Precautions: Fall Precaution Comments: left lean in sitting and standing Restrictions Weight Bearing Restrictions: No Vital Signs: Therapy Vitals Temp: 98.6 F (37 C) Temp Source: Oral Pulse Rate: 68 Resp: 18 BP: 122/62 Patient Position (if appropriate): Lying Oxygen Therapy SpO2: 100 % O2 Device: Not Delivered Pain: 0/10   See Function Navigator for Current Functional Status.   Therapy/Group: Individual Therapy  AuLorie Phenix/16/2018, 9:17 AM

## 2016-08-23 NOTE — Progress Notes (Signed)
Occupational Therapy Session Note  Patient Details  Name: [name] MRN: [id number] Date of Birth: 04/08/1931  Today's Date: 08/23/2016 OT Individual Time: 1107-1206 OT Individual Time Calculation (min): 59 min    Short Term Goals: Week 2:  OT Short Term Goal 1 (Week 2): Pt will complete UB dressing with min assist sitting unsupported.  OT Short Term Goal 2 (Week 2): Pt will complete LB dressing sit to stand with min assist for 2 consecutive sessions. OT Short Term Goal 3 (Week 2): Pt will complete walk-in shower transfers with min assist using the RW.  OT Short Term Goal 4 (Week 2): Pt will maintain standing balance with LB selfcare with min assist and no more than mod instructional cueing for midline orientation.   OT Short Term Goal 5 (Week 2): Pt will complete toilet transfers with min assist to 3:1 with RW.   Skilled Therapeutic Interventions/Progress Updates:    Pt completed toilet transfer and toileting to start the session.  Mod assist for transfer to the toilet with use of the RW for support.  He was able to complete hygiene in sitting with lateral lean to the left but needed min assist for standing balance in order to manage his clothing.  Once he finished, had him ambulate out to the sink to wash his hands with mod assist again.  Transported pt down to the therapy gym for second part of session, with transfer to the therapy mat with min assist stand pivot.  Worked in standing with focus on maintaining anterior pelvic tilt and upright midline posture while engaged in reaching task with the LUE.  Pt needing min assist to maintain balance as well as min facilitation at the left knee and pelvis in order to maintain knee and hip extension.  Pt also needed min assist for facilitation of forward reaching overhead to target.  Finished with work in sitting with pt picking up small shaped animals with the LUE and then placing to container.  Noted motor planning difficulty demonstrating timing  of functional reach with elbow extension and digit extension to place the pieces.  Min facilitation to maintain anterior pelvic tilt while completing reaching tasks.  Finished session with transfer back to the wheelchair with mod assist stand pivot with use of the RW.  Increased posterior lean when standing.  Pt returned to room via wheelchair with call button and phone in reach and spouse and son present.    Therapy Documentation Precautions:  Precautions Precautions: Fall Precaution Comments: left lean in sitting and standing Restrictions Weight Bearing Restrictions: No  Pain: Pain Assessment Pain Assessment: No/denies pain Pain Score: 0-No pain ADL: See Function Navigator for Current Functional Status.   Therapy/Group: Individual Therapy  Johnta Couts OTR/L 08/23/2016, 12:51 PM

## 2016-08-23 NOTE — Progress Notes (Signed)
Speech Language Pathology Daily Session Note  Patient Details  Name: [name] MRN: [id number] Date of Birth: Sep 09, 1930  Today's Date: 08/23/2016 SLP Individual Time: 1400-1500 SLP Individual Time Calculation (min): 60 min  Short Term Goals: Week 2: SLP Short Term Goal 1 (Week 2): Patient will solve basic problems related to self-care with Min assist. SLP Short Term Goal 2 (Week 2): Patient will verbally identify 2 physical and 2 cognitive changes wtih Mod question cues  SLP Short Term Goal 3 (Week 2): Patient will consume Dys.3 textures and thin liquids with Min assist verbal question cues to recall and utilize safe swallow strategeis to manage oral residue and prevent overt s/s of aspiration.   SLP Short Term Goal 4 (Week 2): Patient will sustain attention to familiar tasks for 10-20 minutes with Min assist verbal cues for redirection SLP Short Term Goal 5 (Week 2): Patient will utilize speech intelligibilty strategies at the phrase-sentence level of verbal expression with Mod assist verbal cues   Skilled Therapeutic Interventions: Skilled treatment session focused on cognition goals. SLP facilitated session by providing Max A multimodal cues for sustained attention for ~2 minute intervals and Max A multimodal cues for completion of previously learned task. Pt faced multiple distractions in environment as well as internal distractions (their home security alarm had gone off). Pt was returned to room, left upright in wheelchair, safety belt donned and wife present. Continue per current plan of care.      Function:    Cognition Comprehension Comprehension assist level: Understands basic 90% of the time/cues < 10% of the time  Expression   Expression assist level: Expresses basic 90% of the time/requires cueing < 10% of the time.  Social Interaction Social Interaction assist level: Interacts appropriately 90% of the time - Needs monitoring or encouragement for participation or  interaction.  Problem Solving Problem solving assist level: Solves basic 75 - 89% of the time/requires cueing 10 - 24% of the time  Memory Memory assist level: Recognizes or recalls 75 - 89% of the time/requires cueing 10 - 24% of the time    Pain Pain Assessment Pain Assessment: No/denies pain  Therapy/Group: Individual Therapy   Teran Knittle B. Dreama Saaverton, M.S., CCC-SLP Speech-Language Pathologist   Jerianne Anselmo 08/23/2016, 2:52 PM

## 2016-08-23 NOTE — Progress Notes (Signed)
Subjective/Complaints:  Patient is awake and alert this morning. He feels like he needs to urinate, some abdominal cramping. No bowel movement reported since yesterday  Review of systems denies any pain with urination. No problems with his breathing. No joint pains. No chest pain  Objective: Vital Signs: Blood pressure 122/62, pulse 68, temperature 98.6 F (37 C), temperature source Oral, resp. rate 18, height 5\' 9"  (1.753 m), weight 80.3 kg (177 lb 0.5 oz), SpO2 100 %. No results found. No results found for this or any previous visit (from the past 72 hour(s)).   HEENT: Poor dentition, upper earlobe with dermatitis Cardio: RRR and No murmur Resp: CTA B/L and Unlabored GI: BS positive and Nontender, nondistended Extremity:  No Edema Skin:   Other Dry with poor toenail care Neuro: Lethargic, Abnormal Sensory Withdraws to pinch. Bilateral upper and lower limbs, further assessment not possible due to mental status and Abnormal Motor Patient does move antigravity. 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 No pain with passive range of motion Gen. no acute distress   Assessment/Plan: 1. 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 Physiatrist is providing close team supervision and 24 hour management of active medical problems listed below. Physiatrist and rehab team continue to assess barriers to discharge/monitor patient progress toward functional and medical goals. FIM: Function - Bathing Position: Wheelchair/chair at sink Body parts bathed by patient: Right arm, Left arm, Chest, Abdomen, Right upper leg, Left upper leg, Right lower leg, Left lower leg, Buttocks, Front perineal area Body parts bathed by helper:  Back Bathing not applicable: Chest, Abdomen, Right upper leg, Left upper leg, Right lower leg, Left lower leg Assist Level: Touching or steadying assistance(Pt > 75%)  Function- Upper Body Dressing/Undressing What is the patient wearing?: Pull over shirt/dress Pull over shirt/dress - Perfomed by patient: Thread/unthread right sleeve, Thread/unthread left sleeve Pull over shirt/dress - Perfomed by helper: Put head through opening, Pull shirt over trunk Button up shirt - Perfomed by patient: Thread/unthread left sleeve, Thread/unthread right sleeve Button up shirt - Perfomed by helper: Thread/unthread right sleeve, Thread/unthread left sleeve, Pull shirt around back, Button/unbutton shirt Assist Level: Supervision or verbal cues Function - Lower Body Dressing/Undressing What is the patient wearing?: Pants, Socks, Shoes, Non-skid slipper socks Position: Wheelchair/chair at sink Underwear - Performed by patient: Thread/unthread right underwear leg, Thread/unthread left underwear leg, Pull underwear up/down Pants- Performed by patient: Thread/unthread right pants leg, Thread/unthread left pants leg Pants- Performed by helper: Pull pants up/down Non-skid slipper socks- Performed by helper: Don/doff right sock, Don/doff left sock Socks - Performed by patient: Don/doff left sock Socks - Performed by helper: Don/doff right sock, Don/doff left sock Shoes - Performed by patient: Don/doff right shoe, Don/doff left shoe, Fasten right, Fasten left Shoes - Performed by helper: Don/doff right shoe, Don/doff left shoe, Fasten right, Fasten left (therapist assist secondary to decreased time) Assist for footwear: Supervision/touching assist Assist for lower body dressing: Touching or steadying assistance (Pt > 75%)  Function - Toileting Toileting activity did not occur: Safety/medical concerns Toileting steps completed by patient: Adjust clothing prior to toileting Toileting steps completed by helper:  Performs perineal hygiene, Adjust clothing after toileting Toileting Assistive Devices: Grab bar or rail Assist level: Touching or steadying assistance (Pt.75%)  Function - Archivist transfer activity did not occur: Safety/medical concerns Toilet transfer assistive device: Grab bar Assist level to toilet: Moderate assist (Pt 50 - 74%/lift or lower) Assist level from toilet: Moderate assist (Pt 50 - 74%/lift or lower)  Function - Chair/bed transfer Chair/bed transfer activity did not occur: Safety/medical concerns Chair/bed transfer method: Stand pivot Chair/bed transfer assist level: Touching or steadying assistance (Pt > 75%) Chair/bed transfer assistive device: Armrests, Walker Chair/bed transfer details: Manual facilitation for weight shifting, Verbal cues for precautions/safety  Function - Locomotion: Wheelchair Type: Manual Max wheelchair distance: 100 Assist Level: Supervision or verbal cues Assist Level: Supervision or verbal cues Assist Level: Supervision or verbal cues Turns around,maneuvers to table,bed, and toilet,negotiates 3% grade,maneuvers on rugs and over doorsills: No Function - Locomotion: Ambulation Assistive device: Walker-rolling Max distance: 156ft  Assist level: Moderate assist (Pt 50 - 74%) Assist level: Touching or steadying assistance (Pt > 75%) Assist level: Touching or steadying assistance (Pt > 75%) Walk 150 feet activity did not occur: Safety/medical concerns Assist level: Touching or steadying assistance (Pt > 75%) Walk 10 feet on uneven surfaces activity did not occur: Safety/medical concerns  Function - Comprehension Comprehension: Auditory Comprehension assist level: Understands basic 90% of the time/cues < 10% of the time  Function - Expression Expression: Verbal Expression assist level: Expresses basic 90% of the time/requires cueing < 10% of the time.  Function - Social Interaction Social Interaction assist level: Interacts  appropriately 90% of the time - Needs monitoring or encouragement for participation or interaction.  Function - Problem Solving Problem solving assist level: Solves basic 75 - 89% of the time/requires cueing 10 - 24% of the time  Function - Memory Memory assist level: Recognizes or recalls 75 - 89% of the time/requires cueing 10 - 24% of the time Patient normally able to recall (first 3 days only): Current season, Location of own room, Staff names and faces, That he or she is in a hospital  Medical Problem List and Plan: 1.  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  Cont CIR PT, OT, SLP becoming more cognitively aware ,2.  DVT Prophylaxis/Anticoagulation: Pharmaceutical: Lovenox 3. Pain Management: Tylenol prn for knee pain 4. Mood: LCSW to follow for evaluation and support.  5. Neuropsych: This patient is not fully capable of making decisions on his own behalf. 6. Skin/Wound Care: routine pressure relief  7. Fluids/Electrolytes/Nutrition: Monitor I/O.  repeat BUN/Cr normalized  d/c IVF 8. PAF: Monitor HR bid.  Resting heart rate is normal at present  Vitals:   08/22/16 1428 08/23/16 0558  BP: 120/61 122/62  Pulse: 67 68  Resp: 17 18  Temp: 98.7 F (37.1 C) 98.6 F (37 C)   9. HTN: Monitor BP. Cont meds, , increase Zebeta to 10mg   10. Knee OA: Meds as necessary 11. Parkinson's disease: Cont to monitor 12. Urinary retention, likely multifactorial, continue Flomax, add Urecholine, may be gaining some sensation.  13. Hypokalemia likely related to IVF,resolved will D/C KCL and repeat BMET pnd LOS (Days) 11 A FACE TO FACE EVALUATION WAS PERFORMED  KIRSTEINS,ANDREW E 08/23/2016, 6:11 AM

## 2016-08-24 ENCOUNTER — Inpatient Hospital Stay (HOSPITAL_COMMUNITY): Payer: Medicare Other | Admitting: Occupational Therapy

## 2016-08-24 ENCOUNTER — Inpatient Hospital Stay (HOSPITAL_COMMUNITY): Payer: Medicare Other | Admitting: Physical Therapy

## 2016-08-24 DIAGNOSIS — I10 Essential (primary) hypertension: Secondary | ICD-10-CM

## 2016-08-24 NOTE — Progress Notes (Signed)
Occupational Therapy Session Note  Patient Details  Name: [name] MRN: [id number] Date of Birth: 1930-12-11  Today's Date: 08/24/2016 OT Individual Time: 1452-1540 OT Individual Time Calculation (min): 48 min    Skilled Therapeutic Interventions/Progress Updates: Pt was sitting in w/c at time of arrival with spouse [name] present. Per Coralee NorthNina, pt had not changed clothes since yesterday. Tx focus on adaptive dressing, L UE NMR, and visual field scanning. Had pt self propel over to dresser and use L UE while scanning for clothing items. Pt required questioning cues for retrieving all necessary items. Dressing was then completed w/c level at sink with cues for sequencing, safety with sit<stand transitions, and cues for integrating L UE. Left lean exhibited in standing. Visual biofeedback and tactile cues used to facilitate midline orientation in standing. All sit<stands completed with Min A. At end of session pt was left in w/c with nurse tech for cathing.      Therapy Documentation Precautions:  Precautions Precautions: Fall Precaution Comments: left lean in sitting and standing Restrictions Weight Bearing Restrictions: No General:   Vital Signs: Therapy Vitals Temp: 97.5 F (36.4 C) Temp Source: Oral Pulse Rate: 75 Resp: 16 BP: (!) 107/54 Patient Position (if appropriate): Sitting Oxygen Therapy SpO2: 97 % O2 Device: Not Delivered Pain: No c/o pain during session    ADL: ADL ADL Comments: See Functional Tool for details    See Function Navigator for Current Functional Status.   Therapy/Group: Individual Therapy  Kaisa Wofford A Aiyla Baucom 08/24/2016, 4:15 PM

## 2016-08-24 NOTE — Progress Notes (Signed)
Physical Therapy Session Note  Patient Details  Name: [name] MRN: [id number] Date of Birth: 05-02-1931  Today's Date: 08/24/2016 PT Individual Time: 1630-1700 PT Individual Time Calculation (min): 30 min   Short Term Goals: Week 2:  PT Short Term Goal 1 (Week 2): pt will consistently transfer w/c>< mat/bed with supervision to R PT Short Term Goal 2 (Week 2): pt will perform gait with LRAD x 150' with 150' with min/mod assist PT Short Term Goal 3 (Week 2): pt will perform transiitonal backwards/sideways stepping x 5' in order to back up and turn to sit, without LOB 50% of attempts PT Short Term Goal 4 (Week 2): pt will ascend/descend 2 steps no rail with LRAD mod assist PT Short Term Goal 5 (Week 2): pt will stand x 3 minutes during functional reaching activity to the L, with 3 or less LOB Week 3:     Skilled Therapeutic Interventions/Progress Updates:   WC mobility to rehab gym with supervision assist from PT using BLE propulsion.  Gait with AD x 76f with min assist and mod assist in turns Dynamic balance to weave through 2 cones x 2 with RW overall mod assist from PT to prevent LOB and max cues for proper gait pattern to prevent poor foot placement. Stepping to target with Min assist at upper trunk. X 20 BLE  Gait without RW x 2063f mod-min assist at L upper trunk to improve stability. Pt noted to have improved gait pattern without AD and improved posture with trunk stabilization.   [facility] returned to room and [facility] and all needs met.        Therapy Documentation Precautions:  Precautions Precautions: Fall Precaution Comments: left lean in sitting and standing Restrictions Weight Bearing Restrictions: No Vital Signs: Therapy Vitals Temp: 97.5 F (36.4 C) Temp Source: Oral Pulse Rate: 75 Resp: 16 BP: (!) 107/54 Patient Position (if appropriate): Sitting Oxygen Therapy SpO2: 97 % O2 Device: Not Delivered Pain:   0/10    See Function Navigator for Current Functional Status.   Therapy/Group: Individual Therapy  AuLorie Phenix/17/2018, 5:04 PM

## 2016-08-24 NOTE — Progress Notes (Signed)
[name] is a 83 y.o. male 1930/07/17 [id number]  Subjective: No new complaints. No new problems. Slept well. Feeling OK.  Objective: Vital signs in last 24 hours: Temp:  [97.5 F (36.4 C)] 97.5 F (36.4 C) (03/17 1535) Pulse Rate:  [75] 75 (03/17 1535) Resp:  [16] 16 (03/17 1535) BP: (107)/(54) 107/54 (03/17 1535) SpO2:  [97 %] 97 % (03/17 1535) Weight change:  Last BM Date: 08/23/16  Intake/Output from previous day: 03/16 0701 - 03/17 0700 In: 720 [P.O.:720] Out: 1350 [Urine:1350] Last cbgs: CBG (last 3)  No results for input(s): GLUCAP in the last 72 hours.   Physical Exam General: No apparent distress   HEENT: not dry Lungs: Normal effort. Lungs clear to auscultation, no crackles or wheezes. Cardiovascular: Regular rate and rhythm, no edema Abdomen: S/NT/ND; BS(+) Musculoskeletal:  unchanged Neurological: No new neurological deficits Wounds: N/A    Skin: clear  Aging changes Mental state: Alert, cooperative In a w/c    Lab Results: BMET    Component Value Date/Time   NA 138 08/23/2016 0532   K 4.9 08/23/2016 0532   CL 105 08/23/2016 0532   CO2 28 08/23/2016 0532   GLUCOSE 89 08/23/2016 0532   BUN 16 08/23/2016 0532   CREATININE 1.44 (H) 08/23/2016 0532   CALCIUM 9.2 08/23/2016 0532   GFRNONAA 43 (L) 08/23/2016 0532   GFRAA 50 (L) 08/23/2016 0532   CBC    Component Value Date/Time   WBC 13.0 (H) 08/19/2016 0546   RBC 4.19 (L) 08/19/2016 0546   HGB 13.1 08/19/2016 0546   HCT 38.2 (L) 08/19/2016 0546   PLT 235 08/19/2016 0546   MCV 91.2 08/19/2016 0546   MCH 31.3 08/19/2016 0546   MCHC 34.3 08/19/2016 0546   RDW 13.6 08/19/2016 0546   LYMPHSABS 0.8 08/16/2016 1025   MONOABS 1.3 (H) 08/16/2016 1025   EOSABS 0.3 08/16/2016 1025   BASOSABS 0.0 08/16/2016 1025    Studies/Results: No results found.  Medications: I have reviewed the patient's current medications.  Assessment/Plan:  1. R pons CVA - CIR 2. DVT proph - Lovenox 3. HTN -  Zebeta 4. Parkinson's - monitoring sx's 5. PAF 6. BPH - Flomax                                                                                              Length of stay, days: 12  Sonda PrimesAlex Plotnikov , MD 08/24/2016, 4:18 PM

## 2016-08-24 NOTE — Plan of Care (Signed)
Problem: RH BLADDER ELIMINATION Goal: RH STG MANAGE BLADDER WITH ASSISTANCE STG Manage Bladder With mod Assistance   Outcome: Not Progressing Inability to urinate. Continues In/Out cath. Goal: RH STG MANAGE BLADDER WITH MEDICATION WITH ASSISTANCE STG Manage Bladder With Medication With mod Assistance.   Outcome: Not Progressing Inability to urinate. Continues In/Out cath.

## 2016-08-25 ENCOUNTER — Inpatient Hospital Stay (HOSPITAL_COMMUNITY): Payer: Medicare Other | Admitting: Physical Therapy

## 2016-08-25 DIAGNOSIS — N401 Enlarged prostate with lower urinary tract symptoms: Secondary | ICD-10-CM

## 2016-08-25 DIAGNOSIS — R3916 Straining to void: Secondary | ICD-10-CM

## 2016-08-25 DIAGNOSIS — G20 Parkinson's disease: Secondary | ICD-10-CM

## 2016-08-25 NOTE — Progress Notes (Signed)
Physical Therapy Session Note  Patient Details  Name: [name] MRN: [id number] Date of Birth: 1931-02-11  Today's Date: 08/25/2016 PT Individual Time: 1105-1159 PT Individual Time Calculation (min): 54 min   Short Term Goals: Week 2:  PT Short Term Goal 1 (Week 2): pt will consistently transfer w/c>< mat/bed with supervision to R PT Short Term Goal 2 (Week 2): pt will perform gait with LRAD x 150' with 150' with min/mod assist PT Short Term Goal 3 (Week 2): pt will perform transiitonal backwards/sideways stepping x 5' in order to back up and turn to sit, without LOB 50% of attempts PT Short Term Goal 4 (Week 2): pt will ascend/descend 2 steps no rail with LRAD mod assist PT Short Term Goal 5 (Week 2): pt will stand x 3 minutes during functional reaching activity to the L, with 3 or less LOB  Skilled Therapeutic Interventions/Progress Updates:  Pt received in bathroom with continent BM. Pt performed peri-hygiene with therapist providing additional assistance to ensure cleanliness. Pt transferred toilet>w/c via stand pivot with grab bar & steady assist. Pt performed hand hygiene from w/c level at sink with supervision. Transported pt to gym & pt ambulated 100 ft with RW & min/mod assist 2/2 L/posterior lean, R inattention, and impaired safety awareness & foot placement with RW. Pt engaged in standing task addressing midline orientation, LUE NMR, and R inattention with steady assist/supervision overall for balance. Pt able to grasp cards with LUE but with difficulty letting go. Pt utilized cybex kinetron in standing with BUE & mirror for visual feedback, along with multimodal cuing from therapist. Pt fatigues quickly with task. Pt ambulated back to room with RW & mod assist with therapist providing multimodal cuing for posture/midline orientation and weight shifting. Pt demonstrates decreased weight shifting R therefore decreased step length LLE. At end of session pt left sitting in w/c in room  with QRB donned, all needs within reach, and family present to supervise.   Pt with c/o diplopia but reports this is not new & has been occurring since admission to hospital.   Therapy Documentation Precautions:  Precautions Precautions: Fall Precaution Comments: left lean in sitting and standing Restrictions Weight Bearing Restrictions: No  Pain: No c/o pain.   See Function Navigator for Current Functional Status.   Therapy/Group: Individual Therapy  Sandi MariscalVictoria M Charrise Lardner 08/25/2016, 12:26 PM

## 2016-08-25 NOTE — Plan of Care (Signed)
Problem: RH BLADDER ELIMINATION Goal: RH STG MANAGE BLADDER WITH ASSISTANCE STG Manage Bladder With mod Assistance   Outcome: Not Progressing Inability to urinate. In/Out cath continues every 6-8 hours Goal: RH STG MANAGE BLADDER WITH MEDICATION WITH ASSISTANCE STG Manage Bladder With Medication With mod Assistance.   Outcome: Not Progressing Inability to urinate. In/Out cath continues every 6-8 hours

## 2016-08-25 NOTE — Progress Notes (Signed)
[name] is a [age] y.o. male March 19, 1931 [id number]  Subjective: No new complaints. No new problems. Slept well. Feeling OK.  Objective: Vital signs in last 24 hours: Temp:  [97.8 F (36.6 C)-98.5 F (36.9 C)] 97.8 F (36.6 C) (03/18 1500) Pulse Rate:  [56-82] 56 (03/18 1500) Resp:  [17-18] 17 (03/18 1500) BP: (113-133)/(54-62) 113/54 (03/18 1500) SpO2:  [98 %-100 %] 100 % (03/18 1500) Weight change:  Last BM Date: 08/23/16  Intake/Output from previous day: 03/17 0701 - 03/18 0700 In: 600 [P.O.:600] Out: 972 [Urine:972] Last cbgs: CBG (last 3)  No results for input(s): GLUCAP in the last 72 hours.   Physical Exam General: No apparent distress   HEENT: not dry Lungs: Normal effort. Lungs clear to auscultation, no crackles or wheezes. Cardiovascular: Regular rate and rhythm, no edema Abdomen: S/NT/ND; BS(+) Musculoskeletal:  unchanged Neurological: No new neurological deficits Wounds: N/A    Skin: clear  Aging changes Mental state: Alert, cooperative In a w/c    Lab Results: BMET    Component Value Date/Time   NA 138 08/23/2016 0532   K 4.9 08/23/2016 0532   CL 105 08/23/2016 0532   CO2 28 08/23/2016 0532   GLUCOSE 89 08/23/2016 0532   BUN 16 08/23/2016 0532   CREATININE 1.44 (H) 08/23/2016 0532   CALCIUM 9.2 08/23/2016 0532   GFRNONAA 43 (L) 08/23/2016 0532   GFRAA 50 (L) 08/23/2016 0532   CBC    Component Value Date/Time   WBC 13.0 (H) 08/19/2016 0546   RBC 4.19 (L) 08/19/2016 0546   HGB 13.1 08/19/2016 0546   HCT 38.2 (L) 08/19/2016 0546   PLT 235 08/19/2016 0546   MCV 91.2 08/19/2016 0546   MCH 31.3 08/19/2016 0546   MCHC 34.3 08/19/2016 0546   RDW 13.6 08/19/2016 0546   LYMPHSABS 0.8 08/16/2016 1025   MONOABS 1.3 (H) 08/16/2016 1025   EOSABS 0.3 08/16/2016 1025   BASOSABS 0.0 08/16/2016 1025    Studies/Results: No results found.  Medications: I have reviewed the patient's current medications.  Assessment/Plan:  1. Pons CVA R ---  CIR 2. DVT proph w/Lovenox 3. HTN - on Zebeta 4. Parkinsons 5. PAF 6. BPH - on Flomax    Length of stay, days: 13  Sonda PrimesAlex Dontavius Mckee , MD 08/25/2016, 9:28 PM

## 2016-08-26 ENCOUNTER — Inpatient Hospital Stay (HOSPITAL_COMMUNITY): Payer: Medicare Other | Admitting: Occupational Therapy

## 2016-08-26 ENCOUNTER — Inpatient Hospital Stay (HOSPITAL_COMMUNITY): Payer: Medicare Other | Admitting: Speech Pathology

## 2016-08-26 ENCOUNTER — Inpatient Hospital Stay (HOSPITAL_COMMUNITY): Payer: Medicare Other

## 2016-08-26 LAB — BASIC METABOLIC PANEL
Anion gap: 10 (ref 5–15)
BUN: 20 mg/dL (ref 6–20)
CALCIUM: 8.8 mg/dL — AB (ref 8.9–10.3)
CO2: 25 mmol/L (ref 22–32)
Chloride: 101 mmol/L (ref 101–111)
Creatinine, Ser: 1.65 mg/dL — ABNORMAL HIGH (ref 0.61–1.24)
GFR calc Af Amer: 43 mL/min — ABNORMAL LOW (ref >60)
GFR, EST NON AFRICAN AMERICAN: 37 mL/min — AB (ref >60)
GLUCOSE: 99 mg/dL (ref 65–99)
Potassium: 4.4 mmol/L (ref 3.5–5.1)
SODIUM: 136 mmol/L (ref 135–145)

## 2016-08-26 LAB — CBC
HCT: 38.1 % — ABNORMAL LOW (ref 39.0–52.0)
Hemoglobin: 12.8 g/dL — ABNORMAL LOW (ref 13.0–17.0)
MCH: 31.2 pg (ref 26.0–34.0)
MCHC: 33.6 g/dL (ref 30.0–36.0)
MCV: 92.9 fL (ref 78.0–100.0)
PLATELETS: 256 10*3/uL (ref 150–400)
RBC: 4.1 MIL/uL — ABNORMAL LOW (ref 4.22–5.81)
RDW: 13.8 % (ref 11.5–15.5)
WBC: 17 10*3/uL — AB (ref 4.0–10.5)

## 2016-08-26 NOTE — Progress Notes (Signed)
Subjective/Complaints:  Pt remembers me, states that his wife can assist him at home  Review of systems denies any pain with urination. No problems with his breathing. No joint pains. No chest pain  Objective: Vital Signs: Blood pressure 129/77, pulse 85, temperature 98.4 F (36.9 C), temperature source Oral, resp. rate 18, height _0  (1.753 m), weight 80.3 kg (177 lb 0.5 oz), SpO2 100 %. No results found. Results for orders placed or performed during the hospital encounter of 08/12/16 (from the past 72 hour(s))  CBC     Status: Abnormal   Collection Time: 08/26/16  5:43 AM  Result Value Ref Range   WBC 17.0 (H) 4.0 - 10.5 K/uL   RBC 4.10 (L) 4.22 - 5.81 MIL/uL   Hemoglobin 12.8 (L) 13.0 - 17.0 g/dL   HCT 38.1 (L) 39.0 - 52.0 %   MCV 92.9 78.0 - 100.0 fL   MCH 31.2 26.0 - 34.0 pg   MCHC 33.6 30.0 - 36.0 g/dL   RDW 13.8 11.5 - 15.5 %   Platelets 256 150 - 400 K/uL  Basic metabolic panel     Status: Abnormal   Collection Time: 08/26/16  5:43 AM  Result Value Ref Range   Sodium 136 135 - 145 mmol/L   Potassium 4.4 3.5 - 5.1 mmol/L   Chloride 101 101 - 111 mmol/L   CO2 25 22 - 32 mmol/L   Glucose, Bld 99 65 - 99 mg/dL   BUN 20 6 - 20 mg/dL   Creatinine, Ser 1.65 (H) 0.61 - 1.24 mg/dL   Calcium 8.8 (L) 8.9 - 10.3 mg/dL   GFR calc non Af Amer 37 (L) >60 mL/min   GFR calc Af Amer 43 (L) >60 mL/min    Comment: (NOTE) The eGFR has been calculated using the CKD EPI equation. This calculation has not been validated in all clinical situations. eGFR's persistently <60 mL/min signify possible Chronic Kidney Disease.    Anion gap 10 5 - 15     HEENT: Poor dentition, upper earlobe with dermatitis Cardio: RRR and No murmur Resp: CTA B/L and Unlabored GI: BS positive and Nontender, nondistended Extremity:  No Edema Skin:   Other Dry with poor toenail care Neuro: Lethargic, Abnormal Sensory Withdraws to pinch. Bilateral upper and lower limbs, further assessment not possible due  to mental status and Abnormal Motor Patient does move antigravity. 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 No pain with passive range of motion Gen. no acute distress   Assessment/Plan: 1. 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 Physiatrist is providing close team supervision and 24 hour management of active medical problems listed below. Physiatrist and rehab team continue to assess barriers to discharge/monitor patient progress toward functional and medical goals. FIM: Function - Bathing Position: Wheelchair/chair at sink Body parts bathed by patient: Right arm, Left arm, Chest, Abdomen, Right upper leg, Left upper leg, Right lower leg, Left lower leg, Buttocks, Front perineal area Body parts bathed by helper: Back Bathing not applicable: Chest, Abdomen, Right upper leg, Left upper leg, Right lower leg, Left lower leg Assist Level: Touching or steadying assistance(Pt > 75%)  Function- Upper Body Dressing/Undressing What is the patient wearing?: Button up shirt Pull over shirt/dress - Perfomed by patient: Thread/unthread  right sleeve, Thread/unthread left sleeve Pull over shirt/dress - Perfomed by helper: Put head through opening, Pull shirt over trunk Button up shirt - Perfomed by patient: Thread/unthread left sleeve, Thread/unthread right sleeve, Pull shirt around back Button up shirt - Perfomed by helper: Button/unbutton shirt (He as able to complete 6/7 buttons) Assist Level: Supervision or verbal cues Function - Lower Body Dressing/Undressing What is the patient wearing?: Pants, Socks, Shoes Position: Wheelchair/chair at sink Underwear - Performed by patient: Thread/unthread right underwear leg, Thread/unthread  left underwear leg, Pull underwear up/down Pants- Performed by patient: Thread/unthread right pants leg, Thread/unthread left pants leg Pants- Performed by helper: Pull pants up/down Non-skid slipper socks- Performed by helper: Don/doff right sock, Don/doff left sock Socks - Performed by patient: Don/doff left sock, Don/doff right sock Socks - Performed by helper: Don/doff right sock, Don/doff left sock Shoes - Performed by patient: Don/doff right shoe, Don/doff left shoe, Fasten right, Fasten left Shoes - Performed by helper: Don/doff right shoe, Don/doff left shoe, Fasten right, Fasten left (therapist assist secondary to decreased time) Assist for footwear: Supervision/touching assist Assist for lower body dressing: Touching or steadying assistance (Pt > 75%)  Function - Toileting Toileting activity did not occur: Safety/medical concerns Toileting steps completed by patient: Performs perineal hygiene Toileting steps completed by helper: Adjust clothing after toileting, Adjust clothing prior to toileting Toileting Assistive Devices: Grab bar or rail Assist level: Touching or steadying assistance (Pt.75%)  Function - Air cabin crew transfer activity did not occur: Safety/medical concerns Toilet transfer assistive device: Grab bar Assist level to toilet: Moderate assist (Pt 50 - 74%/lift or lower) Assist level from toilet: Moderate assist (Pt 50 - 74%/lift or lower)  Function - Chair/bed transfer Chair/bed transfer activity did not occur: Safety/medical concerns Chair/bed transfer method: Stand pivot Chair/bed transfer assist level: Touching or steadying assistance (Pt > 75%) Chair/bed transfer assistive device: Armrests, Walker Chair/bed transfer details: Manual facilitation for weight shifting, Verbal cues for precautions/safety  Function - Locomotion: Wheelchair Type: Manual Max wheelchair distance: 13f  Assist Level: Supervision or verbal cues Assist Level:  Supervision or verbal cues Assist Level: Supervision or verbal cues Turns around,maneuvers to table,bed, and toilet,negotiates 3% grade,maneuvers on rugs and over doorsills: No Function - Locomotion: Ambulation Assistive device: Walker-rolling Max distance: 150 ft Assist level: Moderate assist (Pt 50 - 74%) Assist level: Moderate assist (Pt 50 - 74%) Assist level: Moderate assist (Pt 50 - 74%) Walk 150 feet activity did not occur: Safety/medical concerns Assist level: Moderate assist (Pt 50 - 74%) Walk 10 feet on uneven surfaces activity did not occur: Safety/medical concerns  Function - Comprehension Comprehension: Auditory Comprehension assist level: Understands basic 90% of the time/cues < 10% of the time  Function - Expression Expression: Verbal Expression assist level: Expresses basic 90% of the time/requires cueing < 10% of the time.  Function - Social Interaction Social Interaction assist level: Interacts appropriately 90% of the time - Needs monitoring or encouragement for participation or interaction.  Function - Problem Solving Problem solving assist level: Solves basic 75 - 89% of the time/requires cueing 10 - 24% of the time  Function - Memory Memory assist level: Recognizes or recalls 75 - 89% of the time/requires cueing 10 - 24% of the time Patient normally able to recall (first 3 days only): Current season, Location of own room, Staff names and faces, That he or she is in a hospital  Medical Problem List and Plan: 1.  Poor activity tolerance, balance deficits secondary to right  pons hemorrhage with history of CVA.  Cont CIR PT, OT, SLP pt progressing ,2.  DVT Prophylaxis/Anticoagulation: Pharmaceutical: Lovenox 3. Pain Management: Tylenol prn for knee pain 4. Mood: LCSW to follow for evaluation and support.  5. Neuropsych: This patient is not fully capable of making decisions on his own behalf. 6. Skin/Wound Care: routine pressure relief  7.  Fluids/Electrolytes/Nutrition: Monitor I/O.  repeat BUN/Cr normalized  d/c IVF 8. PAF: Monitor HR bid.  Resting heart rate is normal at present  Vitals:   08/25/16 1500 08/26/16 0515  BP: (!) 113/54 129/77  Pulse: (!) 56 85  Resp: 17 18  Temp: 97.8 F (36.6 C) 98.4 F (36.9 C)   9. HTN: Monitor BP. Cont meds, ,cont Zebeta 66m  10. Knee OA: Meds as necessary 11. Parkinson's disease: Cont to monitor 12. Urinary retention, likely multifactorial, continue Flomax, add Urecholine, may be gaining some sensation.                                                                                            13. Hypokalemia likely related to IVF,resolved will D/C KCL and repeat BMET shows some elevation of creat, monitor, may need IVF if Creat cont to rise, enc fluids, no nephrotoxic meds LOS (Days) 14 A FACE TO FACE EVALUATION WAS PERFORMED  Hendrix Yurkovich E 08/26/2016, 8:42 AM

## 2016-08-26 NOTE — Progress Notes (Signed)
Occupational Therapy Session Note  Patient Details  Name: Para Skeanslbert Rybka MRN: [id number] Date of Birth: [birth year]  Today's Date: 08/26/2016 OT Individual Time: 1111-1209 OT Individual Time Calculation (min): 58 min    Short Term Goals: Week 2:  OT Short Term Goal 1 (Week 2): Pt will complete UB dressing with min assist sitting unsupported.  OT Short Term Goal 2 (Week 2): Pt will complete LB dressing sit to stand with min assist for 2 consecutive sessions. OT Short Term Goal 3 (Week 2): Pt will complete walk-in shower transfers with min assist using the RW.  OT Short Term Goal 4 (Week 2): Pt will maintain standing balance with LB selfcare with min assist and no more than mod instructional cueing for midline orientation.   OT Short Term Goal 5 (Week 2): Pt will complete toilet transfers with min assist to 3:1 with RW.   Skilled Therapeutic Interventions/Progress Updates:    Pt completed toilet transfers to the bathroom with use of the RW and min assist during session.  Slight increased lean to the left during transfer noted as well.  Min assist for managing clothing sit to stand before transferring back out to the wheelchair at the sink for washing his hands with supervision.  Completed functional mobility to the therapy gym with use of the RW and mod assist.  Once in the gym focused on standing balance while engaged in reaching task with the RUE to place clothespins to target, emphasis on weightshifting to the right with functional reaching.  He was able to complete multiple reaches with min assist for balance and min facilitation to maintain left knee extension in standing.  Transitioned to removing them with use of the LUE and placing them back in the container.  Pt with motor planning deficits noted with reaching, with decreased ability to achieve full elbow extension with grasp.  Transitioned to sitting with instructions for pt to pick up clothespins from therapist and place on horizontal bar  with the LUE.  Pt with decreased FM control and functional reach overall.  Min assist to complete reaching with pt dropping approximately 10% of clothespins.  He was also able to complete placement of pegs using the peg board and LUE but only with 50% of accuracy.  Pt returned to room via walker to conclude session, mod assist needed for mobility secondary to increased left lean.  Pt left in wheelchair with family present and safety belt in place.    Therapy Documentation Precautions:  Precautions Precautions: Fall Precaution Comments: left lean in sitting and standing Restrictions Weight Bearing Restrictions: No  Pain: Pain Assessment Pain Assessment: No/denies pain ADL: See Function Navigator for Current Functional Status.   Therapy/Group: Individual Therapy  [place] OTR/L 08/26/2016, 12:57 PM

## 2016-08-26 NOTE — Progress Notes (Signed)
Physical Therapy Note  Patient Details  Name: [name] MRN: [id number] Date of Birth: 07-Jul-1930 Today's Date: 08/26/2016  1415-1530, 75 min individual tx Pain: no c/o  w/c propulsion using bil LEs, supervision.  [name] accompanied pt to tx.  PT instructed wife in head/hips relationship, and importance of slow safe movement during transfers.  She stated their son will live with them at d/c, as long as needed.  PT asked CSW to set up training for son. neuromuscular re-education via forced use, multimodal cues for alternating recipocal movement on NuStep level 5 x 6 minutes; seated alternating toe taps, heel taps, passing ball back and forth overhead to encourage trunk extension and pelvic tilts.  Gait with grocery cart x 115' , and x 150' with RW, including turns and backing up 5', with min assist fading to supervision on straight trajectory, max cues for upright posture, forward gaze, slow speed.  Wife walked in front of pt for visual cue to improve upright posture..  Pt left resting in w/c with quick release belt applied and all needs within reach.  See function navigator for current status.   Jerik Falletta 08/26/2016, 8:28 AM

## 2016-08-26 NOTE — Progress Notes (Signed)
Speech Language Pathology Weekly Progress and Session Note  Patient Details  Name: [name] MRN: [id number] Date of Birth: 08-21-1930  Beginning of progress report period: August 19, 2016 End of progress report period: August 26, 2016  Today's Date: 08/26/2016 SLP Individual Time: 0900-1000 SLP Individual Time Calculation (min): 60 min  Short Term Goals: Week 2: SLP Short Term Goal 1 (Week 2): Patient will solve basic problems related to self-care with Min assist. SLP Short Term Goal 1 - Progress (Week 2): Met SLP Short Term Goal 2 (Week 2): Patient will verbally identify 2 physical and 2 cognitive changes wtih Mod question cues  SLP Short Term Goal 2 - Progress (Week 2): Not met SLP Short Term Goal 3 (Week 2): Patient will consume Dys.3 textures and thin liquids with Min assist verbal question cues to recall and utilize safe swallow strategeis to manage oral residue and prevent overt s/s of aspiration.   SLP Short Term Goal 3 - Progress (Week 2): Met SLP Short Term Goal 4 (Week 2): Patient will sustain attention to familiar tasks for 10-20 minutes with Min assist verbal cues for redirection SLP Short Term Goal 4 - Progress (Week 2): Met SLP Short Term Goal 5 (Week 2): Patient will utilize speech intelligibilty strategies at the phrase-sentence level of verbal expression with Mod assist verbal cues  SLP Short Term Goal 5 - Progress (Week 2): Met    New Short Term Goals: Week 3: SLP Short Term Goal 1 (Week 3): Patient will solve basic familiar problems with Min assist. SLP Short Term Goal 2 (Week 3): Patient will verbally identify 2 physical and 2 cognitive changes wtih Max to Mod question cues  SLP Short Term Goal 3 (Week 3): Patient will consume Dys.3 textures and thin liquids with supervision question cues to recall and utilize safe swallow strategeis to manage oral residue and prevent overt s/s of aspiration.   SLP Short Term Goal 4 (Week 3): Patient will sustain attention to  familiar tasks for 30 minutes with Min assist verbal cues for redirection. SLP Short Term Goal 5 (Week 3): Patient will utilize speech intelligibilty strategies at the phrase-sentence level of verbal expression with Min assist verbal cues   Weekly Progress Updates: Pt has made steady progress this reporting period as evidenced by meeting 4 of 5 STGs. Pt with progress in the areas of solving basic problems related to self care, consuming dysphagia 3 diet with thin liquids, sustained attention for 10 minutes and use of speech intelligibility strategies at the phrase level with Mod A. Pt continue to require Mod A verbal cues for completion of basic familiar tasks that aren't related to self-care and he continues to be unable to identify 2 physical and 2 cognitive changes. Pt requires skilled ST to address the above goals, increase pt's functional independence and reduce caregiver burden prior to discharge.      Intensity: Minumum of 1-2 x/day, 30 to 90 minutes Frequency: 3 to 5 out of 7 days Duration/Length of Stay: 09/03/16 Treatment/Interventions: Cognitive remediation/compensation;Cueing hierarchy;Environmental controls;Functional tasks;Internal/external aids;Patient/family education;Other (comment);Speech/Language facilitation   Daily Session  Skilled Therapeutic Interventions: Skilled treatment session focused on cognition goals. SLP facilitated session by providing Mod A for simple basic problem solving such as money management tasks as well as sustained attention in mildly distracting environment for ~30 minutes. Pt with off-topic jokes during periods that he is unable to solve problems which further distracts from tasks. Pt was returned to room, left upright in wheelchair, safety belt donned  and in nursing presence.     Function:     Cognition Comprehension Comprehension assist level: Understands basic 90% of the time/cues < 10% of the time  Expression   Expression assist level: Expresses  basic 90% of the time/requires cueing < 10% of the time.  Social Interaction Social Interaction assist level: Interacts appropriately 90% of the time - Needs monitoring or encouragement for participation or interaction.  Problem Solving Problem solving assist level: Solves basic 75 - 89% of the time/requires cueing 10 - 24% of the time  Memory Memory assist level: Recognizes or recalls 75 - 89% of the time/requires cueing 10 - 24% of the time   General    Pain    Therapy/Group: Individual Therapy   Shamina Etheridge B. Rutherford Nail, M.S., CCC-SLP Speech-Language Pathologist   [name] 08/26/2016, 9:57 AM

## 2016-08-26 NOTE — Plan of Care (Signed)
Problem: RH PAIN MANAGEMENT Goal: RH STG PAIN MANAGED AT OR BELOW PT'S PAIN GOAL Outcome: Progressing 2/10   

## 2016-08-27 ENCOUNTER — Inpatient Hospital Stay (HOSPITAL_COMMUNITY): Payer: Medicare Other | Admitting: Occupational Therapy

## 2016-08-27 ENCOUNTER — Inpatient Hospital Stay (HOSPITAL_COMMUNITY): Payer: Medicare Other | Admitting: Physical Therapy

## 2016-08-27 ENCOUNTER — Inpatient Hospital Stay (HOSPITAL_COMMUNITY): Payer: Medicare Other | Admitting: Speech Pathology

## 2016-08-27 DIAGNOSIS — I69391 Dysphagia following cerebral infarction: Secondary | ICD-10-CM

## 2016-08-27 LAB — GLUCOSE, CAPILLARY: GLUCOSE-CAPILLARY: 117 mg/dL — AB (ref 65–99)

## 2016-08-27 MED ORDER — BETHANECHOL CHLORIDE 25 MG PO TABS
25.0000 mg | ORAL_TABLET | Freq: Three times a day (TID) | ORAL | Status: DC
  Administered 2016-08-27 – 2016-09-03 (×21): 25 mg via ORAL
  Filled 2016-08-27 (×21): qty 1

## 2016-08-27 MED ORDER — SODIUM CHLORIDE 0.45 % IV SOLN
INTRAVENOUS | Status: DC
  Administered 2016-08-27 – 2016-08-29 (×3): via INTRAVENOUS

## 2016-08-27 NOTE — Progress Notes (Signed)
Speech Language Pathology Daily Session Note  Patient Details  Name: [name] MRN: [id number] Date of Birth: Jan 03, 1931  Today's Date: 08/27/2016 SLP Individual Time: 1000-1100 SLP Individual Time Calculation (min): 60 min  Short Term Goals: Week 3: SLP Short Term Goal 1 (Week 3): Patient will solve basic familiar problems with Min assist. SLP Short Term Goal 2 (Week 3): Patient will verbally identify 2 physical and 2 cognitive changes wtih Max to Mod question cues  SLP Short Term Goal 3 (Week 3): Patient will consume Dys.3 textures and thin liquids with supervision question cues to recall and utilize safe swallow strategeis to manage oral residue and prevent overt s/s of aspiration.   SLP Short Term Goal 4 (Week 3): Patient will sustain attention to familiar tasks for 30 minutes with Min assist verbal cues for redirection. SLP Short Term Goal 5 (Week 3): Patient will utilize speech intelligibilty strategies at the phrase-sentence level of verbal expression with Min assist verbal cues   Skilled Therapeutic Interventions:   Skilled treatment session focused on cognition goals. SLP facilitated session by providing Min A to supervision for completion of basic familiar problems within a quiet environment. However when environment became mildly distracting, pt required Min A verbal cues for attention and completion of problem solving tasks. Pt able to state 1 physical deficit (dehydration) and need for IV fluids. Pt required Max A verbal cues to increase speech intelligibility at the sentence level to ~25%. Pt unable to comprehend and use speech intelligibility strategies during this session. Pt was returned to room, left upright in wheelchair, safety belt in place and all needs within reach. Continue per current plan of care.   Function:    Cognition Comprehension Comprehension assist level: Understands basic 90% of the time/cues < 10% of the time  Expression   Expression assist level:  Expresses basic 90% of the time/requires cueing < 10% of the time.  Social Interaction Social Interaction assist level: Interacts appropriately 90% of the time - Needs monitoring or encouragement for participation or interaction.  Problem Solving Problem solving assist level: Solves basic 90% of the time/requires cueing < 10% of the time  Memory Memory assist level: Recognizes or recalls 75 - 89% of the time/requires cueing 10 - 24% of the time    Pain    Therapy/Group: Individual Therapy  Kamari Bilek B. Dreama Saaverton, M.S., CCC-SLP Speech-Language Pathologist  Landynn Dupler 08/27/2016, 12:33 PM

## 2016-08-27 NOTE — Progress Notes (Signed)
Physical Therapy Note  Patient Details  Name: Para Skeanslbert Sistrunk MRN: [id number] Date of Birth: Jul 12, 1930 Today's Date: 08/27/2016    Time: 1515-1600 45 minutes  1:1 No c/o pain.  Gait with RW with 5# wt with min A in straight line, mod/max A with turning.  Repetition of obstacle course with multiple turns focusing on Lt attention and Lt LE control and coordination with min/mod A for balance with turns.  Tap ups with focus on Lt foot clearance multiple reps with mod cuing to continue to reduce foot drag.  Nu step per pt request x 5 minutes for UE/LE strength and reciprocal movements.   Raesean Bartoletti 08/27/2016, 4:08 PM

## 2016-08-27 NOTE — Progress Notes (Signed)
Subjective/Complaints:  Required catheterization this morning. Discussed poor fluid intake as well as worsening renal function with patient. He will try to drink more area and we discussed that he will need an IV placed for at least one night to rehydrate  Review of systems denies any pain with urination. No problems with his breathing. No joint pains. No chest pain  Objective: Vital Signs: Blood pressure 127/69, pulse 95, temperature 98.7 F (37.1 C), temperature source Oral, resp. rate 18, height 5' 9" (1.753 m), weight 80.3 kg (177 lb 0.5 oz), SpO2 95 %. No results found. Results for orders placed or performed during the hospital encounter of 08/12/16 (from the past 72 hour(s))  CBC     Status: Abnormal   Collection Time: 08/26/16  5:43 AM  Result Value Ref Range   WBC 17.0 (H) 4.0 - 10.5 K/uL   RBC 4.10 (L) 4.22 - 5.81 MIL/uL   Hemoglobin 12.8 (L) 13.0 - 17.0 g/dL   HCT 38.1 (L) 39.0 - 52.0 %   MCV 92.9 78.0 - 100.0 fL   MCH 31.2 26.0 - 34.0 pg   MCHC 33.6 30.0 - 36.0 g/dL   RDW 13.8 11.5 - 15.5 %   Platelets 256 150 - 400 K/uL  Basic metabolic panel     Status: Abnormal   Collection Time: 08/26/16  5:43 AM  Result Value Ref Range   Sodium 136 135 - 145 mmol/L   Potassium 4.4 3.5 - 5.1 mmol/L   Chloride 101 101 - 111 mmol/L   CO2 25 22 - 32 mmol/L   Glucose, Bld 99 65 - 99 mg/dL   BUN 20 6 - 20 mg/dL   Creatinine, Ser 1.65 (H) 0.61 - 1.24 mg/dL   Calcium 8.8 (L) 8.9 - 10.3 mg/dL   GFR calc non Af Amer 37 (L) >60 mL/min   GFR calc Af Amer 43 (L) >60 mL/min    Comment: (NOTE) The eGFR has been calculated using the CKD EPI equation. This calculation has not been validated in all clinical situations. eGFR's persistently <60 mL/min signify possible Chronic Kidney Disease.    Anion gap 10 5 - 15     HEENT: Poor dentition, upper earlobe with dermatitis Cardio: RRR and No murmur Resp: CTA B/L and Unlabored GI: BS positive and Nontender, nondistended Extremity:  No  Edema Skin:   Other Dry with poor toenail care Neuro: Lethargic, Abnormal Sensory Withdraws to pinch. Bilateral upper and lower limbs, further assessment not possible due to mental status and Abnormal Motor Patient does move antigravity. 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 No pain with passive range of motion Gen. no acute distress   Assessment/Plan: 1. 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 Physiatrist is providing close team supervision and 24 hour management of active medical problems listed below. Physiatrist and rehab team continue to assess barriers to discharge/monitor patient progress toward functional and medical goals. FIM: Function - Bathing Position: Wheelchair/chair at sink Body parts bathed by patient: Right arm, Left arm, Chest, Abdomen, Right upper leg, Left upper leg, Right lower leg, Left lower leg, Buttocks, Front perineal area Body parts bathed by helper: Back Bathing not applicable: Chest, Abdomen, Right upper leg, Left upper leg, Right lower leg, Left lower leg Assist  Level: Touching or steadying assistance(Pt > 75%)  Function- Upper Body Dressing/Undressing What is the patient wearing?: Button up shirt Pull over shirt/dress - Perfomed by patient: Thread/unthread right sleeve, Thread/unthread left sleeve Pull over shirt/dress - Perfomed by helper: Put head through opening, Pull shirt over trunk Button up shirt - Perfomed by patient: Thread/unthread left sleeve, Thread/unthread right sleeve, Pull shirt around back Button up shirt - Perfomed by helper: Button/unbutton shirt (He as able to complete 6/7 buttons) Assist Level: Supervision or verbal cues Function - Lower Body  Dressing/Undressing What is the patient wearing?: Pants, Socks, Shoes Position: Wheelchair/chair at [employer] - Performed by patient: Thread/unthread right underwear leg, Thread/unthread left underwear leg, Pull underwear up/down Pants- Performed by patient: Thread/unthread right pants leg, Thread/unthread left pants leg Pants- Performed by helper: Pull pants up/down Non-skid slipper socks- Performed by helper: Don/doff right sock, Don/doff left sock Socks - Performed by patient: Don/doff left sock, Don/doff right sock Socks - Performed by helper: Don/doff right sock, Don/doff left sock Shoes - Performed by patient: Don/doff right shoe, Don/doff left shoe, Fasten right, Fasten left Shoes - Performed by helper: Don/doff right shoe, Don/doff left shoe, Fasten right, Fasten left (therapist assist secondary to decreased time) Assist for footwear: Supervision/touching assist Assist for lower body dressing: Touching or steadying assistance (Pt > 75%)  Function - Toileting Toileting activity did not occur: Safety/medical concerns Toileting steps completed by patient: Adjust clothing prior to toileting Toileting steps completed by helper: Performs perineal hygiene, Adjust clothing after toileting Toileting Assistive Devices: Grab bar or rail Assist level: Touching or steadying assistance (Pt.75%)  Function - Air cabin crew transfer activity did not occur: Safety/medical concerns Toilet transfer assistive device: Grab bar, Walker Assist level to toilet: Moderate assist (Pt 50 - 74%/lift or lower) Assist level from toilet: Moderate assist (Pt 50 - 74%/lift or lower)  Function - Chair/bed transfer Chair/bed transfer activity did not occur: Safety/medical concerns Chair/bed transfer method: Stand pivot Chair/bed transfer assist level: Touching or steadying assistance (Pt > 75%) Chair/bed transfer assistive device: Armrests, Walker Chair/bed transfer details: Manual facilitation for  weight shifting, Verbal cues for precautions/safety  Function - Locomotion: Wheelchair Type: Manual Max wheelchair distance: 145f  Assist Level: Supervision or verbal cues Assist Level: Supervision or verbal cues Assist Level: Supervision or verbal cues Turns around,maneuvers to table,bed, and toilet,negotiates 3% grade,maneuvers on rugs and over doorsills: No Function - Locomotion: Ambulation Assistive device: Walker-rolling Max distance: 150 ft Assist level: Moderate assist (Pt 50 - 74%) Assist level: Moderate assist (Pt 50 - 74%) Assist level: Moderate assist (Pt 50 - 74%) Walk 150 feet activity did not occur: Safety/medical concerns Assist level: Moderate assist (Pt 50 - 74%) Walk 10 feet on uneven surfaces activity did not occur: Safety/medical concerns  Function - Comprehension Comprehension: Auditory Comprehension assist level: Understands basic 90% of the time/cues < 10% of the time  Function - Expression Expression: Verbal Expression assist level: Expresses basic 90% of the time/requires cueing < 10% of the time.  Function - Social Interaction Social Interaction assist level: Interacts appropriately 90% of the time - Needs monitoring or encouragement for participation or interaction.  Function - Problem Solving Problem solving assist level: Solves basic 75 - 89% of the time/requires cueing 10 - 24% of the time  Function - Memory Memory assist level: Recognizes or recalls 75 - 89% of the time/requires cueing 10 - 24% of the time Patient normally able to recall (first 3 days only): Current season, Location of own  room, Staff names and faces, That he or she is in a hospital  Medical Problem List and Plan: 1.  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  Cont CIR PT, OT, SLP pt progressing except for fluid intake, team conference in a.m. ,2.  DVT Prophylaxis/Anticoagulation: Pharmaceutical: Lovenox 3. Pain Management:  Tylenol prn for knee pain 4. Mood: LCSW to follow for evaluation and support.  5. Neuropsych: This patient is not fully capable of making decisions on his own behalf. 6. Skin/Wound Care: routine pressure relief  7. Fluids/Electrolytes/Nutrition: Monitor I/O.  8. PAF: Monitor HR bid.  Resting heart rate is normal at present  Vitals:   08/26/16 1414 08/27/16 0427  BP: (!) 111/53 127/69  Pulse: 69 95  Resp: 18 18  Temp: 98.4 F (36.9 C) 98.7 F (37.1 C)   9. HTN: Monitor BP. Cont meds, ,cont Zebeta 27m  10. Knee OA: Meds as necessary 11. Parkinson's disease: Cont to monitor 12. Urinary retention, likely multifactorial, continue Flomax, increase Urecholine,improved no I/O caths recorded , PVRs borderline high                                                                                           13. Hypokalemia likely related to IVF,resolved will D/C KCL and repeat BMET shows some elevation of creat, monitor,needs IVF Creat cont to rise, enc fluids, no nephrotoxic meds LOS (Days) 15 A FACE TO FACE EVALUATION WAS PERFORMED  Matisyn Cabeza E 08/27/2016, 6:14 AM

## 2016-08-27 NOTE — Progress Notes (Signed)
Occupational Therapy Session Note  Patient Details  Name: [name] MRN: [id number] Date of Birth: August 15, 1930  Today's Date: 08/27/2016 OT Individual Time: [id number] OT Individual Time Calculation (min): 28 min    Short Term Goals: Week 2:  OT Short Term Goal 1 (Week 2): Pt will complete UB dressing with min assist sitting unsupported.  OT Short Term Goal 2 (Week 2): Pt will complete LB dressing sit to stand with min assist for 2 consecutive sessions. OT Short Term Goal 3 (Week 2): Pt will complete walk-in shower transfers with min assist using the RW.  OT Short Term Goal 4 (Week 2): Pt will maintain standing balance with LB selfcare with min assist and no more than mod instructional cueing for midline orientation.   OT Short Term Goal 5 (Week 2): Pt will complete toilet transfers with min assist to 3:1 with RW.   Skilled Therapeutic Interventions/Progress Updates:    Pt worked on functional transfers to the simulated walk-in shower and to the tub shower during session.  Mod assist for initial transfer into the simulated walk-in shower with use of the RW for support.  Pt demonstrated multiple losses of balance to the left during transfer.  Next had pt complete tub transfer with use of the tub bench for support during transfer.  Min assist to complete this transfer as well.  Wife present during session.  Discussed both options and progress currently recommend use of tub bench for safety, which she also agrees with as well as the pt.  Finished session with return back to the room via wheelchair.  Pt left in chair with safety belt in place and call button in reach.    Therapy Documentation Precautions:  Precautions Precautions: Fall Precaution Comments: left lean in sitting and standing Restrictions Weight Bearing Restrictions: No  Pain: Pain Assessment Pain Assessment: No/denies pain ADL: See Function Navigator for Current Functional Status.   Therapy/Group: Individual  Therapy  Calandra Madura OTR/L 08/27/2016, 4:43 PM

## 2016-08-27 NOTE — Progress Notes (Signed)
Occupational Therapy Session Note  Patient Details  Name: Para Skeanslbert Cassara MRN: [id number] Date of Birth: Mar 13, 1931  Today's Date: 08/27/2016 OT Individual Time: 1100-1200 OT Individual Time Calculation (min): 60 min    Short Term Goals: Week 2:  OT Short Term Goal 1 (Week 2): Pt will complete UB dressing with min assist sitting unsupported.  OT Short Term Goal 2 (Week 2): Pt will complete LB dressing sit to stand with min assist for 2 consecutive sessions. OT Short Term Goal 3 (Week 2): Pt will complete walk-in shower transfers with min assist using the RW.  OT Short Term Goal 4 (Week 2): Pt will maintain standing balance with LB selfcare with min assist and no more than mod instructional cueing for midline orientation.   OT Short Term Goal 5 (Week 2): Pt will complete toilet transfers with min assist to 3:1 with RW.   Skilled Therapeutic Interventions/Progress Updates:    Upon entering the room, pt seated in wheelchair awaiting therapist. Pt with no c/o pain this session and requesting to take shower and change clothing. Pt ambulates 5' in bathroom with RW to TTB with min A and bathing from shower level. Pt standing with therapist assist for balance while he washed buttocks and peri area. Pt required max cues to utilize L UE for functional tasks. Pt transferred to wheelchair and dressed with sit <>stand with use of RW. As pt fatigues he required lifting assistance for standing. Pt crossing ankle to knee in order to don LB clothing items with steady assistance. Quick release belt donned and call bell placed within reach upon exiting the room.   Therapy Documentation Precautions:  Precautions Precautions: Fall Precaution Comments: left lean in sitting and standing Restrictions Weight Bearing Restrictions: No General:   Vital Signs:   Pain: Pain Assessment Pain Assessment: No/denies pain ADL: ADL ADL Comments: See Functional Tool for details Exercises:   Other Treatments:    See  Function Navigator for Current Functional Status.   Therapy/Group: Individual Therapy  Alen BleacherBradsher, Dallen Bunte P 08/27/2016, 12:12 PM

## 2016-08-28 ENCOUNTER — Inpatient Hospital Stay (HOSPITAL_COMMUNITY): Payer: Medicare Other | Admitting: Speech Pathology

## 2016-08-28 ENCOUNTER — Inpatient Hospital Stay (HOSPITAL_COMMUNITY): Payer: Medicare Other | Admitting: Physical Therapy

## 2016-08-28 ENCOUNTER — Inpatient Hospital Stay (HOSPITAL_COMMUNITY): Payer: Medicare Other | Admitting: Occupational Therapy

## 2016-08-28 ENCOUNTER — Inpatient Hospital Stay (HOSPITAL_COMMUNITY): Payer: Medicare Other

## 2016-08-28 LAB — URINALYSIS, ROUTINE W REFLEX MICROSCOPIC
Bilirubin Urine: NEGATIVE
Glucose, UA: NEGATIVE mg/dL
Ketones, ur: NEGATIVE mg/dL
Nitrite: NEGATIVE
Protein, ur: 30 mg/dL — AB
SPECIFIC GRAVITY, URINE: 1.015 (ref 1.005–1.030)
pH: 5 (ref 5.0–8.0)

## 2016-08-28 MED ORDER — TAMSULOSIN HCL 0.4 MG PO CAPS
0.8000 mg | ORAL_CAPSULE | Freq: Every day | ORAL | Status: DC
  Administered 2016-08-28 – 2016-09-02 (×6): 0.8 mg via ORAL
  Filled 2016-08-28 (×7): qty 2

## 2016-08-28 NOTE — Progress Notes (Signed)
Speech Language Pathology Daily Session Note  Patient Details  Name: [name] MRN: [id number] Date of Birth: 1931/05/27  Today's Date: 08/28/2016 SLP Individual Time: 1330-1400 SLP Individual Time Calculation (min): 30 min  Short Term Goals: Week 3: SLP Short Term Goal 1 (Week 3): Patient will solve basic familiar problems with Min assist. SLP Short Term Goal 2 (Week 3): Patient will verbally identify 2 physical and 2 cognitive changes wtih Max to Mod question cues  SLP Short Term Goal 3 (Week 3): Patient will consume Dys.3 textures and thin liquids with supervision question cues to recall and utilize safe swallow strategeis to manage oral residue and prevent overt s/s of aspiration.   SLP Short Term Goal 4 (Week 3): Patient will sustain attention to familiar tasks for 30 minutes with Min assist verbal cues for redirection. SLP Short Term Goal 5 (Week 3): Patient will utilize speech intelligibilty strategies at the phrase-sentence level of verbal expression with Min assist verbal cues   Skilled Therapeutic Interventions: Skilled treatment session focused cognition goals. SLP facilitated session by providing Mod A verbal cues for completion of novel card game. Pt required Mod A verbal cues for sustained attention to this task for ~ 30 minutes in mildly distracting environment. Pt with increased speech intelligibility this session and was ~75% at the sentence level with supervision cues. Pt was returned to room, left upright in wheelchair, safety belt donned and all needs within reach. Continue per current plan of care.      Function:  Eating Eating   Modified Consistency Diet: Yes Eating Assist Level: Set up assist for   Eating Set Up Assist For: Opening containers       Cognition Comprehension Comprehension assist level: Follows basic conversation/direction with no assist  Expression   Expression assist level: Expresses basic needs/ideas: With no assist  Social Interaction  Social Interaction assist level: Interacts appropriately 90% of the time - Needs monitoring or encouragement for participation or interaction.  Problem Solving Problem solving assist level: Solves basic problems with no assist  Memory Memory assist level: Recognizes or recalls 75 - 89% of the time/requires cueing 10 - 24% of the time    Pain Pain Assessment Pain Assessment: No/denies pain  Therapy/Group: Individual Therapy   Mayana Irigoyen B. Dreama Saaverton, M.S., CCC-SLP Speech-Language Pathologist   Jolan Upchurch 08/28/2016, 2:59 PM

## 2016-08-28 NOTE — Progress Notes (Signed)
Physical Therapy Note  Patient Details  Name: Para Skeanslbert Palmatier MRN: [id number] Date of Birth: [birth year] Today's Date: 08/28/2016  [id number], 30 min individual tx Pain:No c/o  w/c propulsion on level tile and carpet to day room. therapeutic activity in standing at high table: side stepping L><R to choose requested playing card, with bil UE support fading to 0UE support. No LOB 6/6 trials, but pt had LOB L and posteriorly while chatting at end of activity; external distractions affect pt's balance greatly.  Sidestepping L around high table x 25' with LOB at every corner, due to moving too fast.  Pt left resting in w/c with quick release belt applied and all needs within reach.    Keiarra Charon 08/28/2016, 3:22 PM

## 2016-08-28 NOTE — Progress Notes (Signed)
Social Work Patient ID: [name], male   DOB: 04/26/1931, 83 y.o.   MRN: [id number]  Met with pt and wife who to discuss team conference progress toward his goals of supervision/min assist level. Hopefully with bladder medicines will begin voiding on own. Wife and son will begin family education, in preparation for discharge next week. Upgraded some goals for ambulation to min assist level. Will discuss with son building a rail for the carport steps. Will continue to work on discharge needs.

## 2016-08-28 NOTE — Patient Care Conference (Addendum)
Inpatient RehabilitationTeam Conference and Plan of Care Update Date: 08/28/2016   Time: 10:30 AM    Patient Name: [name]      Medical Record Number: [id number]  Date of Birth: 08-06-1930 Sex: Male         Room/Bed: 4W08C/4W08C-01 Payor Info: Payor: MEDICARE / Plan: MEDICARE PART A AND B / Product Type: *No Product type* /    Admitting Diagnosis: L   CVA  Admit Date/Time:  08/12/2016  3:07 PM Admission Comments: No comment available   Primary Diagnosis:  <principal problem not specified> Principal Problem: <principal problem not specified>  Patient Active Problem List   Diagnosis Date Noted  . 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 Hyperlipidemia 08/12/2016  . Hypertensive emergency   . Benign essential HTN   . PAF (paroxysmal atrial fibrillation) (HCC)   . History of CVA (cerebrovascular accident)   . Parkinson disease (HCC)   . Primary osteoarthritis of right knee   . Leukocytosis   . AKI (acute kidney injury) [facility](HCC)     Expected Discharge Date: Expected Discharge Date: 09/03/16  Team Members Present: Physician leading conference: Arley PhenixJohn Rodenbough, PsyD;Dr. Claudette LawsAndrew Kirsteins Social Worker Present: Dossie DerBecky Timya Trimmer, LCSW PT Present: Wanda Plumparoline Cook, Antonietta JewelPT;Austin Tucker, PT OT Present: Perrin MalteseJames McGuire, OT SLP Present: Reuel DerbyHappi Overton, SLP PPS Coordinator present : Tora DuckMarie Noel, RN, CRRN     Current Status/Progress Goal Weekly Team Focus  Medical   very distractible, poor fluid intake , on IVF, + urinary retention  Improve po fluids, improve bladder emptying  recheck UA, adjust meds   Bowel/Bladder     Timed tolieting-bladder medications to assist with retension   Cont B & B   working on bladder retension  Swallow/Nutrition/ Hydration   Dysphagia 3 with thin liquids  Supervision with least restricitive diet  recall and carryover of compensatory swallow strategies   ADL's   supervision for UB bathing with min assist for UB drressing.  Min assist for  LB bathing and dressing sit to stand.  Min to mod assist for functional transfers with use of the RW.  Still with left pushing in standing and with functional transfers.    supervison overall with min for dynamic standing balance  selfcare retraining, balance retraining, neuromuscular re-education, LUE coordinaiton, pt/family education   Mobility   min A for transfers, min/mod A for gait  supervision to min assist overall (min for gait and stairs)  balance, gait, stairs, family ed   Communication   Simple sentence with Mod A cues for intelligibility  Min - downgraded 3/19  increase use of speech intelligibility strategies   Safety/Cognition/ Behavioral Observations  Mod A for basic problem sovling  Min - downgraded 3/19  increase sustained attention, basic problem solving   Pain        no skin issues     Skin        monitor skin no issues at time        *See Care Plan and progress notes for long and short-term goals.  Barriers to Discharge: see above    Possible Resolutions to Barriers:  see above    Discharge Planning/Teaching Needs:  Wife and son here daily and participatingin therapies. Son will need to begin family training. have questioned if they can manage pt at home especially once son goes back to work      Team Discussion:  Making good progress in therapies, should reach supervision-min assist level. Urinary retension increased flomax and  urechline dose. Dys 3 thin liquids. IV fluids a couple of nights. Will upgrade min gait goals. Will begin family training with wife and son.  Revisions to Treatment Plan:  DC 3/27. Upgraded gait goals and downgraded communication/cognition goals   Continued Need for Acute Rehabilitation Level of Care: The patient requires daily medical management by a physician with specialized training in physical medicine and rehabilitation for the following conditions: Daily direction of a multidisciplinary physical rehabilitation program to ensure  safe treatment while eliciting the highest outcome that is of practical value to the patient.: Yes Daily medical management of patient stability for increased activity during participation in an intensive rehabilitation regime.: Yes Daily analysis of laboratory values and/or radiology reports with any subsequent need for medication adjustment of medical intervention for : Pulmonary problems;Neurological problems;Nutritional problems  Lucy Chris 08/28/2016, 12:46 PM

## 2016-08-28 NOTE — Progress Notes (Signed)
Subjective/Complaints:    Review of systems denies any pain with urination. No problems with his breathing. No joint pains. No chest pain  Objective: Vital Signs: Blood pressure 132/83, pulse 69, temperature 98.4 F (36.9 C), temperature source Oral, resp. rate 18, height '5\' 9"'  (1.753 m), weight 80.3 kg (177 lb 0.5 oz), SpO2 97 %. No results found. Results for orders placed or performed during the hospital encounter of 08/12/16 (from the past 72 hour(s))  CBC     Status: Abnormal   Collection Time: 08/26/16  5:43 AM  Result Value Ref Range   WBC 17.0 (H) 4.0 - 10.5 K/uL   RBC 4.10 (L) 4.22 - 5.81 MIL/uL   Hemoglobin 12.8 (L) 13.0 - 17.0 g/dL   HCT 38.1 (L) 39.0 - 52.0 %   MCV 92.9 78.0 - 100.0 fL   MCH 31.2 26.0 - 34.0 pg   MCHC 33.6 30.0 - 36.0 g/dL   RDW 13.8 11.5 - 15.5 %   Platelets 256 150 - 400 K/uL  Basic metabolic panel     Status: Abnormal   Collection Time: 08/26/16  5:43 AM  Result Value Ref Range   Sodium 136 135 - 145 mmol/L   Potassium 4.4 3.5 - 5.1 mmol/L   Chloride 101 101 - 111 mmol/L   CO2 25 22 - 32 mmol/L   Glucose, Bld 99 65 - 99 mg/dL   BUN 20 6 - 20 mg/dL   Creatinine, Ser 1.65 (H) 0.61 - 1.24 mg/dL   Calcium 8.8 (L) 8.9 - 10.3 mg/dL   GFR calc non Af Amer 37 (L) >60 mL/min   GFR calc Af Amer 43 (L) >60 mL/min    Comment: (NOTE) The eGFR has been calculated using the CKD EPI equation. This calculation has not been validated in all clinical situations. eGFR's persistently <60 mL/min signify possible Chronic Kidney Disease.    Anion gap 10 5 - 15  Glucose, capillary     Status: Abnormal   Collection Time: 08/27/16 11:56 AM  Result Value Ref Range   Glucose-Capillary 117 (H) 65 - 99 mg/dL     HEENT: Poor dentition, upper earlobe with dermatitis Cardio: RRR and No murmur Resp: CTA B/L and Unlabored GI: BS positive and Nontender, nondistended Extremity:  No Edema Skin:   Other Dry with poor toenail care Neuro: Lethargic, Abnormal Sensory  Withdraws to pinch. Bilateral upper and lower limbs, further assessment not possible due to mental status and Abnormal Motor Patient does move antigravity. 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 No pain with passive range of motion Gen. no acute distress   Assessment/Plan: 1. 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 Physiatrist is providing close team supervision and 24 hour management of active medical problems listed below. Physiatrist and rehab team continue to assess barriers to discharge/monitor patient progress toward functional and medical goals. FIM: Function - Bathing Position: Shower Body parts bathed by patient: Right arm, Left arm, Chest, Abdomen, Right upper leg, Left upper leg, Right lower leg, Left lower leg, Buttocks, Front perineal area Body parts bathed by helper: Back Bathing not applicable: Chest, Abdomen, Right upper leg, Left upper leg, Right lower leg, Left lower leg Assist Level: Touching or steadying assistance(Pt > 75%)  Function- Upper Body  Dressing/Undressing What is the patient wearing?: Pull over shirt/dress Pull over shirt/dress - Perfomed by patient: Put head through opening, Thread/unthread left sleeve, Thread/unthread right sleeve Pull over shirt/dress - Perfomed by helper: Put head through opening, Pull shirt over trunk Button up shirt - Perfomed by patient: Thread/unthread left sleeve, Thread/unthread right sleeve, Pull shirt around back Button up shirt - Perfomed by helper: Button/unbutton shirt (He as able to complete 6/7 buttons) Assist Level: Supervision or verbal cues, Set up Function - Lower Body Dressing/Undressing What is the patient wearing?: Pants, Socks, Shoes Position:  Wheelchair/chair at [employer] - Performed by patient: Thread/unthread right underwear leg, Thread/unthread left underwear leg, Pull underwear up/down Pants- Performed by patient: Thread/unthread right pants leg, Thread/unthread left pants leg, Pull pants up/down Pants- Performed by helper: Pull pants up/down Non-skid slipper socks- Performed by helper: Don/doff right sock, Don/doff left sock Socks - Performed by patient: Don/doff left sock, Don/doff right sock Socks - Performed by helper: Don/doff right sock, Don/doff left sock Shoes - Performed by patient: Don/doff right shoe, Don/doff left shoe, Fasten right, Fasten left Shoes - Performed by helper: Don/doff right shoe, Don/doff left shoe, Fasten right, Fasten left (therapist assist secondary to decreased time) Assist for footwear: Supervision/touching assist Assist for lower body dressing: Touching or steadying assistance (Pt > 75%)  Function - Toileting Toileting activity did not occur: Safety/medical concerns Toileting steps completed by patient: Adjust clothing prior to toileting, Performs perineal hygiene, Adjust clothing after toileting Toileting steps completed by helper: Performs perineal hygiene, Adjust clothing after toileting Toileting Assistive Devices: Grab bar or rail Assist level: Touching or steadying assistance (Pt.75%)  Function - Air cabin crew transfer activity did not occur: Safety/medical concerns Toilet transfer assistive device: Grab bar, Walker Assist level to toilet: Touching or steadying assistance (Pt > 75%) Assist level from toilet: Touching or steadying assistance (Pt > 75%)  Function - Chair/bed transfer Chair/bed transfer activity did not occur: Safety/medical concerns Chair/bed transfer method: Stand pivot Chair/bed transfer assist level: Touching or steadying assistance (Pt > 75%) Chair/bed transfer assistive device: Armrests, Walker Chair/bed transfer details: Manual facilitation for  weight shifting, Verbal cues for precautions/safety  Function - Locomotion: Wheelchair Type: Manual Max wheelchair distance: 149f  Assist Level: Supervision or verbal cues Assist Level: Supervision or verbal cues Assist Level: Supervision or verbal cues Turns around,maneuvers to table,bed, and toilet,negotiates 3% grade,maneuvers on rugs and over doorsills: No Function - Locomotion: Ambulation Assistive device: Walker-rolling Max distance: 150 ft Assist level: Moderate assist (Pt 50 - 74%) Assist level: Moderate assist (Pt 50 - 74%) Assist level: Moderate assist (Pt 50 - 74%) Walk 150 feet activity did not occur: Safety/medical concerns Assist level: Moderate assist (Pt 50 - 74%) Walk 10 feet on uneven surfaces activity did not occur: Safety/medical concerns  Function - Comprehension Comprehension: Auditory Comprehension assist level: Understands basic 90% of the time/cues < 10% of the time  Function - Expression Expression: Verbal Expression assist level: Expresses basic 90% of the time/requires cueing < 10% of the time.  Function - Social Interaction Social Interaction assist level: Interacts appropriately 90% of the time - Needs monitoring or encouragement for participation or interaction.  Function - Problem Solving Problem solving assist level: Solves basic 90% of the time/requires cueing < 10% of the time  Function - Memory Memory assist level: Recognizes or recalls 75 - 89% of the time/requires cueing 10 - 24% of the time Patient normally able to recall (first 3 days only): Current season, Location of own  room, Staff names and faces, That he or she is in a hospital  Medical Problem List and Plan: 1.  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  Cont CIR PT, OT, SLP pt progressing except for fluid intake,Team conference today please see physician documentation under team conference tab, met with team face-to-face to  discuss problems,progress, and goals. Formulized individual treatment plan based on medical history, underlying problem and comorbidities.. ,2.  DVT Prophylaxis/Anticoagulation: Pharmaceutical: Lovenox 3. Pain Management: Tylenol prn for knee pain 4. Mood: LCSW to follow for evaluation and support.  5. Neuropsych: This patient is not fully capable of making decisions on his own behalf. 6. Skin/Wound Care: routine pressure relief  7. Fluids/Electrolytes/Nutrition: Monitor I/O.  8. PAF: Monitor HR bid.  Resting heart rate is normal at present  Vitals:   08/27/16 1500 08/28/16 0417  BP: 125/71 132/83  Pulse: 75 69  Resp: 18 18  Temp: 98.4 F (36.9 C) 98.4 F (36.9 C)   9. HTN: Monitor BP. Cont meds, ,cont Zebeta 28m  10. Knee OA: Meds as necessary 11. Parkinson's disease: Cont to monitor 12. Urinary retention, likely multifactorial, increase  Flomax to 0.865m, increase Urecholine 2542mID  Recheck UA, may need home with foley with urology f/u                                                                                         13. Hypokalemia likely related to IVF,resolved will D/C KCL and repeat BMET shows some elevation of creat, monitor,needs IVF Creat cont to rise, enc fluids, no nephrotoxic meds LOS (Days) 16 A FACE TO FACE EVALUATION WAS PERFORMED  Francine Hannan E 08/28/2016, 9:20 AM

## 2016-08-28 NOTE — Progress Notes (Signed)
Subjective/Complaints:  Feels like he needs a bowel movement as well as to empty his bladder. Requiring in and out catheterization twice a day. No fevers or chills  Review of systems denies any pain with urination. No problems with his breathing. No joint pains. No chest pain  Objective: Vital Signs: Blood pressure 132/83, pulse 69, temperature 98.4 F (36.9 C), temperature source Oral, resp. rate 18, height '5\' 9"'  (1.753 m), weight 80.3 kg (177 lb 0.5 oz), SpO2 97 %. No results found. Results for orders placed or performed during the hospital encounter of 08/12/16 (from the past 72 hour(s))  CBC     Status: Abnormal   Collection Time: 08/26/16  5:43 AM  Result Value Ref Range   WBC 17.0 (H) 4.0 - 10.5 K/uL   RBC 4.10 (L) 4.22 - 5.81 MIL/uL   Hemoglobin 12.8 (L) 13.0 - 17.0 g/dL   HCT 38.1 (L) 39.0 - 52.0 %   MCV 92.9 78.0 - 100.0 fL   MCH 31.2 26.0 - 34.0 pg   MCHC 33.6 30.0 - 36.0 g/dL   RDW 13.8 11.5 - 15.5 %   Platelets 256 150 - 400 K/uL  Basic metabolic panel     Status: Abnormal   Collection Time: 08/26/16  5:43 AM  Result Value Ref Range   Sodium 136 135 - 145 mmol/L   Potassium 4.4 3.5 - 5.1 mmol/L   Chloride 101 101 - 111 mmol/L   CO2 25 22 - 32 mmol/L   Glucose, Bld 99 65 - 99 mg/dL   BUN 20 6 - 20 mg/dL   Creatinine, Ser 1.65 (H) 0.61 - 1.24 mg/dL   Calcium 8.8 (L) 8.9 - 10.3 mg/dL   GFR calc non Af Amer 37 (L) >60 mL/min   GFR calc Af Amer 43 (L) >60 mL/min    Comment: (NOTE) The eGFR has been calculated using the CKD EPI equation. This calculation has not been validated in all clinical situations. eGFR's persistently <60 mL/min signify possible Chronic Kidney Disease.    Anion gap 10 5 - 15  Glucose, capillary     Status: Abnormal   Collection Time: 08/27/16 11:56 AM  Result Value Ref Range   Glucose-Capillary 117 (H) 65 - 99 mg/dL     HEENT: Poor dentition, upper earlobe with dermatitis Cardio: RRR and No murmur Resp: CTA B/L and Unlabored GI: BS  positive and Nontender, nondistended Extremity:  No Edema Skin:   Other Dry with poor toenail care Neuro: Lethargic, Abnormal Sensory Withdraws to pinch. Bilateral upper and lower limbs, further assessment not possible due to mental status and Abnormal Motor Patient does move antigravity. 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 No pain with passive range of motion Gen. no acute distress   Assessment/Plan: 1. 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 Physiatrist is providing close team supervision and 24 hour management of active medical problems listed below. Physiatrist and rehab team continue to assess barriers to discharge/monitor patient progress toward functional and medical goals. FIM: Function - Bathing Position: Shower Body parts bathed by patient: Right arm, Left arm, Chest, Abdomen, Right upper leg, Left upper leg, Right lower leg, Left lower leg, Buttocks, Front perineal area Body parts bathed by helper: Back Bathing not applicable: Chest,  Abdomen, Right upper leg, Left upper leg, Right lower leg, Left lower leg Assist Level: Touching or steadying assistance(Pt > 75%)  Function- Upper Body Dressing/Undressing What is the patient wearing?: Pull over shirt/dress Pull over shirt/dress - Perfomed by patient: Put head through opening, Thread/unthread left sleeve, Thread/unthread right sleeve Pull over shirt/dress - Perfomed by helper: Put head through opening, Pull shirt over trunk Button up shirt - Perfomed by patient: Thread/unthread left sleeve, Thread/unthread right sleeve, Pull shirt around back Button up shirt - Perfomed by helper: Button/unbutton shirt (He as able to complete 6/7 buttons) Assist Level:  Supervision or verbal cues, Set up Function - Lower Body Dressing/Undressing What is the patient wearing?: Pants, Socks, Shoes Position: Wheelchair/chair at [employer] - Performed by patient: Thread/unthread right underwear leg, Thread/unthread left underwear leg, Pull underwear up/down Pants- Performed by patient: Thread/unthread right pants leg, Thread/unthread left pants leg, Pull pants up/down Pants- Performed by helper: Pull pants up/down Non-skid slipper socks- Performed by helper: Don/doff right sock, Don/doff left sock Socks - Performed by patient: Don/doff left sock, Don/doff right sock Socks - Performed by helper: Don/doff right sock, Don/doff left sock Shoes - Performed by patient: Don/doff right shoe, Don/doff left shoe, Fasten right, Fasten left Shoes - Performed by helper: Don/doff right shoe, Don/doff left shoe, Fasten right, Fasten left (therapist assist secondary to decreased time) Assist for footwear: Supervision/touching assist Assist for lower body dressing: Touching or steadying assistance (Pt > 75%)  Function - Toileting Toileting activity did not occur: Safety/medical concerns Toileting steps completed by patient: Adjust clothing prior to toileting, Performs perineal hygiene, Adjust clothing after toileting Toileting steps completed by helper: Performs perineal hygiene, Adjust clothing after toileting Toileting Assistive Devices: Grab bar or rail Assist level: Touching or steadying assistance (Pt.75%)  Function - Air cabin crew transfer activity did not occur: Safety/medical concerns Toilet transfer assistive device: Grab bar, Walker Assist level to toilet: Touching or steadying assistance (Pt > 75%) Assist level from toilet: Touching or steadying assistance (Pt > 75%)  Function - Chair/bed transfer Chair/bed transfer activity did not occur: Safety/medical concerns Chair/bed transfer method: Stand pivot Chair/bed transfer assist level: Touching or  steadying assistance (Pt > 75%) Chair/bed transfer assistive device: Armrests, Walker Chair/bed transfer details: Manual facilitation for weight shifting, Verbal cues for precautions/safety  Function - Locomotion: Wheelchair Type: Manual Max wheelchair distance: 126f  Assist Level: Supervision or verbal cues Assist Level: Supervision or verbal cues Assist Level: Supervision or verbal cues Turns around,maneuvers to table,bed, and toilet,negotiates 3% grade,maneuvers on rugs and over doorsills: No Function - Locomotion: Ambulation Assistive device: Walker-rolling Max distance: 150 ft Assist level: Moderate assist (Pt 50 - 74%) Assist level: Moderate assist (Pt 50 - 74%) Assist level: Moderate assist (Pt 50 - 74%) Walk 150 feet activity did not occur: Safety/medical concerns Assist level: Moderate assist (Pt 50 - 74%) Walk 10 feet on uneven surfaces activity did not occur: Safety/medical concerns  Function - Comprehension Comprehension: Auditory Comprehension assist level: Understands basic 90% of the time/cues < 10% of the time  Function - Expression Expression: Verbal Expression assist level: Expresses basic 90% of the time/requires cueing < 10% of the time.  Function - Social Interaction Social Interaction assist level: Interacts appropriately 90% of the time - Needs monitoring or encouragement for participation or interaction.  Function - Problem Solving Problem solving assist level: Solves basic 90% of the time/requires cueing < 10% of the time  Function - Memory Memory assist level: Recognizes or recalls 75 -  89% of the time/requires cueing 10 - 24% of the time Patient normally able to recall (first 3 days only): Current season, Location of own room, Staff names and faces, That he or she is in a hospital  Medical Problem List and Plan: 1.  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  Cont CIR PT, OT, SLP pt progressing  except for fluid intake,Team conference today please see physician documentation under team conference tab, met with team face-to-face to discuss problems,progress, and goals. Formulized individual treatment plan based on medical history, underlying problem and comorbidities.. ,2.  DVT Prophylaxis/Anticoagulation: Pharmaceutical: Lovenox 3. Pain Management: Tylenol prn for knee pain 4. Mood: LCSW to follow for evaluation and support.  5. Neuropsych: This patient is not fully capable of making decisions on his own behalf. 6. Skin/Wound Care: routine pressure relief  7. Fluids/Electrolytes/Nutrition: Monitor I/O.  8. PAF: Monitor HR bid.  Resting heart rate is normal at present  Vitals:   08/27/16 1500 08/28/16 0417  BP: 125/71 132/83  Pulse: 75 69  Resp: 18 18  Temp: 98.4 F (36.9 C) 98.4 F (36.9 C)   9. HTN: Monitor BP. Cont meds, ,cont Zebeta 49m  10. Knee OA: Meds as necessary 11. Parkinson's disease: Cont to monitor 12. Urinary retention, likely multifactorial, increase  Flomax to 0.842m, increase Urecholine 2531mID  Recheck UA, may need home with foley with urology f/u                                                                                         13. Hypokalemia likely related to IVF,resolved will D/C KCL and repeat BMET shows some elevation of creat, monitor,needs IVF Creat cont to rise, enc fluids, no nephrotoxic meds LOS (Days) 16 A FACE TO FACE EVALUATION WAS PERFORMED  Dauntae Derusha E 08/28/2016, 9:26 AM

## 2016-08-28 NOTE — Progress Notes (Signed)
Physical Therapy Session Note  Patient Details  Name: [name] MRN: [id number] Date of Birth: [birth year]  Today's Date: 08/28/2016 PT Individual Time: 0919-1030 PT Individual Time Calculation (min): 71 min   Short Term Goals: Week 2:  PT Short Term Goal 1 (Week 2): pt will consistently transfer w/c>< mat/bed with supervision to R PT Short Term Goal 2 (Week 2): pt will perform gait with LRAD x 150' with min/mod assist PT Short Term Goal 3 (Week 2): pt will perform transiitonal backwards/sideways stepping x 5' in order to back up and turn to sit, without LOB 50% of attempts PT Short Term Goal 4 (Week 2): pt will ascend/descend 2 steps no rail with LRAD mod assist PT Short Term Goal 5 (Week 2): pt will stand x 3 minutes during functional reaching activity to the L, with 3 or less LOB  Skilled Therapeutic Interventions/Progress Updates:   Pt received sitting in WC and agreeable to PT  WC mobility x 236f with supervision assist and min cues for awareness of the R side and door frames  Gait without RW with min assist to stabilize and support trunk x2020f Gait with RW and 4# weight x [id number] [id number] supervision assist with straight path and min assist with turns. Moderate cues for increased width of turns and decreased speed to improve foot placement and prevent Posterior L LOB.   Car transfer training with min assist from PT to [place] sedan height. Moderate cues for sequencing and improved UE placement to prevent posterior LOB and improve reality of transfer.   PT instructed patient in stair training to ascend and descend 4 steps with BUE support. Min assist from PT. Pt self selected step over step pattern and noted to have difficulty attending to LUE and required moderate cues for safety and improved anterior and R weight shift to prevent LOB.  PT also instructed patient in ascent and descent of 2 step with RW. Min assist from PT and moderate cues for AD management to plac on top  of second step for ascent. PT utilized wide step for descent.   Blocked practice Sit<>stand transfer training with supervision assist from PT. Moderate cues for improved anterior weight shifting,   Patient returned to room and left sitting in WC [place] call bell in reach and all needs met.           Therapy Documentation Precautions:  Precautions Precautions: Fall Precaution Comments: left lean in sitting and standing Restrictions Weight Bearing Restrictions: No    Pain: Pain Assessment Pain Assessment: No/denies pain   See Function Navigator for Current Functional Status.   Therapy/Group: Individual Therapy  AustLorie Phenix1/2018, 10:54 AM

## 2016-08-28 NOTE — Progress Notes (Signed)
Occupational Therapy Weekly Progress Note  Patient Details  Name: [name] MRN: [id number] Date of Birth: 1931-04-29  Beginning of progress report period: August 22, 2016 End of progress report period: August 28, 2016  Today's Date: 08/28/2016 OT Individual Time: 0800-0901 OT Individual Time Calculation (min): 61 min    Patient has met 5 of 5 short term goals.  [name] continues to make steady progress with OT at this time.  He completes  Most bathing, dressing, and toileting tasks at a min assist level, but on occasion still requires mod assist for mobility to and from the bathroom with use of the RW.  Still with increased lean to the left with LOB frequently, especially with turns.  Decreased motor planning and functional use of the LUE with selfcare tasks, but still incorporates at a diminished level with all selfcare tasks with supervision.  [name] demonstrates flexed posture at the head and neck as well as trunk in standing, also resulting in posterior LOB at times.  Decreased occular ROM is still present with gaze to the right but it is improved.  Slight peripheral field deficit is also noted on the side as well but he is compensating for it with less than min instructional cueing.  Feel he is on target to meet supervision to min assist level goals with discharge expected to September 03, 2016.  Recommend continued CIR level therapies until discharge.     Patient continues to demonstrate the following deficits: muscle weakness, impaired timing and sequencing and decreased coordination, decreased awareness and decreased problem solving and decreased sitting balance, decreased standing balance, hemiplegia and decreased balance strategies and therefore will continue to benefit from skilled OT intervention to enhance overall performance with BADL and Reduce care partner burden.  Patient progressing toward long term goals..  Continue plan of care.  OT Short Term Goals Week 3:  OT Short  Term Goal 1 (Week 3): Continue working on established LTGs set at supervision to min assist.    Skilled Therapeutic Interventions/Progress Updates:    Pt completed shower and dressing during session.  Min assist for transfer to sitting on EOB from supine.  Min assist for ambulation to the toilet this session with use of the RW for support.  Transferred over to the tub bench for bathing tasks sit to stand.  Min assist for dressing sit to stand at the sink.  Still with decreased timing of hip and knee extension with occasional LOB posteriorly.  Still using the LUE for all tasks with supervision with increased time.  Mod instructional cueing still needed for hand placement with sit to stand as well as min instructional cueing for sequencing through bathing and dressing tasks.  Pt left in wheelchair with safety belt in place and call button in reach.  Pt left at sink working on shaving tasks.   Therapy Documentation Precautions:  Precautions Precautions: Fall Precaution Comments: left lean in sitting and standing Restrictions Weight Bearing Restrictions: No  Pain: Pain Assessment Pain Assessment: No/denies pain ADL: See Function Navigator for Current Functional Status.   Therapy/Group: Individual Therapy  Davinci Glotfelty OTR/L 08/28/2016, 10:37 AM

## 2016-08-28 NOTE — Plan of Care (Signed)
Problem: RH PAIN MANAGEMENT Goal: RH STG PAIN MANAGED AT OR BELOW PT'S PAIN GOAL Outcome: Progressing Less than 3,on 1 to 10 scale8

## 2016-08-29 ENCOUNTER — Inpatient Hospital Stay (HOSPITAL_COMMUNITY): Payer: Medicare Other | Admitting: Speech Pathology

## 2016-08-29 ENCOUNTER — Inpatient Hospital Stay (HOSPITAL_COMMUNITY): Payer: Medicare Other | Admitting: Physical Therapy

## 2016-08-29 ENCOUNTER — Inpatient Hospital Stay (HOSPITAL_COMMUNITY): Payer: Medicare Other | Admitting: Occupational Therapy

## 2016-08-29 LAB — BASIC METABOLIC PANEL
ANION GAP: 9 (ref 5–15)
BUN: 16 mg/dL (ref 6–20)
CALCIUM: 8.6 mg/dL — AB (ref 8.9–10.3)
CO2: 25 mmol/L (ref 22–32)
CREATININE: 1.49 mg/dL — AB (ref 0.61–1.24)
Chloride: 103 mmol/L (ref 101–111)
GFR, EST AFRICAN AMERICAN: 48 mL/min — AB (ref >60)
GFR, EST NON AFRICAN AMERICAN: 42 mL/min — AB (ref >60)
Glucose, Bld: 93 mg/dL (ref 65–99)
Potassium: 3.8 mmol/L (ref 3.5–5.1)
Sodium: 137 mmol/L (ref 135–145)

## 2016-08-29 NOTE — Progress Notes (Signed)
Physical Therapy Weekly Progress Note  Patient Details  Name: [name] MRN: [id number] Date of Birth: 1931-04-01  Beginning of progress report period: August 23, 2016 End of progress report period: August 29, 2016  Today's Date: 08/29/2016 PT Individual JQGB:[id number]   PT Individual Time Calculation:72 min   Patient has met 5 of 5 short term goals. Pt continues to make progress toward supervision-min assist goals. Pt has progressed to intermittent supervision-min assist for gait with and without AD, but contiues to require at least min assist to prevent lateral and posterior LOB with turns to the R. Transfers have improved to supervision-min  assist overall with cues for safety and anterior weight shift.   Patient continues to demonstrate the following deficits muscle weakness, decreased cardiorespiratoy endurance, impaired timing and sequencing, unbalanced muscle activation, motor apraxia, ataxia and decreased coordination, decreased attention to right and decreased standing balance, decreased postural control, hemiplegia and decreased balance strategies and therefore will continue to benefit from skilled PT intervention to increase functional independence with mobility.  Patient progressing toward long term goals..  Continue plan of care.  PT Short Term Goals Week 2:  PT Short Term Goal 1 (Week 2): pt will consistently transfer w/c>< mat/bed with supervision to R PT Short Term Goal 1 - Progress (Week 2): Met PT Short Term Goal 2 (Week 2): pt will perform gait with LRAD x 150' with min/mod assist PT Short Term Goal 2 - Progress (Week 2): Met PT Short Term Goal 3 (Week 2): pt will perform transiitonal backwards/sideways stepping x 5' in order to back up and turn to sit, without LOB 50% of attempts PT Short Term Goal 3 - Progress (Week 2): Met PT Short Term Goal 4 (Week 2): pt will ascend/descend 2 steps no rail with LRAD mod assist PT Short Term Goal 4 - Progress (Week 2): Met PT Short  Term Goal 5 (Week 2): pt will stand x 3 minutes during functional reaching activity to the L, with 3 or less LOB PT Short Term Goal 5 - Progress (Week 2): Met Week 3:  PT Short Term Goal 1 (Week 3): STG =LTG due to Estimated d/c date  Skilled Therapeutic Interventions/Progress Updates:   Pt received sitting in WC and agreeable to PT  WC mobility to rehab gym x 18f with supervision assist and only cues for safety through doorway.   Gait training with RW x 1218f [id number] [id number] assist form PT overall with one instance of Lateral LOB with turn through doorway. Pt instructed in prevention of crossing legs when turning and increased turning radius.   PT instructed patient in stair negotiation to match home set up with 2 steps and no rail with 16inch step depth on fist step. Min assist with RW with forward ascent and posterior descent. Pt also used corner of wall to simulate use of door frame for support with min assist for ascent and mod assist for descent posteriorly. Additional stair training with 1 rail support and close supervision assist from PT at standard stair height.   Side stepping through 24 inch pathway with supervision-min assist to simulate access to bathroom using RW for support. Moderate cues for gait pattern, AD management and improve anterior weight shift to prevent posterior LOB with turn to sit.   Patient returned to room and [facility] reach and all needs met.          Therapy Documentation Precautions:  Precautions Precautions: Fall Precaution Comments:  left lean in sitting and standing Restrictions Weight Bearing Restrictions: No General:   Vital Signs: Therapy Vitals Temp: 97.6 F (36.4 C) Temp Source: Oral Pulse Rate: 64 Resp: 18 BP: (!) 109/56 Patient Position (if appropriate): Sitting Oxygen Therapy SpO2: 100 % O2 Device: Not Delivered Pain: Pain Assessment Pain Assessment: No/denies pain   See Function  Navigator for Current Functional Status.  Therapy/Group: Individual Therapy  Lorie Phenix 08/29/2016, 2:05 PM

## 2016-08-29 NOTE — Progress Notes (Signed)
Speech Language Pathology Daily Session Note  Patient Details  Name: [name] MRN: [id number] Date of Birth: [birth year]  Today's Date: 08/29/2016 SLP Individual Time: 0900-1000 SLP Individual Time Calculation (min): 60 min  Short Term Goals: Week 3: SLP Short Term Goal 1 (Week 3): Patient will solve basic familiar problems with Min assist. SLP Short Term Goal 2 (Week 3): Patient will verbally identify 2 physical and 2 cognitive changes wtih Max to Mod question cues  SLP Short Term Goal 3 (Week 3): Patient will consume Dys.3 textures and thin liquids with supervision question cues to recall and utilize safe swallow strategeis to manage oral residue and prevent overt s/s of aspiration.   SLP Short Term Goal 4 (Week 3): Patient will sustain attention to familiar tasks for 30 minutes with Min assist verbal cues for redirection. SLP Short Term Goal 5 (Week 3): Patient will utilize speech intelligibilty strategies at the phrase-sentence level of verbal expression with Min assist verbal cues   Skilled Therapeutic Interventions: Skilled treatment session focused on cognition goals. SLP facilitated session by providing Min A verbal cues for sustained attention in moderately distracting environment for ~ 45 minutes. Pt required Min A verbal cues to solve basic familiar problems (checker and Blink). Pt able to express his wants/needs in sentence length utterances with >90% intelligibility with supervision cues. Pt able to state 2 physical and 2 cognition goals with Max A multimodal cues ut then makes jokes about deficits. Pt left upright in wheelchair, safety belt donned and all needs within reach. Continue per current plan of care.      Function:    Cognition Comprehension Comprehension assist level: Follows basic conversation/direction with no assist  Expression   Expression assist level: Expresses basic needs/ideas: With no assist  Social Interaction Social Interaction assist level: Interacts  appropriately 90% of the time - Needs monitoring or encouragement for participation or interaction.  Problem Solving Problem solving assist level: Solves basic problems with no assist  Memory Memory assist level: Recognizes or recalls 75 - 89% of the time/requires cueing 10 - 24% of the time    Pain Pain Assessment Pain Assessment: No/denies pain  Therapy/Group: Individual Therapy Brynnlee Cumpian B. Dreama Saaverton, M.S., CCC-SLP Speech-Language Pathologist  Novah Goza 08/29/2016, 10:48 AM

## 2016-08-29 NOTE — Progress Notes (Signed)
Occupational Therapy Session Note  Patient Details  Name: [name] MRN: [id number] Date of Birth: 1930/10/30  Today's Date: 08/29/2016 OT Individual Time: [id number] OT Individual Time Calculation (min): 57 min    Short Term Goals: Week 3:  OT Short Term Goal 1 (Week 3): Continue working on established LTGs set at supervision to min assist.    Skilled Therapeutic Interventions/Progress Updates:   Pt completed bathing and dressing during session.  Min assist for all transfers with use of the RW, including the shower, toilet, and wheelchair.  He was able to complete bathing with min assist overall but needed mod demonstrational cueing for sequencing and washing all body parts.  Min assist for sit to stand from the wheelchair with LB dressing tasks.  Still with increased left lean in sitting, requiring mod demonstrational cueing to correct.  He was able to complete UB dressing with supervision but needed increased time to donn the left sleeve secondary to motor planning deficits.  Therapist also placed more dycem in his seat in order to keep the wheelchair cushion from sliding as well.  Pt left in wheelchair with call button in reach and safety belt in place.  His spouse and son were both present as well.     Therapy Documentation Precautions:  Precautions Precautions: Fall Precaution Comments: left lean in sitting and standing Restrictions Weight Bearing Restrictions: No  Pain: Pain Assessment Pain Assessment: No/denies pain ADL: See Function Navigator for Current Functional Status.   Therapy/Group: Individual Therapy  Denson Niccoli OTR/L 08/29/2016, 12:44 PM

## 2016-08-29 NOTE — Progress Notes (Signed)
Subjective/Complaints:  Low volume straight cath, no pain with urination  Review of systems denies any pain with urination. No problems with his breathing. No joint pains. No chest pain  Objective: Vital Signs: Blood pressure 126/66, pulse 67, temperature 98.3 F (36.8 C), temperature source Oral, resp. rate 18, height '5\' 9"'  (1.753 m), weight 84.1 kg (185 lb 8.3 oz), SpO2 96 %. No results found. Results for orders placed or performed during the hospital encounter of 08/12/16 (from the past 72 hour(s))  Glucose, capillary     Status: Abnormal   Collection Time: 08/27/16 11:56 AM  Result Value Ref Range   Glucose-Capillary 117 (H) 65 - 99 mg/dL  Urinalysis, Routine w reflex microscopic     Status: Abnormal   Collection Time: 08/28/16  4:38 PM  Result Value Ref Range   Color, Urine AMBER (A) YELLOW    Comment: BIOCHEMICALS MAY BE AFFECTED BY COLOR   APPearance TURBID (A) CLEAR   Specific Gravity, Urine 1.015 1.005 - 1.030   pH 5.0 5.0 - 8.0   Glucose, UA NEGATIVE NEGATIVE mg/dL   Hgb urine dipstick SMALL (A) NEGATIVE   Bilirubin Urine NEGATIVE NEGATIVE   Ketones, ur NEGATIVE NEGATIVE mg/dL   Protein, ur 30 (A) NEGATIVE mg/dL   Nitrite NEGATIVE NEGATIVE   Leukocytes, UA MODERATE (A) NEGATIVE   RBC / HPF 6-30 0 - 5 RBC/hpf   WBC, UA TOO NUMEROUS TO COUNT 0 - 5 WBC/hpf   Bacteria, UA RARE (A) NONE SEEN   Squamous Epithelial / LPF 0-5 (A) NONE SEEN   WBC Clumps PRESENT    Mucous PRESENT    Non Squamous Epithelial 0-5 (A) NONE SEEN  Basic metabolic panel     Status: Abnormal   Collection Time: 08/29/16  5:07 AM  Result Value Ref Range   Sodium 137 135 - 145 mmol/L   Potassium 3.8 3.5 - 5.1 mmol/L   Chloride 103 101 - 111 mmol/L   CO2 25 22 - 32 mmol/L   Glucose, Bld 93 65 - 99 mg/dL   BUN 16 6 - 20 mg/dL   Creatinine, Ser 1.49 (H) 0.61 - 1.24 mg/dL   Calcium 8.6 (L) 8.9 - 10.3 mg/dL   GFR calc non Af Amer 42 (L) >60 mL/min   GFR calc Af Amer 48 (L) >60 mL/min     Comment: (NOTE) The eGFR has been calculated using the CKD EPI equation. This calculation has not been validated in all clinical situations. eGFR's persistently <60 mL/min signify possible Chronic Kidney Disease.    Anion gap 9 5 - 15     HEENT: Poor dentition, upper earlobe with dermatitis Cardio: RRR and No murmur Resp: CTA B/L and Unlabored GI: BS positive and Nontender, nondistended Extremity:  No Edema Skin:   Other Dry with poor toenail care Neuro: Lethargic, Abnormal Sensory Withdraws to pinch. Bilateral upper and lower limbs, further assessment not possible due to mental status and Abnormal Motor 4/5 BUE and BLE Right cranial nerve VI palsy , horizontal nystagmus to the left Musc/Skel:  Other No joint swelling noted in the upper or lower extremities. No pain with passive range of motion Gen. no acute distress   Assessment/Plan: 1. 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 Physiatrist is providing close team supervision and 24 hour management of active medical problems listed below. Physiatrist and rehab team continue to assess barriers to discharge/monitor patient progress toward  functional and medical goals. FIM: Function - Bathing Position: Shower Body parts bathed by patient: Right arm, Left arm, Chest, Abdomen, Right upper leg, Left upper leg, Right lower leg, Left lower leg, Buttocks, Front perineal area Body parts bathed by helper: Back Bathing not applicable: Chest, Abdomen, Right upper leg, Left upper leg, Right lower leg, Left lower leg Assist Level: Touching or steadying assistance(Pt > 75%)  Function- Upper Body Dressing/Undressing What is the patient wearing?: Pull over shirt/dress Pull over shirt/dress - Perfomed by patient: Put head through opening, Thread/unthread left sleeve, Thread/unthread right sleeve Pull over shirt/dress - Perfomed by  helper: Put head through opening, Pull shirt over trunk Button up shirt - Perfomed by patient: Thread/unthread left sleeve, Thread/unthread right sleeve, Pull shirt around back Button up shirt - Perfomed by helper: Button/unbutton shirt (He as able to complete 6/7 buttons) Assist Level: Supervision or verbal cues, Set up Function - Lower Body Dressing/Undressing What is the patient wearing?: Pants, Socks, Shoes, Non-skid slipper socks Position: Wheelchair/chair at sink Underwear - Performed by patient: Thread/unthread right underwear leg, Thread/unthread left underwear leg, Pull underwear up/down Pants- Performed by patient: Thread/unthread right pants leg, Thread/unthread left pants leg, Pull pants up/down Pants- Performed by helper: Pull pants up/down Non-skid slipper socks- Performed by helper: Don/doff right sock, Don/doff left sock Socks - Performed by patient: Don/doff right sock, Don/doff left sock Socks - Performed by helper: Don/doff right sock, Don/doff left sock Shoes - Performed by patient: Don/doff right shoe, Don/doff left shoe, Fasten right, Fasten left Shoes - Performed by helper: Don/doff right shoe, Don/doff left shoe, Fasten right, Fasten left (therapist assist secondary to decreased time) Assist for footwear: Supervision/touching assist Assist for lower body dressing: Touching or steadying assistance (Pt > 75%)  Function - Toileting Toileting activity did not occur: Safety/medical concerns Toileting steps completed by patient: Adjust clothing prior to toileting, Adjust clothing after toileting Toileting steps completed by helper: Performs perineal hygiene Toileting Assistive Devices: Grab bar or rail Assist level: Touching or steadying assistance (Pt.75%)  Function - Air cabin crew transfer activity did not occur: Safety/medical concerns Toilet transfer assistive device: Grab bar, Walker Assist level to toilet: Touching or steadying assistance (Pt >  75%) Assist level from toilet: Touching or steadying assistance (Pt > 75%)  Function - Chair/bed transfer Chair/bed transfer activity did not occur: Safety/medical concerns Chair/bed transfer method: Stand pivot Chair/bed transfer assist level: Touching or steadying assistance (Pt > 75%) Chair/bed transfer assistive device: Walker Chair/bed transfer details: Manual facilitation for weight shifting, Verbal cues for precautions/safety  Function - Locomotion: Wheelchair Type: Manual Max wheelchair distance: 250 Assist Level: Supervision or verbal cues Assist Level: Supervision or verbal cues Assist Level: Supervision or verbal cues Turns around,maneuvers to table,bed, and toilet,negotiates 3% grade,maneuvers on rugs and over doorsills: No Function - Locomotion: Ambulation Assistive device: Walker-rolling Max distance: 23f Assist level: Touching or steadying assistance (Pt > 75%) Assist level: Supervision or verbal cues Assist level: Touching or steadying assistance (Pt > 75%) Walk 150 feet activity did not occur: Safety/medical concerns Assist level: Touching or steadying assistance (Pt > 75%) Walk 10 feet on uneven surfaces activity did not occur: Safety/medical concerns  Function - Comprehension Comprehension: Auditory Comprehension assist level: Follows basic conversation/direction with no assist  Function - Expression Expression: Verbal Expression assist level: Expresses basic needs/ideas: With no assist  Function - Social Interaction Social Interaction assist level: Interacts appropriately 90% of the time - Needs monitoring or encouragement for participation or interaction.  Function -  Problem Solving Problem solving assist level: Solves basic problems with no assist  Function - Memory Memory assist level: Recognizes or recalls 75 - 89% of the time/requires cueing 10 - 24% of the time Patient normally able to recall (first 3 days only): Current season, Location of own  room, Staff names and faces, That he or she is in a hospital  Medical Problem List and Plan: 1.  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  Cont CIR PT, OT, SLP .. ,2.  DVT Prophylaxis/Anticoagulation: Pharmaceutical: Lovenox 3. Pain Management: Tylenol prn for knee pain 4. Mood: LCSW to follow for evaluation and support.  5. Neuropsych: This patient is not fully capable of making decisions on his own behalf. 6. Skin/Wound Care: routine pressure relief  7. Fluids/Electrolytes/Nutrition: Monitor I/O.  8. PAF: Monitor HR bid.  Resting heart rate is normal at present  Vitals:   08/28/16 1450 08/29/16 0418  BP: (!) 114/49 126/66  Pulse: 60 67  Resp: 18 18  Temp: 97.7 F (36.5 C) 98.3 F (36.8 C)   9. HTN: Monitor BP. Cont meds, ,cont Zebeta 21m  10. Knee OA: Meds as necessary 11. Parkinson's disease: Cont to monitor 12. Urinary retention, likely multifactorial, increase  Flomax to 0.842m, increase Urecholine 2557mID  Recheck UA,shows many WBC but rare bacteria , nitrite neg, await cx  may need home with foley with urology f/u                                                                                         13. Hypokalemia likely related to IVF,resolved will D/C KCL and repeat BMET shows some improvement  Creat 1.49, monitor, IVF , enc fluids, no nephrotoxic meds Looks like baseline creat around 1.25 LOS (Days) 17 A FACE TO FACE EVALUATION WAS PERFORMED  KIRSTEINS,ANDREW E 08/29/2016, 6:37 AM

## 2016-08-30 ENCOUNTER — Inpatient Hospital Stay (HOSPITAL_COMMUNITY): Payer: Medicare Other | Admitting: Occupational Therapy

## 2016-08-30 ENCOUNTER — Inpatient Hospital Stay (HOSPITAL_COMMUNITY): Payer: Medicare Other | Admitting: Physical Therapy

## 2016-08-30 ENCOUNTER — Inpatient Hospital Stay (HOSPITAL_COMMUNITY): Payer: Medicare Other | Admitting: Speech Pathology

## 2016-08-30 LAB — URINE CULTURE: Culture: >=100000 — AB

## 2016-08-30 MED ORDER — CEPHALEXIN 250 MG PO CAPS
250.0000 mg | ORAL_CAPSULE | Freq: Three times a day (TID) | ORAL | Status: DC
  Administered 2016-08-30 – 2016-09-03 (×13): 250 mg via ORAL
  Filled 2016-08-30 (×13): qty 1

## 2016-08-30 NOTE — Plan of Care (Signed)
Problem: RH PAIN MANAGEMENT Goal: RH STG PAIN MANAGED AT OR BELOW PT'S PAIN GOAL Outcome: Progressing Less than 3,on 1 to 10

## 2016-08-30 NOTE — Progress Notes (Signed)
Subjective/Complaints:  Patient working with physical therapy. No complaints today. Trying to drink more. Discussed results of urine culture  Review of systems denies any pain with urination. No problems with his breathing. No joint pains. No chest pain  Objective: Vital Signs: Blood pressure 135/70, pulse 81, temperature 98.7 F (37.1 C), temperature source Oral, resp. rate 18, height '5\' 9"'  (1.753 m), weight 84.1 kg (185 lb 8.3 oz), SpO2 97 %. No results found. Results for orders placed or performed during the hospital encounter of 08/12/16 (from the past 72 hour(s))  Glucose, capillary     Status: Abnormal   Collection Time: 08/27/16 11:56 AM  Result Value Ref Range   Glucose-Capillary 117 (H) 65 - 99 mg/dL  Urinalysis, Routine w reflex microscopic     Status: Abnormal   Collection Time: 08/28/16  4:38 PM  Result Value Ref Range   Color, Urine AMBER (A) YELLOW    Comment: BIOCHEMICALS MAY BE AFFECTED BY COLOR   APPearance TURBID (A) CLEAR   Specific Gravity, Urine 1.015 1.005 - 1.030   pH 5.0 5.0 - 8.0   Glucose, UA NEGATIVE NEGATIVE mg/dL   Hgb urine dipstick SMALL (A) NEGATIVE   Bilirubin Urine NEGATIVE NEGATIVE   Ketones, ur NEGATIVE NEGATIVE mg/dL   Protein, ur 30 (A) NEGATIVE mg/dL   Nitrite NEGATIVE NEGATIVE   Leukocytes, UA MODERATE (A) NEGATIVE   RBC / HPF 6-30 0 - 5 RBC/hpf   WBC, UA TOO NUMEROUS TO COUNT 0 - 5 WBC/hpf   Bacteria, UA RARE (A) NONE SEEN   Squamous Epithelial / LPF 0-5 (A) NONE SEEN   WBC Clumps PRESENT    Mucous PRESENT    Non Squamous Epithelial 0-5 (A) NONE SEEN  Urine culture     Status: Abnormal (Preliminary result)   Collection Time: 08/28/16  4:38 PM  Result Value Ref Range   Specimen Description URINE, CATHETERIZED    Special Requests NONE    Culture >=100,000 COLONIES/mL ESCHERICHIA COLI (A)    Report Status PENDING   Basic metabolic panel     Status: Abnormal   Collection Time: 08/29/16  5:07 AM  Result Value Ref Range   Sodium  137 135 - 145 mmol/L   Potassium 3.8 3.5 - 5.1 mmol/L   Chloride 103 101 - 111 mmol/L   CO2 25 22 - 32 mmol/L   Glucose, Bld 93 65 - 99 mg/dL   BUN 16 6 - 20 mg/dL   Creatinine, Ser 1.49 (H) 0.61 - 1.24 mg/dL   Calcium 8.6 (L) 8.9 - 10.3 mg/dL   GFR calc non Af Amer 42 (L) >60 mL/min   GFR calc Af Amer 48 (L) >60 mL/min    Comment: (NOTE) The eGFR has been calculated using the CKD EPI equation. This calculation has not been validated in all clinical situations. eGFR's persistently <60 mL/min signify possible Chronic Kidney Disease.    Anion gap 9 5 - 15     HEENT: Poor dentition, upper earlobe with dermatitis Cardio: RRR and No murmur Resp: CTA B/L and Unlabored GI: BS positive and Nontender, nondistended Extremity:  No Edema Skin:   Other Dry with poor toenail care Neuro: Lethargic, Abnormal Sensory Withdraws to pinch. Bilateral upper and lower limbs, further assessment not possible due to mental status and Abnormal Motor 4/5 BUE and BLE Right cranial nerve VI palsy , horizontal nystagmus to the left Musc/Skel:  Other No joint swelling noted in the upper or lower extremities. No pain with passive  range of motion Gen. no acute distress   Assessment/Plan: 1. 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 Physiatrist is providing close team supervision and 24 hour management of active medical problems listed below. Physiatrist and rehab team continue to assess barriers to discharge/monitor patient progress toward functional and medical goals. FIM: Function - Bathing Position: Shower Body parts bathed by patient: Right arm, Left arm, Chest, Abdomen, Right upper leg, Left upper leg, Right lower leg, Left lower leg, Buttocks, Front perineal area Body parts bathed by helper: Back Bathing not applicable: Chest, Abdomen, Right upper leg, Left upper leg, Right lower leg, Left  lower leg Assist Level: Touching or steadying assistance(Pt > 75%)  Function- Upper Body Dressing/Undressing What is the patient wearing?: Pull over shirt/dress Pull over shirt/dress - Perfomed by patient: Put head through opening, Thread/unthread left sleeve, Thread/unthread right sleeve, Pull shirt over trunk Pull over shirt/dress - Perfomed by helper: Put head through opening, Pull shirt over trunk Button up shirt - Perfomed by patient: Thread/unthread left sleeve, Thread/unthread right sleeve, Pull shirt around back Button up shirt - Perfomed by helper: Button/unbutton shirt (He as able to complete 6/7 buttons) Assist Level: Supervision or verbal cues Function - Lower Body Dressing/Undressing What is the patient wearing?: Pants, Socks, Shoes Position: Wheelchair/chair at [employer] - Performed by patient: Thread/unthread right underwear leg, Thread/unthread left underwear leg, Pull underwear up/down Pants- Performed by patient: Thread/unthread right pants leg, Thread/unthread left pants leg, Pull pants up/down Pants- Performed by helper: Pull pants up/down Non-skid slipper socks- Performed by helper: Don/doff right sock, Don/doff left sock Socks - Performed by patient: Don/doff right sock, Don/doff left sock Socks - Performed by helper: Don/doff right sock, Don/doff left sock Shoes - Performed by patient: Don/doff right shoe, Don/doff left shoe, Fasten right, Fasten left Shoes - Performed by helper: Don/doff right shoe, Don/doff left shoe, Fasten right, Fasten left (therapist assist secondary to decreased time) Assist for footwear: Supervision/touching assist Assist for lower body dressing: Touching or steadying assistance (Pt > 75%)  Function - Toileting Toileting activity did not occur: Safety/medical concerns Toileting steps completed by patient: Adjust clothing prior to toileting, Adjust clothing after toileting, Performs perineal hygiene Toileting steps completed by helper:  Performs perineal hygiene Toileting Assistive Devices: Grab bar or rail Assist level: Touching or steadying assistance (Pt.75%)  Function - Air cabin crew transfer activity did not occur: Safety/medical concerns Toilet transfer assistive device: Grab bar, Walker Assist level to toilet: Touching or steadying assistance (Pt > 75%) Assist level from toilet: Touching or steadying assistance (Pt > 75%)  Function - Chair/bed transfer Chair/bed transfer activity did not occur: Safety/medical concerns Chair/bed transfer method: Stand pivot Chair/bed transfer assist level: Touching or steadying assistance (Pt > 75%) Chair/bed transfer assistive device: Walker Chair/bed transfer details: Manual facilitation for weight shifting, Verbal cues for precautions/safety  Function - Locomotion: Wheelchair Type: Manual Max wheelchair distance: 250 Assist Level: Supervision or verbal cues Assist Level: Supervision or verbal cues Assist Level: Supervision or verbal cues Turns around,maneuvers to table,bed, and toilet,negotiates 3% grade,maneuvers on rugs and over doorsills: No Function - Locomotion: Ambulation Assistive device: Walker-rolling Max distance: 29f Assist level: Touching or steadying assistance (Pt > 75%) Assist level: Supervision or verbal cues Assist level: Touching or steadying assistance (Pt > 75%) Walk 150 feet activity did not occur: Safety/medical concerns Assist level: Touching or steadying assistance (Pt > 75%) Walk 10 feet on uneven surfaces  activity did not occur: Safety/medical concerns  Function - Comprehension Comprehension: Auditory Comprehension assist level: Follows basic conversation/direction with no assist  Function - Expression Expression: Verbal Expression assist level: Expresses basic needs/ideas: With no assist  Function - Social Interaction Social Interaction assist level: Interacts appropriately 90% of the time - Needs monitoring or encouragement  for participation or interaction.  Function - Problem Solving Problem solving assist level: Solves basic 75 - 89% of the time/requires cueing 10 - 24% of the time  Function - Memory Memory assist level: Recognizes or recalls 75 - 89% of the time/requires cueing 10 - 24% of the time Patient normally able to recall (first 3 days only): Current season, Location of own room, Staff names and faces, That he or she is in a hospital  Medical Problem List and Plan: 1.  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  Cont CIR PT, OT, SLP .. ,2.  DVT Prophylaxis/Anticoagulation: Pharmaceutical: Lovenox 3. Pain Management: Tylenol prn for knee pain 4. Mood: LCSW to follow for evaluation and support.  5. Neuropsych: This patient is not fully capable of making decisions on his own behalf. 6. Skin/Wound Care: routine pressure relief  7. Fluids/Electrolytes/Nutrition: Monitor I/O.  8. PAF: Monitor HR bid.  Resting heart rate is normal at present , no anticoagulation due to [place] Vitals:   08/29/16 1346 08/30/16 0540  BP: (!) 109/56 135/70  Pulse: 64 81  Resp: 18 18  Temp: 97.6 F (36.4 C) 98.7 F (37.1 C)   9. HTN: Monitor BP. Cont meds, ,cont Zebeta 89m  10. Knee OA: Meds as necessary 11. Parkinson's disease: Cont to monitor 12. Urinary retention, likely multifactorial, increase  Flomax to 0.854m, increase Urecholine 2523mID  Recheck UA,shows many WBC but rare bacteria , nitrite neg, cx 100K e coli, start keflex pnd sens                                                                                       13. Hypokalemia likely related to IVF,resolved will D/C KCL and repeat BMET shows some improvement  Creat 1.49, monitor, D/C IVF , enc fluids, no nephrotoxic meds Looks like baseline creat around 1.25 LOS (Days) 18 A FACE TO FACE EVALUATION WAS PERFORMED  KIRSTEINS,ANDREW E 08/30/2016, 6:39 AM

## 2016-08-30 NOTE — Progress Notes (Signed)
Speech Language Pathology Daily Session Note  Patient Details  Name: [name] MRN: [id number] Date of Birth: Jul 14, 1930  Today's Date: 08/30/2016 SLP Individual Time: 0930-1000 SLP Individual Time Calculation (min): 30 min  Short Term Goals: Week 3: SLP Short Term Goal 1 (Week 3): Patient will solve basic familiar problems with Min assist. SLP Short Term Goal 2 (Week 3): Patient will verbally identify 2 physical and 2 cognitive changes wtih Max to Mod question cues  SLP Short Term Goal 3 (Week 3): Patient will consume Dys.3 textures and thin liquids with supervision question cues to recall and utilize safe swallow strategeis to manage oral residue and prevent overt s/s of aspiration.   SLP Short Term Goal 4 (Week 3): Patient will sustain attention to familiar tasks for 30 minutes with Min assist verbal cues for redirection. SLP Short Term Goal 5 (Week 3): Patient will utilize speech intelligibilty strategies at the phrase-sentence level of verbal expression with Min assist verbal cues   Skilled Therapeutic Interventions: Skilled treatment session focused on cognition goals. SLP facilitated session by providing Min A verbal cues for completion of semi-complex peg design tasks in a moderately distracting environment. Pt required Min A verbal cues for sustained attention to tasks for ~ 30 minutes. Pt able ot recall plan to decrease dehydration. Pt was returned to room, left upright in wheelchair, safety belt donned and all needs within reach. Continue per current plan of care.      Function:    Cognition Comprehension Comprehension assist level: Follows basic conversation/direction with no assist  Expression   Expression assist level: Expresses basic needs/ideas: With no assist  Social Interaction Social Interaction assist level: Interacts appropriately 90% of the time - Needs monitoring or encouragement for participation or interaction.  Problem Solving Problem solving assist level:  Solves basic 75 - 89% of the time/requires cueing 10 - 24% of the time  Memory Memory assist level: Recognizes or recalls 75 - 89% of the time/requires cueing 10 - 24% of the time    Pain    Therapy/Group: Individual Therapy   Kenlee Vogt B. Dreama Saaverton, M.S., CCC-SLP Speech-Language Pathologist   [name] 08/30/2016, 9:54 AM

## 2016-08-30 NOTE — Progress Notes (Signed)
Occupational Therapy Session Note  Patient Details  Name: [name] MRN: [id number] Date of Birth: 06-Apr-1931  Today's Date: 08/30/2016 OT Individual Time: 1107-1202 OT Individual Time Calculation (min): 55 min    Short Term Goals: Week 3:  OT Short Term Goal 1 (Week 3): Continue working on established LTGs set at supervision to min assist.    Skilled Therapeutic Interventions/Progress Updates:    Pt completed toileting and toilet transfers to start session with use of the RW for support.  Educated pt's wife and pt's son [name] on safe completion and both were able to return demonstrate assist to the bathroom.  Recommend use of gait belt for safety.  He was able to complete all aspects of toileting and transfers with min assist.  Completed functional transfers to the tub bench as well with both son and pt's spouse.  He was able to complete these with min assist as well.  LOB X 2 with son during transfer but he was able to safely help him correct the loss of balance.  No LOB when spouse was assisting.  Pt returned to room at end of session with call button and phone in room and safety belt in place.    Therapy Documentation Precautions:  Precautions Precautions: Fall Precaution Comments: left lean in sitting and standing Restrictions Weight Bearing Restrictions: No  Pain: Pain Assessment Pain Assessment: No/denies pain ADL: ADL ADL Comments: See Functional Tool for details  See Function Navigator for Current Functional Status.   Therapy/Group: Individual Therapy  Francesco Provencal OTR/L 08/30/2016, 3:48 PM

## 2016-08-30 NOTE — Progress Notes (Signed)
Physical Therapy Session Note  Patient Details  Name: [name] MRN: [id number] Date of Birth: Oct 28, 1930  Today's Date: 08/30/2016 PT Individual Time: 0805-0900 AND 1615-1700 PT Individual Time Calculation (min): 55 min   Short Term Goals: Week 3:  PT Short Term Goal 1 (Week 3): STG =LTG due to Estimated d/c date  Skilled Therapeutic Interventions/Progress Updates:    Pt received sitting in WC and agreeable to PT  PT assisted pt with dressing task from [facility] to don pants, shirt and socks with supervision assist for clothing management and min assist while performing standing balance to pull pants to waist. Significant increase time required for sock and shoe management with figure 4 technique due to limited ROM R>L.   Pt transported pt to rehab gym in [facility] for energy conservation.   Dynamic balance training instructed by PT without AD. Forward /backward walking 11f each directions x 8 min assist throughout with moderate cues for improved step length and proper weight shifting. Weave through 6 cones without AD, min assist with turns to the L and mod assist with turns to the R as well as moderate cues for gait pattern and improve postural alignment. semitendem stand with trunk rotations L and R, Min assist progressing to supervision.   Gait training with AD x [id number] supervision assist ooverall and min assist in turns t prevent L alteral LOB.   Gait without AD x [id number] min assist and min cues for posture .   [facility] returned to room and [facility] reach and all needs met.     Session 2.   Pt received sitting in WC and agreeable to PT Family present for education and safety training. Pt instructed pts son in safe guarding technique and proper cueing for safety in turns. With gait and transfers. Overall pt's son able to provide min-supervisoin assist for gait x [id number] with sit<>stand and car transfers. Min cues for proper for safety in turns  and proper use of Ad.   Stair training to ascend 2 steps with UE support on RW and min assist form pts son. Moderate cues for Ad management and step to gait pattern. Pt also ascend and descend 4 6 inch steps with 1 oe rail with min assist from PT and son with cues form PT for step to gait pattern and sequencing.   [facility] returned to room and [facility] and all needs met.          Therapy Documentation Precautions:  Precautions Precautions: Fall Precaution Comments: left lean in sitting and standing Restrictions Weight Bearing Restrictions: No General:   Vital Signs: Therapy Vitals Temp: 98.7 F (37.1 C) Temp Source: Oral Pulse Rate: 81 Resp: 18 BP: 135/70 Patient Position (if appropriate): Lying Oxygen Therapy SpO2: 97 % O2 Device: Not Delivered Pain:   0/10   See Function Navigator for Current Functional Status.   Therapy/Group: Individual Therapy  AustiLorie Phenix/2018, 9:02 AM

## 2016-08-31 ENCOUNTER — Inpatient Hospital Stay (HOSPITAL_COMMUNITY): Payer: Medicare Other

## 2016-08-31 ENCOUNTER — Inpatient Hospital Stay (HOSPITAL_COMMUNITY): Payer: Medicare Other | Admitting: Occupational Therapy

## 2016-08-31 DIAGNOSIS — N390 Urinary tract infection, site not specified: Secondary | ICD-10-CM

## 2016-08-31 DIAGNOSIS — A499 Bacterial infection, unspecified: Secondary | ICD-10-CM

## 2016-08-31 NOTE — Progress Notes (Signed)
Occupational Therapy Session Note  Patient Details  Name: [name] MRN: [id number] Date of Birth: [birth year]  Today's Date: 08/31/2016 OT Individual Time: 1132-1205 OT Individual Time Calculation (min): 33 min   Short Term Goals: Week 3:  OT Short Term Goal 1 (Week 3): Continue working on established LTGs set at supervision to min assist.    Skilled Therapeutic Interventions/Progress Updates: Therapeutic activities with focus on improved safety awareness, visual scanning, dynamic standing balance, LUE coordination, and family education (relating to fall prevention).   Pt received seated in w/c with QRB applied.  With setup to provide RW and remove QRB pt ambulated to TBI clinic with contact guard, vc to slow down pace and attend to placement of LLE during gait, and vc to scan walls to identify objects placed (pictures, signage).   Pt rested seated in TBI clinic and was re-educated on goals relating to use of dynavision.   Pt completed 2 tests using left UE for  60 sec and 120 seconds demonstrating excellent scanning strategy but limited d/t left shoulder weakness.   After returning to room pt was educated on LUE strengthening using theraband, performing lateral raise, pull-down and scapular chest pulls, 10 reps each, yellow band (light resistance) with hand-over-hand guidance for proper technique.  Spouse and pt were educated on fall risk d/t instability during right turns when walking too quickly with RW.      Therapy Documentation Precautions:  Precautions Precautions: Fall Precaution Comments: left lean in sitting and standing Restrictions Weight Bearing Restrictions: No   Pain: Pain Assessment Pain Assessment: No/denies pain  ADL: ADL ADL Comments: See Functional Tool for details   See Function Navigator for Current Functional Status.   Therapy/Group: Individual Therapy  Donaven Criswell 08/31/2016, 12:26 PM

## 2016-08-31 NOTE — Progress Notes (Addendum)
Subjective/Complaints:  Up in chair with waist belt. States he needs to go to the bathroom again.   ROS: pt denies nausea, vomiting, diarrhea, cough, shortness of breath or chest pain   Objective: Vital Signs: Blood pressure (!) 147/66, pulse 76, temperature 97.9 F (36.6 C), temperature source Oral, resp. rate 16, height '5\' 9"'  (1.753 m), weight 84.1 kg (185 lb 8.3 oz), SpO2 96 %. No results found. Results for orders placed or performed during the hospital encounter of 08/12/16 (from the past 72 hour(s))  Urinalysis, Routine w reflex microscopic     Status: Abnormal   Collection Time: 08/28/16  4:38 PM  Result Value Ref Range   Color, Urine AMBER (A) YELLOW    Comment: BIOCHEMICALS MAY BE AFFECTED BY COLOR   APPearance TURBID (A) CLEAR   Specific Gravity, Urine 1.015 1.005 - 1.030   pH 5.0 5.0 - 8.0   Glucose, UA NEGATIVE NEGATIVE mg/dL   Hgb urine dipstick SMALL (A) NEGATIVE   Bilirubin Urine NEGATIVE NEGATIVE   Ketones, ur NEGATIVE NEGATIVE mg/dL   Protein, ur 30 (A) NEGATIVE mg/dL   Nitrite NEGATIVE NEGATIVE   Leukocytes, UA MODERATE (A) NEGATIVE   RBC / HPF 6-30 0 - 5 RBC/hpf   WBC, UA TOO NUMEROUS TO COUNT 0 - 5 WBC/hpf   Bacteria, UA RARE (A) NONE SEEN   Squamous Epithelial / LPF 0-5 (A) NONE SEEN   WBC Clumps PRESENT    Mucous PRESENT    Non Squamous Epithelial 0-5 (A) NONE SEEN  Urine culture     Status: Abnormal   Collection Time: 08/28/16  4:38 PM  Result Value Ref Range   Specimen Description URINE, CATHETERIZED    Special Requests NONE    Culture >=100,000 COLONIES/mL ESCHERICHIA COLI (A)    Report Status 08/30/2016 FINAL    Organism ID, Bacteria ESCHERICHIA COLI (A)       Susceptibility   Escherichia coli - MIC*    AMPICILLIN >=32 RESISTANT Resistant     CEFAZOLIN <=4 SENSITIVE Sensitive     CEFTRIAXONE <=1 SENSITIVE Sensitive     CIPROFLOXACIN >=4 RESISTANT Resistant     GENTAMICIN >=16 RESISTANT Resistant     IMIPENEM <=0.25 SENSITIVE Sensitive     NITROFURANTOIN <=16 SENSITIVE Sensitive     TRIMETH/SULFA >=320 RESISTANT Resistant     AMPICILLIN/SULBACTAM >=32 RESISTANT Resistant     PIP/TAZO 64 INTERMEDIATE Intermediate     Extended ESBL NEGATIVE Sensitive     * >=100,000 COLONIES/mL ESCHERICHIA COLI  Basic metabolic panel     Status: Abnormal   Collection Time: 08/29/16  5:07 AM  Result Value Ref Range   Sodium 137 135 - 145 mmol/L   Potassium 3.8 3.5 - 5.1 mmol/L   Chloride 103 101 - 111 mmol/L   CO2 25 22 - 32 mmol/L   Glucose, Bld 93 65 - 99 mg/dL   BUN 16 6 - 20 mg/dL   Creatinine, Ser 1.49 (H) 0.61 - 1.24 mg/dL   Calcium 8.6 (L) 8.9 - 10.3 mg/dL   GFR calc non Af Amer 42 (L) >60 mL/min   GFR calc Af Amer 48 (L) >60 mL/min    Comment: (NOTE) The eGFR has been calculated using the CKD EPI equation. This calculation has not been validated in all clinical situations. eGFR's persistently <60 mL/min signify possible Chronic Kidney Disease.    Anion gap 9 5 - 15     HEENT: Poor dentition, upper earlobe with dermatitis Cardio: RRR Resp: CTA  B/L and Unlabored GI: BS positive and Nontender, nondistended Extremity:  No Edema Skin:   Other Dry with poor toenail care Neuro: Lethargic, Abnormal Sensory Withdraws to pinch. Bilateral upper and lower limbs, further assessment not possible due to mental status and Abnormal Motor 4/5 BUE and BLE Right cranial nerve VI palsy , horizontal nystagmus to the left Musc/Skel:  Other No joint swelling noted in the upper or lower extremities. No pain with passive range of motion Gen. no acute distress   Assessment/Plan: 1. 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 Physiatrist is providing close team supervision and 24 hour management of active medical problems listed below. Physiatrist and rehab team continue to assess barriers to discharge/monitor patient progress toward  functional and medical goals. FIM: Function - Bathing Position: Shower Body parts bathed by patient: Right arm, Left arm, Chest, Abdomen, Right upper leg, Left upper leg, Right lower leg, Left lower leg, Buttocks, Front perineal area Body parts bathed by helper: Back Bathing not applicable: Chest, Abdomen, Right upper leg, Left upper leg, Right lower leg, Left lower leg Assist Level: Touching or steadying assistance(Pt > 75%)  Function- Upper Body Dressing/Undressing What is the patient wearing?: Pull over shirt/dress Pull over shirt/dress - Perfomed by patient: Put head through opening, Thread/unthread left sleeve, Thread/unthread right sleeve, Pull shirt over trunk Pull over shirt/dress - Perfomed by helper: Put head through opening, Pull shirt over trunk Button up shirt - Perfomed by patient: Thread/unthread left sleeve, Thread/unthread right sleeve, Pull shirt around back Button up shirt - Perfomed by helper: Button/unbutton shirt (He as able to complete 6/7 buttons) Assist Level: Supervision or verbal cues Function - Lower Body Dressing/Undressing What is the patient wearing?: Pants, Socks, Shoes Position: Wheelchair/chair at [employer] - Performed by patient: Thread/unthread right underwear leg, Thread/unthread left underwear leg, Pull underwear up/down Pants- Performed by patient: Thread/unthread right pants leg, Thread/unthread left pants leg, Pull pants up/down Pants- Performed by helper: Pull pants up/down Non-skid slipper socks- Performed by helper: Don/doff right sock, Don/doff left sock Socks - Performed by patient: Don/doff right sock, Don/doff left sock Socks - Performed by helper: Don/doff right sock, Don/doff left sock Shoes - Performed by patient: Don/doff right shoe, Don/doff left shoe, Fasten right, Fasten left Shoes - Performed by helper: Don/doff right shoe, Don/doff left shoe, Fasten right, Fasten left (therapist assist secondary to decreased time) Assist for  footwear: Supervision/touching assist Assist for lower body dressing: Touching or steadying assistance (Pt > 75%)  Function - Toileting Toileting activity did not occur: Safety/medical concerns Toileting steps completed by patient: Performs perineal hygiene, Adjust clothing after toileting, Adjust clothing prior to toileting Toileting steps completed by helper: Adjust clothing prior to toileting Toileting Assistive Devices: Grab bar or rail Assist level: Touching or steadying assistance (Pt.75%)  Function - Air cabin crew transfer activity did not occur: Safety/medical concerns Toilet transfer assistive device: Grab bar, Walker Assist level to toilet: Touching or steadying assistance (Pt > 75%) Assist level from toilet: Touching or steadying assistance (Pt > 75%)  Function - Chair/bed transfer Chair/bed transfer activity did not occur: Safety/medical concerns Chair/bed transfer method: Stand pivot Chair/bed transfer assist level: Touching or steadying assistance (Pt > 75%) Chair/bed transfer assistive device: Walker Chair/bed transfer details: Manual facilitation for weight shifting, Verbal cues for precautions/safety  Function - Locomotion: Wheelchair Type: Manual Max wheelchair distance: 250 Assist Level: Supervision or verbal cues Assist Level: Supervision or verbal  cues Assist Level: Supervision or verbal cues Turns around,maneuvers to table,bed, and toilet,negotiates 3% grade,maneuvers on rugs and over doorsills: No Function - Locomotion: Ambulation Assistive device: Walker-rolling Max distance: 248f Assist level: Touching or steadying assistance (Pt > 75%) Assist level: Supervision or verbal cues Assist level: Touching or steadying assistance (Pt > 75%) Walk 150 feet activity did not occur: Safety/medical concerns Assist level: Touching or steadying assistance (Pt > 75%) Walk 10 feet on uneven surfaces activity did not occur: Safety/medical concerns  Function -  Comprehension Comprehension: Auditory Comprehension assist level: Follows basic conversation/direction with no assist  Function - Expression Expression: Verbal Expression assist level: Expresses basic needs/ideas: With no assist  Function - Social Interaction Social Interaction assist level: Interacts appropriately 75 - 89% of the time - Needs redirection for appropriate language or to initiate interaction.  Function - Problem Solving Problem solving assist level: Solves basic 75 - 89% of the time/requires cueing 10 - 24% of the time  Function - Memory Memory assist level: Recognizes or recalls 75 - 89% of the time/requires cueing 10 - 24% of the time Patient normally able to recall (first 3 days only): Current season, Location of own room, Staff names and faces, That he or she is in a hospital  Medical Problem List and Plan: 1.  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  Cont CIR PT, OT, SLP .. , 2.  DVT Prophylaxis/Anticoagulation: Pharmaceutical: Lovenox 3. Pain Management: Tylenol prn for knee pain 4. Mood: LCSW to follow for evaluation and support.  5. Neuropsych: This patient is not fully capable of making decisions on his own behalf. 6. Skin/Wound Care: routine pressure relief  7. Fluids/Electrolytes/Nutrition: Monitor I/O.  8. PAF: Monitor HR bid.  Resting heart rate is normal at present  Vitals:   08/30/16 1425 08/31/16 0300  BP: (!) 125/59 (!) 147/66  Pulse: 76   Resp: 17 16  Temp: 97.5 F (36.4 C) 97.9 F (36.6 C)   9. HTN: Monitor BP. Cont meds, ,cont Zebeta 13m 10. Knee OA: Meds as necessary 11. Parkinson's disease: Cont to monitor 12. Urinary retention, likely multifactorial, increase  Flomax to 0.75m59m increase Urecholine 28m83mD  -ucx + 100k ecoli---continue keflex for 7 days  -may need home with foley with urology f/u                                                                                           13. Hypokalemia likely related to IVF,resolved will D/C KCL and repeat BMET shows some improvement  Creat 1.49, monitor, IVF , enc fluids, no nephrotoxic meds   LOS (Days) 19 A FACE TO FACE EVALUATION WAS PERFORMED  SWARTZ,ZACHARY T 08/31/2016, 8:23 AM

## 2016-08-31 NOTE — Progress Notes (Signed)
Occupational Therapy Session Note  Patient Details  Name: [name] MRN: [id number] Date of Birth: 11/21/1930  Today's Date: 08/31/2016 OT Individual Time: [id number] OT Individual Time Calculation (min): 47 min    Short Term Goals: Week 3:  OT Short Term Goal 1 (Week 3): Continue working on established LTGs set at supervision to min assist.    Skilled Therapeutic Interventions/Progress Updates:    Pt completed functional mobility to the therapy gym with min assist and use of the RW for support.  Once in the gym worked in standing with functional use of the LUE to play checkers with overall min guard assist.  Pt with occasional dropping of the checkers secondary to decreased FM control in the LUE.  Transitioned to having pt reach down and to the left to pick up checkers and then return to standing.  Min assist for balance when reaching with min demonstrational cueing at times to maintain left knee extension.  Completed 2 sets of 4 mins each with BUE on the UE ergonometer to complete session.  He was able to maintain RPMs at 23-25 for the entire time, with rest break of 1-2 mins in between sets.  Had him ambulate back to the room with min assist to complete session.  Pt left in wheelchair with call button and phone in reach.    Therapy Documentation Precautions:  Precautions Precautions: Fall Precaution Comments: left lean in sitting and standing Restrictions Weight Bearing Restrictions: No  Pain: Pain Assessment Pain Assessment: No/denies pain ADL: ADL ADL Comments: See Functional Tool for details  See Function Navigator for Current Functional Status.   Therapy/Group: Individual Therapy  [name] OTR/L 08/31/2016, 3:42 PM

## 2016-09-01 ENCOUNTER — Inpatient Hospital Stay (HOSPITAL_COMMUNITY): Payer: Medicare Other | Admitting: Occupational Therapy

## 2016-09-01 NOTE — Progress Notes (Signed)
Subjective/Complaints:  No new complaints. Rested last night.   ROS: pt denies nausea, vomiting, diarrhea, cough, shortness of breath or chest pain   Objective: Vital Signs: Blood pressure 120/64, pulse 75, temperature 98.6 F (37 C), temperature source Oral, resp. rate 18, height 5\' 9"  (1.753 m), weight 84.1 kg (185 lb 8.3 oz), SpO2 93 %. No results found. No results found for this or any previous visit (from the past 72 hour(s)).   HEENT: Poor dentition, upper earlobe with dermatitis Cardio: RRR Resp: CTA B GI: BS positive and Nontender, nondistended Extremity:  No Edema Skin:   Other Dry with poor toenail care Neuro: Lethargic, Abnormal Sensory Withdraws to pinch. Bilateral upper and lower limbs, further assessment not possible due to mental status and Abnormal Motor 4/5 BUE and BLE Right cranial nerve VI palsy , horizontal nystagmus to the left Musc/Skel:  Other No joint swelling noted in the upper or lower extremities. No pain with passive range of motion Gen. no acute distress   Assessment/Plan: 1. 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 Physiatrist is providing close team supervision and 24 hour management of active medical problems listed below. Physiatrist and rehab team continue to assess barriers to discharge/monitor patient progress toward functional and medical goals. FIM: Function - Bathing Position: Shower Body parts bathed by patient: Right arm, Left arm, Chest, Abdomen, Right upper leg, Left upper leg, Right lower leg, Left lower leg, Buttocks, Front perineal area Body parts bathed by helper: Back Bathing not applicable: Chest, Abdomen, Right upper leg, Left upper leg, Right lower leg, Left lower leg Assist Level: Touching or steadying assistance(Pt > 75%)  Function- Upper Body Dressing/Undressing What is the patient wearing?: Pull over  shirt/dress Pull over shirt/dress - Perfomed by patient: Put head through opening, Thread/unthread left sleeve, Thread/unthread right sleeve, Pull shirt over trunk Pull over shirt/dress - Perfomed by helper: Put head through opening, Pull shirt over trunk Button up shirt - Perfomed by patient: Thread/unthread left sleeve, Thread/unthread right sleeve, Pull shirt around back Button up shirt - Perfomed by helper: Button/unbutton shirt (He as able to complete 6/7 buttons) Assist Level: Supervision or verbal cues Function - Lower Body Dressing/Undressing What is the patient wearing?: Pants, Socks, Shoes Position: Wheelchair/chair at [employer] - Performed by patient: Thread/unthread right underwear leg, Thread/unthread left underwear leg, Pull underwear up/down Pants- Performed by patient: Thread/unthread right pants leg, Thread/unthread left pants leg, Pull pants up/down Pants- Performed by helper: Pull pants up/down Non-skid slipper socks- Performed by helper: Don/doff right sock, Don/doff left sock Socks - Performed by patient: Don/doff right sock, Don/doff left sock Socks - Performed by helper: Don/doff right sock, Don/doff left sock Shoes - Performed by patient: Don/doff right shoe, Don/doff left shoe, Fasten right, Fasten left Shoes - Performed by helper: Don/doff right shoe, Don/doff left shoe, Fasten right, Fasten left (therapist assist secondary to decreased time) Assist for footwear: Supervision/touching assist Assist for lower body dressing: Touching or steadying assistance (Pt > 75%)  Function - Toileting Toileting activity did not occur: Safety/medical concerns Toileting steps completed by patient: Performs perineal hygiene, Adjust clothing after toileting, Adjust clothing prior to toileting Toileting steps completed by helper: Adjust clothing prior to toileting Toileting Assistive Devices: Grab bar or rail Assist level: Touching or steadying assistance (Pt.75%)  Function -  Archivist transfer activity did not occur: Safety/medical concerns Toilet transfer assistive device: Grab bar, Hospital doctor level  to toilet: Touching or steadying assistance (Pt > 75%) Assist level from toilet: Touching or steadying assistance (Pt > 75%)  Function - Chair/bed transfer Chair/bed transfer activity did not occur: Safety/medical concerns Chair/bed transfer method: Stand pivot Chair/bed transfer assist level: Touching or steadying assistance (Pt > 75%) Chair/bed transfer assistive device: Walker Chair/bed transfer details: Manual facilitation for weight shifting, Verbal cues for precautions/safety  Function - Locomotion: Wheelchair Type: Manual Max wheelchair distance: 250 Assist Level: Supervision or verbal cues Assist Level: Supervision or verbal cues Assist Level: Supervision or verbal cues Turns around,maneuvers to table,bed, and toilet,negotiates 3% grade,maneuvers on rugs and over doorsills: No Function - Locomotion: Ambulation Assistive device: Walker-rolling Max distance: 260ft Assist level: Touching or steadying assistance (Pt > 75%) Assist level: Supervision or verbal cues Assist level: Touching or steadying assistance (Pt > 75%) Walk 150 feet activity did not occur: Safety/medical concerns Assist level: Touching or steadying assistance (Pt > 75%) Walk 10 feet on uneven surfaces activity did not occur: Safety/medical concerns  Function - Comprehension Comprehension: Auditory Comprehension assist level: Follows basic conversation/direction with no assist  Function - Expression Expression: Verbal Expression assist level: Expresses basic needs/ideas: With no assist  Function - Social Interaction Social Interaction assist level: Interacts appropriately 75 - 89% of the time - Needs redirection for appropriate language or to initiate interaction.  Function - Problem Solving Problem solving assist level: Solves basic 75 - 89% of the  time/requires cueing 10 - 24% of the time  Function - Memory Memory assist level: Recognizes or recalls 75 - 89% of the time/requires cueing 10 - 24% of the time Patient normally able to recall (first 3 days only): Current season, Location of own room, Staff names and faces, That he or she is in a hospital  Medical Problem List and Plan: 1.  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  Cont CIR PT, OT, SLP therapies 2.  DVT Prophylaxis/Anticoagulation: Pharmaceutical: Lovenox 3. Pain Management: Tylenol prn for knee pain 4. Mood: LCSW to follow for evaluation and support.  5. Neuropsych: This patient is not fully capable of making decisions on his own behalf. 6. Skin/Wound Care: routine pressure relief  7. Fluids/Electrolytes/Nutrition: Monitor I/O.  8. PAF: Monitor HR bid.  Resting heart rate is normal at present  Vitals:   08/31/16 1300 09/01/16 0553  BP: 115/66 120/64  Pulse: 80 75  Resp: 16 18  Temp: 97.9 F (36.6 C) 98.6 F (37 C)   9. HTN: Monitor BP. Cont meds, ,cont Zebeta 10mg   10. Knee OA: Meds as necessary 11. Parkinson's disease: Cont to monitor 12. Urinary retention, likely multifactorial, increased  Flomax to 0.8mg  , increased Urecholine 25mg  QID  -ucx + 100k ecoli---continue keflex for 7 days  -still requring intermittent I/O caths  -may need home with foley with urology f/u                                                                                          13. Hypokalemia likely related to IVF,resolved will D/C KCL and repeat BMET shows some improvement  Creat 1.49, monitor, IVF ,  enc fluids, no nephrotoxic meds   LOS (Days) 20 A FACE TO FACE EVALUATION WAS PERFORMED  SWARTZ,ZACHARY T 09/01/2016, 9:09 AM

## 2016-09-01 NOTE — Progress Notes (Signed)
Occupational Therapy Session Note  Patient Details  Name: [name] MRN: [id number] Date of Birth: Oct 07, 1930  Today's Date: 09/01/2016 OT Individual Time: 1300-1345 OT Individual Time Calculation (min): 45 min    Short Term Goals: Week 3:  OT Short Term Goal 1 (Week 3): Continue working on established LTGs set at supervision to min assist.    Skilled Therapeutic Interventions/Progress Updates: Skilled OT session completed with focus on L UE NMR, midline orientation, balance, and family education. Pt ambulated from room to dayroom with Min A. He participated in [name] activity with cognitive demands while standing on blue foam cushion with Min A. Had pt orient himself to midline by bringing shoulder to visual landmark on right (OT's hand). Pt required max cues to maintain this position and often stopped engaging in activity to focus on upright postural alignment. Max cues for using L UE for task due to R UE preference. Afterwards he ambulated back to room in manner as written above. Discussed impending d/c with family, encouraged to write down questions for therapists tomorrow. Also discussed using BSC at home due to pts tendency to void in bed at night. Spouse [name] may benefit from practicing EOB<BSC transfers with grad day therapist. Pt left with safety belt and family at time of departure.      Therapy Documentation Precautions:  Precautions Precautions: Fall Precaution Comments: left lean in sitting and standing Restrictions Weight Bearing Restrictions: No    Pain: No c/o pain during session    ADL: ADL ADL Comments: See Functional Tool for details    See Function Navigator for Current Functional Status.   Therapy/Group: Individual Therapy  [name] A Kyna Blahnik 09/01/2016, 3:49 PM

## 2016-09-02 ENCOUNTER — Inpatient Hospital Stay (HOSPITAL_COMMUNITY): Payer: Medicare Other | Admitting: Speech Pathology

## 2016-09-02 ENCOUNTER — Inpatient Hospital Stay (HOSPITAL_COMMUNITY): Payer: Medicare Other | Admitting: Occupational Therapy

## 2016-09-02 ENCOUNTER — Encounter (HOSPITAL_COMMUNITY): Payer: Self-pay | Admitting: *Deleted

## 2016-09-02 ENCOUNTER — Inpatient Hospital Stay (HOSPITAL_COMMUNITY): Payer: Medicare Other | Admitting: Physical Therapy

## 2016-09-02 LAB — CBC
HCT: 35.4 % — ABNORMAL LOW (ref 39.0–52.0)
Hemoglobin: 11.9 g/dL — ABNORMAL LOW (ref 13.0–17.0)
MCH: 31.1 pg (ref 26.0–34.0)
MCHC: 33.6 g/dL (ref 30.0–36.0)
MCV: 92.4 fL (ref 78.0–100.0)
PLATELETS: 263 10*3/uL (ref 150–400)
RBC: 3.83 MIL/uL — ABNORMAL LOW (ref 4.22–5.81)
RDW: 13.6 % (ref 11.5–15.5)
WBC: 10 10*3/uL (ref 4.0–10.5)

## 2016-09-02 LAB — BASIC METABOLIC PANEL
ANION GAP: 9 (ref 5–15)
BUN: 15 mg/dL (ref 6–20)
CALCIUM: 8.7 mg/dL — AB (ref 8.9–10.3)
CHLORIDE: 104 mmol/L (ref 101–111)
CO2: 25 mmol/L (ref 22–32)
CREATININE: 1.46 mg/dL — AB (ref 0.61–1.24)
GFR calc non Af Amer: 43 mL/min — ABNORMAL LOW (ref >60)
GFR, EST AFRICAN AMERICAN: 49 mL/min — AB (ref >60)
Glucose, Bld: 89 mg/dL (ref 65–99)
Potassium: 4 mmol/L (ref 3.5–5.1)
Sodium: 138 mmol/L (ref 135–145)

## 2016-09-02 NOTE — Discharge Instructions (Signed)
Inpatient Rehab Discharge Instructions  [name] Discharge date and time: 09/03/16   Activities/Precautions/ Functional Status: Activity: no lifting, driving, or strenuous exercise till cleared by MD.  Diet:  Heart Healthy-soft foods.  Wound Care: none needed   Functional status:  ___ No restrictions     ___ Walk up steps independently _X__ 24/7 supervision/assistance   ___ Walk up steps with assistance ___ Intermittent supervision/assistance  ___ Bathe/dress independently ___ Walk with walker     _X__ Bathe/dress with assistance ___ Walk Independently    ___ Shower independently ___ Walk with assistance    ___ Shower with assistance _X__ No alcohol     ___ Return to work/school ________  Special Instructions:    COMMUNITY REFERRALS UPON DISCHARGE:    Home Health:   PT, OT, RN, SP  Agency:[facility]   [phone]   Date of last service:09/03/2016  Medical Equipment/Items Ordered:WHEELCHAIR & TUB BENCH  Agency/Supplier:[facility]   [phone]   GENERAL COMMUNITY RESOURCES FOR PATIENT/FAMILY: Support Groups:[facility] SUPPORT GROUP EVERY SECOND Thursday @ 3:00-4:00 PM ON THE REHAB UNIT QUESTIONS CONTACT [place] [phone]   STROKE/TIA DISCHARGE INSTRUCTIONS SMOKING Cigarette smoking nearly doubles your risk of having a stroke & is the single most alterable risk factor  If you smoke or have smoked in the last 12 months, you are advised to quit smoking for your health.  Most of the excess cardiovascular risk related to smoking disappears within a year of stopping.  Ask you doctor about anti-smoking medications  [place] Quit Line: 1-800-QUIT NOW  Free Smoking Cessation Classes [phone]  CHOLESTEROL Know your levels; limit fat & cholesterol in your diet  Lipid Panel     Component Value Date/Time   CHOL 172 08/09/2016 0543   TRIG 122 08/09/2016 0543   HDL 35 (L) 08/09/2016 0543   CHOLHDL 4.9 08/09/2016 0543   VLDL 24 08/09/2016 0543   LDLCALC  113 (H) 08/09/2016 0543      Many patients benefit from treatment even if their cholesterol is at goal.  Goal: Total Cholesterol (CHOL) less than 160  Goal:  Triglycerides (TRIG) less than 150  Goal:  HDL greater than 40  Goal:  LDL (LDLCALC) less than 100   BLOOD PRESSURE American Stroke Association blood pressure target is less that 120/80 mm/Hg  Your discharge blood pressure is:  BP: 120/64  Monitor your blood pressure  Limit your salt and alcohol intake  Many individuals will require more than one medication for high blood pressure  DIABETES (A1c is a blood sugar average for last 3 months) Goal HGBA1c is under 7% (HBGA1c is blood sugar average for last 3 months)  Diabetes: No known diagnosis of diabetes    Lab Results  Component Value Date   HGBA1C 5.4 08/09/2016     Your HGBA1c can be lowered with medications, healthy diet, and exercise.  Check your blood sugar as directed by your physician  Call your physician if you experience unexplained or low blood sugars.  PHYSICAL ACTIVITY/REHABILITATION Goal is 30 minutes at least 4 days per week  Activity: No driving, Therapies: see above Return to work: N/A  Activity decreases your risk of heart attack and stroke and makes your heart stronger.  It helps control your weight and blood pressure; helps you relax and can improve your mood.  Participate in a regular exercise program.  Talk with your doctor about the best form of exercise for you (dancing, walking, swimming, cycling).  DIET/WEIGHT Goal is to maintain  a healthy weight  Your discharge diet is: DIET DYS 3 Room service appropriate? Yes; Fluid consistency: Thin  liquids Your height is:  Height: 5\' 9"  (175.3 cm) Your current weight is: Weight: 84.1 kg (185 lb 8.3 oz) Your Body Mass Index (BMI) is:  BMI (Calculated): 26.8  Following the type of diet specifically designed for you will help prevent another stroke.  Your goal weight is: 174 lbs  Your goal Body Mass  Index (BMI) is 19-24.  Healthy food habits can help reduce 3 risk factors for stroke:  High cholesterol, hypertension, and excess weight.  RESOURCES Stroke/Support Group:  Call [phone]   STROKE EDUCATION PROVIDED/REVIEWED AND GIVEN TO PATIENT Stroke warning signs and symptoms How to activate emergency medical system (call 911). Medications prescribed at discharge. Need for follow-up after discharge. Personal risk factors for stroke. Pneumonia vaccine given:  Flu vaccine given:  My questions have been answered, the writing is legible, and I understand these instructions.  I will adhere to these goals & educational materials that have been provided to me after my discharge from the hospital.     My questions have been answered and I understand these instructions. I will adhere to these goals and the provided educational materials after my discharge from the hospital.  Patient/Caregiver Signature _______________________________ Date __________  Clinician Signature _______________________________________ Date __________  Please bring this form and your medication list with you to all your follow-up doctor's appointments.

## 2016-09-02 NOTE — Progress Notes (Signed)
Physical Therapy Session Note  Patient Details  Name: [name] MRN: [id number] Date of Birth: May 06, 1931  Today's Date: 09/02/2016 PT Individual Time: 1000-1058 PT Individual Time Calculation (min): 58 min   Short Term Goals: Week 3:  PT Short Term Goal 1 (Week 3): STG =LTG due to Estimated d/c date  Skilled Therapeutic Interventions/Progress Updates: Pt presented in w/c with family present agreeable to therapy. Pt transported to ortho gym for energy conservation and time management. Performed car transfer with supervision requiring cues for hand placement for safety. Up ramp and on uneven surface with minA with son providing steadying assist on uneven surface. Performed bed mobility with mod I with additional time required. Continued reinforcement of stair training with wife and son. Pt also ascend/descend x 12 steps 2 rails for strengthening. Ambulated aprox 21700ft with RW throughout unit with supervision no LOB noted. Performed obstacle course in day room with cues for slowing down with movement.  Pt ambulated back to room and returned to w/c with QRB and chair alarm on place with family present.      Therapy Documentation Precautions:  Precautions Precautions: Fall Precaution Comments: left lean in sitting and standing Restrictions Weight Bearing Restrictions: No General:   Vital Signs:   Pain:   Mobility:   Locomotion :    Trunk/Postural Assessment :    Balance:   Exercises:   Other Treatments:     See Function Navigator for Current Functional Status.   Therapy/Group: Individual Therapy  Natahsa Marian  Jassmine Vandruff, PTA  09/02/2016, 12:12 PM

## 2016-09-02 NOTE — Progress Notes (Signed)
Physical Therapy Discharge Summary  Patient Details  Name: [name] MRN: [id number] Date of Birth: 1930-10-11  Today's Date: 09/02/2016     Patient has met 12 of 12 long term goals due to improved activity tolerance, improved balance, improved postural control, increased strength, improved awareness and improved coordination.  Patient to discharge at an ambulatory level Supervision.   Patient's care partner is independent to provide the necessary physical assistance at discharge.  Reasons goals not met: N/A  Recommendation:  Patient will benefit from ongoing skilled PT services in home health setting to continue to advance safe functional mobility, address ongoing impairments in balance, transfers, safety, strength, endurance, ataxia, coordination and minimize fall risk.  Equipment: WC.   Reasons for discharge: treatment goals met  Patient/family agrees with progress made and goals achieved: Yes  PT Discharge Precautions/Restrictions Precautions Precautions: Fall Precaution Comments: L side weakness Restrictions Weight Bearing Restrictions: No Vital Signs Therapy Vitals Temp: 98.6 F (37 C) Temp Source: Oral Pulse Rate: 89 Resp: 17 BP: (!) 134/59 Patient Position (if appropriate): Sitting Oxygen Therapy SpO2: 100 % O2 Device: Not Delivered  Cognition Overall Cognitive Status: Impaired/Different from baseline Arousal/Alertness: Awake/alert Orientation Level: Oriented X4 Attention: Sustained Focused Attention: Appears intact Sustained Attention: Impaired Sustained Attention Impairment: Functional basic Memory: Impaired Memory Impairment: Decreased recall of new information Awareness: Impaired Awareness Impairment: Emergent impairment Problem Solving: Impaired Problem Solving Impairment: Verbal complex;Functional complex Reasoning: Impaired Reasoning Impairment: Verbal complex;Functional complex Sequencing: Impaired Sequencing Impairment: Verbal  complex;Functional complex Organizing: Impaired Organizing Impairment: Verbal complex;Functional complex Decision Making: Impaired Decision Making Impairment: Verbal complex;Functional complex Initiating: Impaired Initiating Impairment: Verbal complex;Functional complex Self Monitoring: Impaired Self Monitoring Impairment: Verbal complex;Functional complex Self Correcting: Impaired Self Correcting Impairment: Verbal complex;Functional complex Behaviors: Perseveration Safety/Judgment: Impaired Sensation Sensation Light Touch: Appears Intact Coordination Gross Motor Movements are Fluid and Coordinated: No Coordination and Movement Description: Slowed movement, however, WFL Motor  Motor Motor: Hemiplegia;Abnormal postural alignment and control;Motor impersistence Motor - Discharge Observations: L side weakness, however, much improved since admission  Mobility Transfers Transfers: Yes Sit to Stand: 5: Supervision Stand to Sit: 5: Supervision Stand Pivot Transfers: 5: Supervision Locomotion  Ambulation Ambulation: Yes Ambulation/Gait Assistance: 5: Supervision Ambulation Distance (Feet): 200 Feet Assistive device: Rolling walker Gait Gait: Yes Gait Pattern: Impaired Gait Pattern: Narrow base of support;Step-through pattern Stairs / Additional Locomotion Stairs: Yes Stairs Assistance: 4: Min assist Stairs Assistance Details (indicate cue type and reason): Pt able to ambulate 12 steps 2 rails, 2 steps no rail with minA from family  Number of Stairs: 2 Height of Stairs: 6  Trunk/Postural Assessment  Cervical Assessment Cervical Assessment: Exceptions to [facility] (Flexed) Thoracic Assessment Thoracic Assessment: Exceptions to [facility] (Kyphotic) Lumbar Assessment Lumbar Assessment: Exceptions to [facility] Postural Control Postural Control: Deficits on evaluation Righting Reactions: delayed Protective Responses: delayed  Balance Balance Balance Assessed: Yes Static Sitting  Balance Static Sitting - Balance Support: Feet supported Static Sitting - Level of Assistance: 5: Stand by assistance Dynamic Sitting Balance Dynamic Sitting - Balance Support: During functional activity;Feet unsupported Dynamic Sitting - Level of Assistance: 5: Stand by assistance Static Standing Balance Static Standing - Balance Support: During functional activity;Right upper extremity supported;Left upper extremity supported Static Standing - Level of Assistance: 5: Stand by assistance Dynamic Standing Balance Dynamic Standing - Balance Support: During functional activity;Right upper extremity supported;Left upper extremity supported Dynamic Standing - Level of Assistance: 4: Min assist Dynamic Standing - Comments: Stnading to complete toileting task  Extremity Assessment  RUE Assessment RUE  Assessment: Within Functional Limits LUE Assessment LUE Assessment: Exceptions to [facility] LUE Strength LUE Overall Strength Comments: Generalized weakness, however, WFL for tasks assessed RLE Assessment RLE Assessment: Exceptions to [facility] RLE Strength RLE Overall Strength Comments: Grossly 4/5  LLE Assessment LLE Assessment: Exceptions to [facility] LLE Strength LLE Overall Strength Comments: Grossly 4/5   See Function Navigator for Current Functional Status.  [name] 09/02/2016, 4:27 PM

## 2016-09-02 NOTE — Progress Notes (Signed)
Subjective/Complaints:  Patient excited about discharge, voided well this morning, but required catheterization times 2 yesterday   ROS: pt denies nausea, vomiting, diarrhea, cough, shortness of breath or chest pain   Objective: Vital Signs: Blood pressure 120/64, pulse 62, temperature 98.6 F (37 C), temperature source Oral, resp. rate 16, height '5\' 9"'  (1.753 m), weight 84.1 kg (185 lb 8.3 oz), SpO2 94 %. No results found. Results for orders placed or performed during the hospital encounter of 08/12/16 (from the past 72 hour(s))  CBC     Status: Abnormal   Collection Time: 09/02/16  6:48 AM  Result Value Ref Range   WBC 10.0 4.0 - 10.5 K/uL   RBC 3.83 (L) 4.22 - 5.81 MIL/uL   Hemoglobin 11.9 (L) 13.0 - 17.0 g/dL   HCT 35.4 (L) 39.0 - 52.0 %   MCV 92.4 78.0 - 100.0 fL   MCH 31.1 26.0 - 34.0 pg   MCHC 33.6 30.0 - 36.0 g/dL   RDW 13.6 11.5 - 15.5 %   Platelets 263 150 - 400 K/uL  Basic metabolic panel     Status: Abnormal   Collection Time: 09/02/16  6:48 AM  Result Value Ref Range   Sodium 138 135 - 145 mmol/L   Potassium 4.0 3.5 - 5.1 mmol/L   Chloride 104 101 - 111 mmol/L   CO2 25 22 - 32 mmol/L   Glucose, Bld 89 65 - 99 mg/dL   BUN 15 6 - 20 mg/dL   Creatinine, Ser 1.46 (H) 0.61 - 1.24 mg/dL   Calcium 8.7 (L) 8.9 - 10.3 mg/dL   GFR calc non Af Amer 43 (L) >60 mL/min   GFR calc Af Amer 49 (L) >60 mL/min    Comment: (NOTE) The eGFR has been calculated using the CKD EPI equation. This calculation has not been validated in all clinical situations. eGFR's persistently <60 mL/min signify possible Chronic Kidney Disease.    Anion gap 9 5 - 15     HEENT: Poor dentition, upper earlobe with dermatitis Cardio: RRR Resp: CTA B GI: BS positive and Nontender, nondistended Extremity:  No Edema Skin:   Other Dry with poor toenail care Neuro: Lethargic, Abnormal Sensory Withdraws to pinch. Bilateral upper and lower limbs, further assessment not possible due to mental status  and Abnormal Motor 4/5 BUE and BLE Right cranial nerve VI palsy , horizontal nystagmus to the left Musc/Skel:  Other No joint swelling noted in the upper or lower extremities. No pain with passive range of motion Gen. no acute distress   Assessment/Plan: 1. 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 Physiatrist is providing close team supervision and 24 hour management of active medical problems listed below. Physiatrist and rehab team continue to assess barriers to discharge/monitor patient progress toward functional and medical goals. FIM: Function - Bathing Position: Shower Body parts bathed by patient: Right arm, Left arm, Chest, Abdomen, Right upper leg, Left upper leg, Right lower leg, Left lower leg, Buttocks, Front perineal area Body parts bathed by helper: Back Bathing not applicable: Back Assist Level: Touching or steadying assistance(Pt > 75%)  Function- Upper Body Dressing/Undressing What is the patient wearing?: Pull over shirt/dress Pull over shirt/dress - Perfomed by patient: Put head through opening, Thread/unthread left sleeve, Thread/unthread right sleeve Pull over shirt/dress - Perfomed by helper: Pull shirt over trunk Button up shirt - Perfomed by patient: Thread/unthread left sleeve, Thread/unthread right sleeve,  Pull shirt around back Button up shirt - Perfomed by helper: Button/unbutton shirt Assist Level: Supervision or verbal cues Function - Lower Body Dressing/Undressing What is the patient wearing?: Pants, Socks, Shoes Position: Wheelchair/chair at sink Underwear - Performed by patient: Thread/unthread right underwear leg, Thread/unthread left underwear leg, Pull underwear up/down Pants- Performed by patient: Thread/unthread right pants leg, Thread/unthread left pants leg, Pull pants up/down Pants- Performed by helper: Pull pants up/down Non-skid  slipper socks- Performed by helper: Don/doff right sock, Don/doff left sock Socks - Performed by patient: Don/doff right sock, Don/doff left sock Socks - Performed by helper: Don/doff right sock, Don/doff left sock Shoes - Performed by patient: Don/doff right shoe, Don/doff left shoe, Fasten right, Fasten left Shoes - Performed by helper: Don/doff right shoe, Don/doff left shoe, Fasten right, Fasten left Assist for footwear: Supervision/touching assist Assist for lower body dressing: Touching or steadying assistance (Pt > 75%)  Function - Toileting Toileting activity did not occur: Safety/medical concerns Toileting steps completed by patient: Performs perineal hygiene, Adjust clothing after toileting, Adjust clothing prior to toileting Toileting steps completed by helper: Adjust clothing prior to toileting Toileting Assistive Devices: Grab bar or rail Assist level: Touching or steadying assistance (Pt.75%)  Function - Air cabin crew transfer activity did not occur: Safety/medical concerns Toilet transfer assistive device: Grab bar, Walker Assist level to toilet: Touching or steadying assistance (Pt > 75%) Assist level from toilet: Touching or steadying assistance (Pt > 75%)  Function - Chair/bed transfer Chair/bed transfer activity did not occur: Safety/medical concerns Chair/bed transfer method: Ambulatory Chair/bed transfer assist level: Supervision or verbal cues Chair/bed transfer assistive device: Walker Chair/bed transfer details: Manual facilitation for weight shifting, Verbal cues for precautions/safety  Function - Locomotion: Wheelchair Type: Manual Max wheelchair distance: 250 Assist Level: Supervision or verbal cues Assist Level: Supervision or verbal cues Assist Level: Supervision or verbal cues Turns around,maneuvers to table,bed, and toilet,negotiates 3% grade,maneuvers on rugs and over doorsills: No Function - Locomotion: Ambulation Assistive device:  Walker-rolling Max distance: 229f Assist level: Touching or steadying assistance (Pt > 75%) Assist level: Supervision or verbal cues Assist level: Touching or steadying assistance (Pt > 75%) Walk 150 feet activity did not occur: Safety/medical concerns Assist level: Touching or steadying assistance (Pt > 75%) Walk 10 feet on uneven surfaces activity did not occur: Safety/medical concerns  Function - Comprehension Comprehension: Auditory Comprehension assist level: Follows basic conversation/direction with no assist  Function - Expression Expression: Verbal Expression assist level: Expresses basic needs/ideas: With no assist  Function - Social Interaction Social Interaction assist level: Interacts appropriately 75 - 89% of the time - Needs redirection for appropriate language or to initiate interaction.  Function - Problem Solving Problem solving assist level: Solves basic 75 - 89% of the time/requires cueing 10 - 24% of the time  Function - Memory Memory assist level: Recognizes or recalls 75 - 89% of the time/requires cueing 10 - 24% of the time Patient normally able to recall (first 3 days only): Current season, Location of own room, Staff names and faces, That he or she is in a hospital  Medical Problem List and Plan: 1.  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  Cont CIR PT, OT, SLP therapies, planned discharge in a.m. 2.  DVT Prophylaxis/Anticoagulation: Pharmaceutical: Lovenox 3. Pain Management: Tylenol prn for knee pain 4. Mood: LCSW to follow for evaluation and support.  5. Neuropsych: This patient is not fully capable of making decisions on  his own behalf. 6. Skin/Wound Care: routine pressure relief  7. Fluids/Electrolytes/Nutrition: Monitor I/O.  8. PAF: Monitor HR bid.  Resting heart rate is normal at present  Vitals:   09/01/16 0553 09/02/16 0537  BP: 120/64 120/64  Pulse: 75 62  Resp: 18 16  Temp: 98.6  F (37 C) 98.6 F (37 C)   9. HTN: Monitor BP. Cont meds, ,cont Zebeta 37m  10. Knee OA: Meds as necessary 11. Parkinson's disease: Cont to monitor 12. Urinary retention, likely multifactorial, increased  Flomax to 0.870m, increased Urecholine 2536mID  -ucx + 100k ecoli---continue keflex for 7 days  -still requring intermittent I/O caths, ICP x 2 on 3/25  -may need home with foley with urology f/u                                                                                          13. Hypokalemia likely related to IVF,resolved will D/C KCL and repeat BMET shows some improvement  Creat 1.49, this is likely within baseline range, discontinue IV   LOS (Days) 21 A FACE TO FACE EVALUATION WAS PERFORMED  KIRSTEINS,ANDREW E 09/02/2016, 10:57 AM

## 2016-09-02 NOTE — Progress Notes (Deleted)
Occupational Therapy Discharge Summary  Patient Details  Name: [name] MRN: [id number] Date of Birth: 1931/01/22  Today's Date: 09/02/2016       Reasons goals not met: Recommendation:  Patient/family agrees with progress made and goals achieve  OT Discharge Precautions/Restrictions    General   Vital Signs Therapy Vitals Temp: 98.6 F (37 C) Temp Source: Oral Pulse Rate: 62 Resp: 16 BP: 120/64 Patient Position (if appropriate): Lying Oxygen Therapy SpO2: 94 % O2 Device: Not Delivered Pain   ADL ADL ADL Comments: See Functional Tool for details Vision/Perception     Cognition   Sensation   Motor    Mobility     Trunk/Postural Assessment     Balance   Extremity/Trunk Assessment       See Function Navigator for Current Functional Status.  Lisa Roca 09/02/2016, 7:49 AM

## 2016-09-02 NOTE — Progress Notes (Signed)
Occupational Therapy Discharge Summary  Patient Details  Name: [name] MRN: [id number] Date of Birth: 1931-05-09    Patient has met 65 of 15 long term goals due to improved activity tolerance, improved balance, postural control, functional use of  LEFT upper extremity, improved attention and improved coordination.  Patient to discharge at [facility] Assist level.  Patient's care partner is independent to provide the necessary physical assistance at discharge.   Family education completed with pt's wife and her son plans to assist at home for one week prior to returning to work until pt's wife is comfortable providing needed assist at d/c.    Recommendation:  Patient will benefit from ongoing skilled OT services in home health setting to continue to advance functional skills in the area of BADL and Reduce care partner burden.  Equipment: tub transfer bench  Reasons for discharge: treatment goals met and discharge from hospital  Patient/family agrees with progress made and goals achieved: Yes  OT Discharge Precautions/Restrictions  Precautions Precautions: Fall Precaution Comments: left lean in sitting and standing Restrictions Weight Bearing Restrictions: No ADL ADL ADL Comments: See Functional Tool for details Vision/Perception  Vision- History Baseline Vision/History: Wears glasses Wears Glasses: Reading only  Cognition Overall Cognitive Status: Impaired/Different from baseline Arousal/Alertness: Awake/alert Orientation Level: Oriented X4 Attention: Sustained Focused Attention: Appears intact Sustained Attention: Impaired Sustained Attention Impairment: Functional basic Memory: Impaired Memory Impairment: Decreased recall of new information Awareness: Impaired Awareness Impairment: Emergent impairment Problem Solving: Impaired Problem Solving Impairment: Verbal complex;Functional complex Reasoning: Impaired Reasoning Impairment: Verbal complex;Functional  complex Sequencing: Impaired Sequencing Impairment: Verbal complex;Functional complex Organizing: Impaired Organizing Impairment: Verbal complex;Functional complex Decision Making: Impaired Decision Making Impairment: Verbal complex;Functional complex Initiating: Impaired Initiating Impairment: Verbal complex;Functional complex Self Monitoring: Impaired Self Monitoring Impairment: Verbal complex;Functional complex Self Correcting: Impaired Self Correcting Impairment: Verbal complex;Functional complex Behaviors: Perseveration Safety/Judgment: Impaired Sensation Sensation Light Touch: Appears Intact Coordination Gross Motor Movements are Fluid and Coordinated: No Coordination and Movement Description: Slowed movement, however, WFL Motor  Motor Motor: Hemiplegia;Abnormal postural alignment and control;Motor impersistence Motor - Discharge Observations: L side weakness, however, much improved since admission Trunk/Postural Assessment  Cervical Assessment Cervical Assessment: Exceptions to [facility] (Flexed) Thoracic Assessment Thoracic Assessment: Exceptions to [facility] (Kyphotic) Lumbar Assessment Lumbar Assessment: Exceptions to [facility] (Posterior pelvic tilt) Postural Control Postural Control: Deficits on evaluation  Balance Balance Balance Assessed: Yes Static Sitting Balance Static Sitting - Balance Support: Feet supported Static Sitting - Level of Assistance: 5: Stand by assistance Dynamic Sitting Balance Dynamic Sitting - Balance Support: During functional activity;Feet unsupported Dynamic Sitting - Level of Assistance: 5: Stand by assistance Static Standing Balance Static Standing - Balance Support: During functional activity;Right upper extremity supported;Left upper extremity supported Static Standing - Level of Assistance: 5: Stand by assistance Dynamic Standing Balance Dynamic Standing - Balance Support: During functional activity;Right upper extremity supported;Left upper  extremity supported Dynamic Standing - Level of Assistance: 4: Min assist Dynamic Standing - Comments: Stnading to complete toileting task  Extremity/Trunk Assessment RUE Assessment RUE Assessment: Within Functional Limits LUE Assessment LUE Assessment: Exceptions to [facility] LUE Strength LUE Overall Strength Comments: Generalized weakness, however, WFL for tasks assessed   See Function Navigator for Current Functional Status.  Bobby Rumpf, Tona Qualley C 09/02/2016, 2:58 PM

## 2016-09-02 NOTE — Progress Notes (Signed)
Occupational Therapy Session Note  Patient Details  Name: [name] MRN: [id number] Date of Birth: [birth year]  Today's Date: 09/02/2016 OT Individual Time: 1400-1435 OT Individual Time Calculation (min): 35 min    Short Term Goals: Week 3:  OT Short Term Goal 1 (Week 3): Continue working on established LTGs set at supervision to min assist.    Skilled Therapeutic Interventions/Progress Updates:    Pt seen for OT session focusing on caregiver training and functional transfers. Pt on toilet upon arrival with wife present assisting. Steadying assist provided from wife while pt completed toileting task. He self propelled w/c to therapy day room. Completed x 3 transfers to low soft surface couch with therapist providing min A on initial trial and wife assisting on last two trials. Educated regarding how to assist pt if unable to stand from low surface, including use of "rocker" technique and "nose over toes".  Pt ambulated back to room at end of session, left seated in w/c with wife present and QRB donned. Pt and wife voiced feeling comfortable and confident with planned d/c home tomorrow.   Therapy Documentation Precautions:  Precautions Precautions: Fall Precaution Comments: left lean in sitting and standing Restrictions Weight Bearing Restrictions: No Pain:   No/ denies pain ADL: ADL ADL Comments: See Functional Tool for details  See Function Navigator for Current Functional Status.   Therapy/Group: Individual Therapy  Lewis, Allyna Pittsley C 09/02/2016, 2:53 PM

## 2016-09-02 NOTE — Progress Notes (Signed)
Speech Language Pathology Discharge Summary  Patient Details  Name: [name] MRN: [id number] Date of Birth: 04-17-1931  Today's Date: 09/02/2016 SLP Individual Time: 1030-1100 SLP Individual Time Calculation (min): 30 min   Skilled Therapeutic Interventions:  Skilled treatment session focused on completion of caregiver education. All questions answered to pt and wife's satisfaction, strategies reviewed of current diet textures and cognitive deficits/compensatory strategies reviewed.     Patient has met 6 of 6 long term goals.  Patient to discharge at [facility] level.   Clinical Impression/Discharge Summary:   Pt has reached Min Assist level and is discharging home with wife and son for continuation of strategies. Pt has made progress throughout skilled ST services and as a result has met 6 of 6 goals.   Care Partner:  Caregiver Able to Provide Assistance: Yes  Type of Caregiver Assistance: Physical;Cognitive  Recommendation:  24 hour supervision/assistance;Home Health SLP;Outpatient SLP  Rationale for SLP Follow Up: Maximize functional communication;Maximize cognitive function and independence;Maximize swallowing safety;Reduce caregiver burden   Equipment: N/A   Reasons for discharge: Treatment goals met   Patient/Family Agrees with Progress Made and Goals Achieved: Yes   Function:    Cognition Comprehension Comprehension assist level: Follows basic conversation/direction with no assist  Expression   Expression assist level: Expresses basic needs/ideas: With no assist  Social Interaction Social Interaction assist level: Interacts appropriately 90% of the time - Needs monitoring or encouragement for participation or interaction.  Problem Solving Problem solving assist level: Solves basic 90% of the time/requires cueing < 10% of the time  Memory Memory assist level: Recognizes or recalls 90% of the time/requires cueing < 10% of the time   Leonel Mccollum B. Rutherford Nail, M.S.,  CCC-SLP Speech-Language Pathologist  Jehiel Koepp Twin 09/02/2016, 1:43 PM

## 2016-09-02 NOTE — Progress Notes (Signed)
Occupational Therapy Session Note  Patient Details  Name: [name] MRN: [id number] Date of Birth: 13-Apr-1931  Today's Date: 09/02/2016 OT Individual Time:  -   0750-0900   (70 min)      Short Term Goals: Week 1:  OT Short Term Goal 1 (Week 1): Pt will maintain dynamic sitting balance during ADL tasks with no more than min assist.  OT Short Term Goal 1 - Progress (Week 1): Met OT Short Term Goal 2 (Week 1): Pt will complete UB dressing with min assist sitting unsupported.  OT Short Term Goal 2 - Progress (Week 1): Not met OT Short Term Goal 3 (Week 1): Pt will complete LB bathing with mod assist sit to stand.   OT Short Term Goal 3 - Progress (Week 1): Met OT Short Term Goal 4 (Week 1): Pt will complete LB dressing with mod assist sit to stand.   OT Short Term Goal 4 - Progress (Week 1): Met OT Short Term Goal 5 (Week 1): Pt will complete toilet transfer with RW to 3:1 with mod assist 2 consecutive trials.  OT Short Term Goal 5 - Progress (Week 1): Met Week 2:  OT Short Term Goal 1 (Week 2): Pt will complete UB dressing with min assist sitting unsupported.  OT Short Term Goal 1 - Progress (Week 2): Met OT Short Term Goal 2 (Week 2): Pt will complete LB dressing sit to stand with min assist for 2 consecutive sessions. OT Short Term Goal 2 - Progress (Week 2): Met OT Short Term Goal 3 (Week 2): Pt will complete walk-in shower transfers with min assist using the RW.  OT Short Term Goal 3 - Progress (Week 2): Met OT Short Term Goal 4 (Week 2): Pt will maintain standing balance with LB selfcare with min assist and no more than mod instructional cueing for midline orientation.   OT Short Term Goal 4 - Progress (Week 2): Met OT Short Term Goal 5 (Week 2): Pt will complete toilet transfers with min assist to 3:1 with RW.  OT Short Term Goal 5 - Progress (Week 2): Met Week 3:  OT Short Term Goal 1 (Week 3): Continue working on established LTGs set at supervision to min assist.    Skilled  Therapeutic Interventions/Progress Updates:  Pt ambulated from bed to toilet with RW and min assist plus cues for upright posture.  Pt performed self bathing with assist only for back.  Pt ambulated back to wc and completed dressing with increased time and assist to pull down in  back. Left pt in wc with safety belt on and all needs in reach. Therapy Documentation Precautions:  Precautions Precautions: Fall Precaution Comments: left lean in sitting and standing Restrictions Weight Bearing Restrictions: No    Vital Signs: Therapy Vitals Temp: 98.6 F (37 C) Temp Source: Oral Pulse Rate: 62 Resp: 16 BP: 120/64 Patient Position (if appropriate): Lying Oxygen Therapy SpO2: 94 % O2 Device: Not Delivered Pain:none   ADL: ADL ADL Comments: See Functional Tool for details Exercises:   Other Treatments:    See Function Navigator for Current Functional Status.   Therapy/Group: Individual Therapy  Lisa Roca 09/02/2016, 8:24 AM

## 2016-09-03 DIAGNOSIS — B9620 Unspecified Escherichia coli [E. coli] as the cause of diseases classified elsewhere: Secondary | ICD-10-CM

## 2016-09-03 DIAGNOSIS — R339 Retention of urine, unspecified: Secondary | ICD-10-CM

## 2016-09-03 DIAGNOSIS — N390 Urinary tract infection, site not specified: Secondary | ICD-10-CM

## 2016-09-03 MED ORDER — SENNOSIDES-DOCUSATE SODIUM 8.6-50 MG PO TABS: 2.0000 | ORAL_TABLET | Freq: Every day | ORAL | 0 refills | Status: AC

## 2016-09-03 MED ORDER — CEPHALEXIN 250 MG PO CAPS: 250.0000 mg | ORAL_CAPSULE | Freq: Three times a day (TID) | ORAL | 0 refills | Status: DC

## 2016-09-03 MED ORDER — TAMSULOSIN HCL 0.4 MG PO CAPS: 0.8000 mg | ORAL_CAPSULE | Freq: Every day | ORAL | 1 refills | Status: DC

## 2016-09-03 MED ORDER — BISOPROLOL FUMARATE 10 MG PO TABS: 10.0000 mg | ORAL_TABLET | Freq: Every day | ORAL | 0 refills | Status: DC

## 2016-09-03 MED ORDER — BETHANECHOL CHLORIDE 25 MG PO TABS: 25.0000 mg | ORAL_TABLET | Freq: Three times a day (TID) | ORAL | 1 refills | Status: DC

## 2016-09-03 NOTE — Progress Notes (Signed)
Social Work  Discharge Note  The overall goal for the admission was met for:   Discharge location: Yes-HOME WITH WIFE AND SON TO PROVIDE 24 HR CARE  Length of Stay: Yes-22 DAYS  Discharge activity level: Yes-SUPERVISION-MIN ASSIST LEVEL  Home/community participation: Yes  Services provided included: MD, RD, PT, OT, SLP, RN, CM, TR, Pharmacy and SW  Financial Services: Medicare and Private Insurance: [place]  Follow-up services arranged: Home Health: [facility]-PT,OT,RN,SP, DME: [place] and Patient/Family has no preference for HH/DME agencies  Comments (or additional information):WIFE AND SON WERE HERE DAILY AND PARTICIPATED IN PT'S CARE, [employer].  Patient/Family verbalized understanding of follow-up arrangements: Yes  Individual responsible for coordination of the follow-up plan: WIFE AND SON  Confirmed correct DME delivered: Elease Hashimoto 09/03/2016    Elease Hashimoto

## 2016-09-03 NOTE — Progress Notes (Signed)
Patient discharged to home with wife and son with all belongings and 4 cath kits. Patient and family verbalized understanding of discharge instructions given and reviewed by P. Love, PA. [name] son demonstrated in and out catherization on patient prior to discharge. Continue with plan of care. [name], [name]

## 2016-09-03 NOTE — Discharge Summary (Signed)
Physician Discharge Summary  Patient ID: [name] MRN: [id number] DOB/AGE: Nov 22, 1930 83 y.o.  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 coli UTI   Discharged Condition: stable   Significant Diagnostic Studies: Dg Chest 2 View  Result Date: 08/13/2016 CLINICAL DATA:  Elevated white blood cell count EXAM: CHEST  2 VIEW COMPARISON:  Chest x-ray of 09/05/2015 FINDINGS: There is little change in coarse markings diffusely throughout the lungs. No definite pleural effusion is seen and no fluid is noted within fissures. With little interval change in these prominent markings compared to a chest x-ray for 1 year ago, these changes most likely are chronic in nature. No definite congestive heart failure is seen although superimposed pneumonia would be difficult exclude. No bony abnormality is seen. IMPRESSION: Little change in prominent coarse lung markings most consistent with chronic fibrotic change. Difficult to exclude superimposed pneumonia. Electronically Signed   By: Dwyane Dee M.D.   On: 08/13/2016 08:08   Ct Head Wo Contrast  Result Date: 08/09/2016 CLINICAL DATA:  Follow-up intracranial hemorrhage. EXAM: CT HEAD WITHOUT CONTRAST TECHNIQUE: Contiguous axial images were obtained from the base of the skull through the vertex without intravenous contrast. COMPARISON:  CT HEAD August 08, 2016 FINDINGS: BRAIN: 8 x 12 mm RIGHT midbrain/pontine hemorrhage is relatively unchanged, no significant edema. RIGHT frontal/basal ganglia encephalomalacia with ex vacuo dilatation RIGHT lateral ventricle. Old RIGHT thalamus infarct. Old bilateral cerebellar infarcts. Old LEFT basal ganglia lacunar infarcts. Patchy to  confluent supratentorial white matter hypodensities exclusive of the aforementioned abnormality compatible with moderate chronic small vessel ischemic disease. No acute large vascular territory infarct. No midline shift or mass effect. No abnormal extra-axial fluid collections. Basal cisterns are patent. VASCULAR: Moderate to severe calcific atherosclerosis of the carotid siphons. SKULL: No skull fracture. No significant scalp soft tissue swelling. SINUSES/ORBITS: Severe chronic LEFT sphenoid sinusitis. Scattered mucosal retention cyst. Imaged mastoid air cells are well aerated. Status post bilateral ocular lens implants. The included ocular globes and orbital contents are non-suspicious. OTHER: None. IMPRESSION: Evolving acute small pontine hemorrhage. Multiple old large territory and small vessel infarcts. Electronically Signed   By: Awilda Metro M.D.   On: 08/09/2016 05:04        Labs:  Basic Metabolic Panel: BMP Latest Ref Rng & Units 09/02/2016 08/29/2016 08/26/2016  Glucose 65 - 99 mg/dL 89 93 99  BUN 6 - 20 mg/dL 15 16 20   Creatinine 0.61 - 1.24 mg/dL 4.09(W) 1.19(J) 4.78(G)  Sodium 135 - 145 mmol/L 138 137 136  Potassium 3.5 - 5.1 mmol/L 4.0 3.8 4.4  Chloride 101 - 111 mmol/L 104 103 101  CO2 22 - 32 mmol/L 25 25 25   Calcium 8.9 - 10.3 mg/dL 9.5(A) 2.1(H) 0.8(M)    CBC: CBC Latest Ref Rng & Units 09/02/2016 08/26/2016 08/19/2016  WBC 4.0 - 10.5 K/uL 10.0 17.0(H) 13.0(H)  Hemoglobin 13.0 - 17.0 g/dL 11.9(L) 12.8(L) 13.1  Hematocrit 39.0 - 52.0 % 35.4(L) 38.1(L) 38.2(L)  Platelets 150 - 400 K/uL 263 256 235    CBG:  Recent Labs Lab 08/27/16 1156  GLUCAP 117*    Brief HPI:   [name] a 83 y.o.LH-malewith history of HTN, PAF, CVA '97, Parkinson's disease, Knee OA; who  was admitted on 08/08/16 with left sided weakness and left gaze preference. CT head reviewed, showing small hemorrhage in right pons and stroke felt to be hypertensive in origin. Follow up CCT stable.  He had issues with confusion and hallucinations.   Therapy initiated and patient with resultant delayed processing with cognitive deficits, significant posterior lean, right inattention with imbalance and inability to carry out ADL tasks. CIR recommended for follow up therapy.   Hospital Course: [name] was admitted to rehab 08/12/2016 for inpatient therapies to consist of PT, ST and OT at least three hours five days a week. Past admission physiatrist, therapy team and rehab RN have worked together to provide customized collaborative inpatient rehab. He was found to have acute renal failure at admission and was hydrated with IVF. Hypokalemia due to IVF has resolved with supplementation. CBC at admission revealed leucocytosis due  UTI and he was treated with 7 day course of antibiotic. Reactive leucocytosis has resolved.  Blood pressures have been monitored on bid basis and Zebeta was titrated to 10 mg for tighter control.   His po intake has been good and follow up lytes shows renal status is back to baseline. He has had issues with urinary retention and was started on Flomax as well as urecholine to help with voiding function.  His family reported issues with frequency and hesitancy PTA and this has been exacerbated by CVA.  He currently voiding in part due to overflow and PVRs have ranged from 130-400 cc. Repeat UCS 3/19 showed recurrent UTI due to E coli and he was started on Keflex for 7 day course treatment. They has been educated on I/O caths qid to keep bladder decompressed and are to follow up with urology after discharge.  He has made good gains during his rehab stay and is at supervision to min assist level. He will continue to receive follow up HHPT, HHOT, HHST and [facility].    Rehab course: During patient's stay in rehab weekly team conferences were held to monitor patient's progress, set goals and discuss barriers to discharge. At admission, patient requires  max assist with mobility and total assist with basic self-care tasks. He required max multimodal cues for problems solving with decreased attention affecting cognition and basic tasks. He has had improvement in activity tolerance, balance, postural control, as well as ability to compensate for deficits. He is has had improvement in functional use LUE  and LLE as well as improved awareness. He is able to complete ADL tasks with min assist. He is able to transfer with supervision and is ambulating 200; with RW and supervision. He requires min assist for basic problem solving and recall. Family education was completed regarding all aspects of care.    Disposition: Home  Diet: Heart Healthy.   Special Instructions: 1. Toilet every 3-4 hours. Cath qid to keep bladder volumes , 350 cc.  Discharge Instructions    Ambulatory referral to Physical Medicine Rehab    Complete by:  As directed    1-2 weeks transitional care appt     Allergies as of 09/03/2016      Reactions   Phenytoin Sodium Extended Other (See Comments)   Fever    Statins Other (See Comments)   Muscle pain      Medication List    STOP taking these medications   aspirin EC 325 MG tablet   bisoprolol-hydrochlorothiazide 5-6.25 MG tablet Commonly known as:  ZIAC   docusate sodium 100  MG capsule Commonly known as:  COLACE   GLUCOSAMINE PO   Saw Palmetto Caps   torsemide 20 MG tablet Commonly known as:  DEMADEX     TAKE these medications   acetaminophen 325 MG tablet Commonly known as:  TYLENOL Take 325-650 mg by mouth every 6 (six) hours as needed (for pain or headaches).   bethanechol 25 MG tablet Commonly known as:  URECHOLINE Take 1 tablet (25 mg total) by mouth 3 (three) times daily.   bisoprolol 10 MG tablet Commonly known as:  ZEBETA Take 1 tablet (10 mg total) by mouth daily.   calcium carbonate 500 MG chewable tablet Commonly known as:  TUMS - dosed in mg elemental calcium Chew 2 tablets by mouth 3  (three) times daily after meals.   cephALEXin 250 MG capsule Commonly known as:  KEFLEX Take 1 capsule (250 mg total) by mouth every 8 (eight) hours.   senna-docusate 8.6-50 MG tablet Commonly known as:  Senokot-S Take 2 tablets by mouth at bedtime.   tamsulosin 0.4 MG Caps capsule Commonly known as:  FLOMAX Take 2 capsules (0.8 mg total) by mouth daily after supper.      Follow-up Information    Erick Colace, MD Follow up.   Specialty:  Physical Medicine and Rehabilitation Why:  office will call you with  follow up appointment Contact information: [address] [place] [address] [phone]        Xu,Jindong, MD. Call in 1 day(s).   Specialty:  Neurology Why:  for follow up appointment Contact information: [address] [place] [address] [phone]        Feliciana Rossetti, MD Follow up on 09/12/2016.   Specialty:  Internal Medicine Why:  Appointment @ 3;20 PM Contact information: [address] [place] [address] [phone]           Signed: Jacquelynn Cree 09/03/2016, 9:00 AM

## 2016-09-03 NOTE — Progress Notes (Signed)
Subjective/Complaints:  cathed only at midnite over the last 24 hrs Does better when he stands to avoid  ROS: pt denies nausea, vomiting, diarrhea, cough, shortness of breath or chest pain   Objective: Vital Signs: Blood pressure (!) 141/74, pulse 65, temperature 98.4 F (36.9 C), temperature source Oral, resp. rate 18, height _0  (1.753 m), weight 84.1 kg (185 lb 8.3 oz), SpO2 100 %. No results found. Results for orders placed or performed during the hospital encounter of 08/12/16 (from the past 72 hour(s))  CBC     Status: Abnormal   Collection Time: 09/02/16  6:48 AM  Result Value Ref Range   WBC 10.0 4.0 - 10.5 K/uL   RBC 3.83 (L) 4.22 - 5.81 MIL/uL   Hemoglobin 11.9 (L) 13.0 - 17.0 g/dL   HCT 35.4 (L) 39.0 - 52.0 %   MCV 92.4 78.0 - 100.0 fL   MCH 31.1 26.0 - 34.0 pg   MCHC 33.6 30.0 - 36.0 g/dL   RDW 13.6 11.5 - 15.5 %   Platelets 263 150 - 400 K/uL  Basic metabolic panel     Status: Abnormal   Collection Time: 09/02/16  6:48 AM  Result Value Ref Range   Sodium 138 135 - 145 mmol/L   Potassium 4.0 3.5 - 5.1 mmol/L   Chloride 104 101 - 111 mmol/L   CO2 25 22 - 32 mmol/L   Glucose, Bld 89 65 - 99 mg/dL   BUN 15 6 - 20 mg/dL   Creatinine, Ser 1.46 (H) 0.61 - 1.24 mg/dL   Calcium 8.7 (L) 8.9 - 10.3 mg/dL   GFR calc non Af Amer 43 (L) >60 mL/min   GFR calc Af Amer 49 (L) >60 mL/min    Comment: (NOTE) The eGFR has been calculated using the CKD EPI equation. This calculation has not been validated in all clinical situations. eGFR's persistently <60 mL/min signify possible Chronic Kidney Disease.    Anion gap 9 5 - 15     HEENT: Poor dentition, upper earlobe with dermatitis Cardio: RRR Resp: CTA B GI: BS positive and Nontender, nondistended Extremity:  No Edema Skin:   Other Dry with poor toenail care Neuro: Lethargic, Abnormal Sensory Withdraws to pinch. Bilateral upper and lower limbs, further assessment not possible due to mental status and Abnormal Motor  4/5 BUE and BLE Right cranial nerve VI palsy , horizontal nystagmus to the left Musc/Skel:  Other No joint swelling noted in the upper or lower extremities. No pain with passive range of motion Gen. no acute distress   Assessment/Plan: 1. 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  Stable for D/C today F/u PCP in 3-4 weeks F/u PM&R 2 weeks See D/C summary See D/C instructions FIM: Function - Bathing Position: Shower Body parts bathed by patient: Right arm, Left arm, Chest, Abdomen, Right upper leg, Left upper leg, Right lower leg, Left lower leg, Buttocks, Front perineal area Body parts bathed by helper: Back Bathing not applicable: Back Assist Level: Touching or steadying assistance(Pt > 75%)  Function- Upper Body Dressing/Undressing What is the patient wearing?: Pull over shirt/dress Pull over shirt/dress - Perfomed by patient: Put head through opening, Thread/unthread left sleeve, Thread/unthread right sleeve Pull over shirt/dress - Perfomed by helper: Pull shirt over trunk Button up shirt - Perfomed by patient: Thread/unthread left sleeve, Thread/unthread right sleeve, Pull shirt around back Button up shirt - Perfomed by helper: Button/unbutton shirt Assist Level: Supervision or verbal cues Function - Lower Body Dressing/Undressing What is  the patient wearing?: Pants, Socks, Shoes Position: Wheelchair/chair at [employer] - Performed by patient: Thread/unthread right underwear leg, Thread/unthread left underwear leg, Pull underwear up/down Pants- Performed by patient: Thread/unthread right pants leg, Thread/unthread left pants leg, Pull pants up/down Pants- Performed by helper: Pull pants up/down Non-skid slipper socks- Performed by helper: Don/doff right sock, Don/doff left sock Socks - Performed by patient: Don/doff right sock, Don/doff left sock Socks - Performed by helper: Don/doff right sock, Don/doff left sock Shoes - Performed by patient: Don/doff right  shoe, Don/doff left shoe, Fasten right, Fasten left Shoes - Performed by helper: Don/doff right shoe, Don/doff left shoe, Fasten right, Fasten left Assist for footwear: Supervision/touching assist Assist for lower body dressing: Touching or steadying assistance (Pt > 75%)  Function - Toileting Toileting activity did not occur: Safety/medical concerns Toileting steps completed by patient: Adjust clothing prior to toileting, Performs perineal hygiene, Adjust clothing after toileting Toileting steps completed by helper: Adjust clothing prior to toileting Toileting Assistive Devices: Grab bar or rail Assist level: Touching or steadying assistance (Pt.75%)  Function - Air cabin crew transfer activity did not occur: Safety/medical concerns Toilet transfer assistive device: Grab bar Assist level to toilet: Touching or steadying assistance (Pt > 75%) Assist level from toilet: Touching or steadying assistance (Pt > 75%)  Function - Chair/bed transfer Chair/bed transfer activity did not occur: Safety/medical concerns Chair/bed transfer method: Ambulatory Chair/bed transfer assist level: Supervision or verbal cues Chair/bed transfer assistive device: Walker Chair/bed transfer details: Manual facilitation for weight shifting, Verbal cues for precautions/safety  Function - Locomotion: Wheelchair Type: Manual Max wheelchair distance: 250 Assist Level: Supervision or verbal cues Assist Level: Supervision or verbal cues Assist Level: Supervision or verbal cues Turns around,maneuvers to table,bed, and toilet,negotiates 3% grade,maneuvers on rugs and over doorsills: No Function - Locomotion: Ambulation Assistive device: Walker-rolling Max distance: 200 ft  Assist level: Touching or steadying assistance (Pt > 75%) Assist level: Supervision or verbal cues Assist level: Touching or steadying assistance (Pt > 75%) Walk 150 feet activity did not occur: Safety/medical concerns Assist level:  Touching or steadying assistance (Pt > 75%) Walk 10 feet on uneven surfaces activity did not occur: Safety/medical concerns Assist level: Touching or steadying assistance (Pt > 75%)  Function - Comprehension Comprehension: Auditory Comprehension assist level: Follows basic conversation/direction with no assist  Function - Expression Expression: Verbal Expression assist level: Expresses basic needs/ideas: With no assist  Function - Social Interaction Social Interaction assist level: Interacts appropriately 90% of the time - Needs monitoring or encouragement for participation or interaction.  Function - Problem Solving Problem solving assist level: Solves basic 90% of the time/requires cueing < 10% of the time  Function - Memory Memory assist level: Recognizes or recalls 90% of the time/requires cueing < 10% of the time Patient normally able to recall (first 3 days only): Current season, Location of own room, Staff names and faces, That he or she is in a hospital  Medical Problem List and Plan: 1.  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  Cont CIR PT, OT, SLP therapies,  2.  DVT Prophylaxis/Anticoagulation: Pharmaceutical: Lovenox 3. Pain Management: Tylenol prn for knee pain 4. Mood: LCSW to follow for evaluation and support.  5. Neuropsych: This patient is not fully capable of making decisions on his own behalf. 6. Skin/Wound Care: routine pressure relief  7. Fluids/Electrolytes/Nutrition: Monitor I/O.  8. PAF: Monitor HR bid.  Resting heart rate is normal at present  Vitals:   09/02/16 1312 09/03/16 0544  BP: (!) 134/59 (!) 141/74  Pulse: 89 65  Resp: 17 18  Temp: 98.6 F (37 C) 98.4 F (36.9 C)   9. HTN: Monitor BP. Cont meds, ,cont Zebeta 8m  10. Knee OA: Meds as necessary 11. Parkinson's disease: Cont to monitor 12. Urinary retention, likely multifactorial, increased  Flomax to 0.835m, increased Urecholine 2575mQID  -ucx + 100k ecoli---continue keflex for 7 days  -only occ cath,But will train family prior to discharge expect improvement after UTI txed                                                                           LOS (Days) 22 [place]3/27/2018, 6:15 AM

## 2016-09-10 ENCOUNTER — Encounter: Payer: Medicare Other | Attending: Physical Medicine & Rehabilitation

## 2016-09-10 ENCOUNTER — Ambulatory Visit (HOSPITAL_BASED_OUTPATIENT_CLINIC_OR_DEPARTMENT_OTHER): Payer: Medicare Other | Admitting: Physical Medicine & Rehabilitation

## 2016-09-10 ENCOUNTER — Encounter: Payer: Self-pay | Admitting: Physical Medicine & Rehabilitation

## 2016-09-10 VITALS — BP 120/72 | HR 63 | Resp 14

## 2016-09-10 DIAGNOSIS — K5900 Constipation, unspecified: Secondary | ICD-10-CM | POA: Diagnosis not present

## 2016-09-10 DIAGNOSIS — R339 Retention of urine, unspecified: Secondary | ICD-10-CM | POA: Insufficient documentation

## 2016-09-10 DIAGNOSIS — Z5189 Encounter for other specified aftercare: Secondary | ICD-10-CM | POA: Insufficient documentation

## 2016-09-10 DIAGNOSIS — I613 Nontraumatic intracerebral hemorrhage in brain stem: Secondary | ICD-10-CM | POA: Diagnosis not present

## 2016-09-10 DIAGNOSIS — G20 Parkinson's disease: Secondary | ICD-10-CM | POA: Diagnosis not present

## 2016-09-10 DIAGNOSIS — R269 Unspecified abnormalities of gait and mobility: Secondary | ICD-10-CM | POA: Diagnosis present

## 2016-09-10 DIAGNOSIS — I69354 Hemiplegia and hemiparesis following cerebral infarction affecting left non-dominant side: Secondary | ICD-10-CM | POA: Insufficient documentation

## 2016-09-10 DIAGNOSIS — I10 Essential (primary) hypertension: Secondary | ICD-10-CM | POA: Diagnosis not present

## 2016-09-10 DIAGNOSIS — S06389S Contusion, laceration, and hemorrhage of brainstem with loss of consciousness of unspecified duration, sequela: Secondary | ICD-10-CM | POA: Diagnosis not present

## 2016-09-10 DIAGNOSIS — I69314 Frontal lobe and executive function deficit following cerebral infarction: Secondary | ICD-10-CM | POA: Diagnosis present

## 2016-09-10 DIAGNOSIS — I69398 Other sequelae of cerebral infarction: Secondary | ICD-10-CM | POA: Diagnosis not present

## 2016-09-10 DIAGNOSIS — I480 Paroxysmal atrial fibrillation: Secondary | ICD-10-CM | POA: Diagnosis not present

## 2016-09-10 DIAGNOSIS — M5136 Other intervertebral disc degeneration, lumbar region: Secondary | ICD-10-CM | POA: Diagnosis not present

## 2016-09-10 NOTE — Patient Instructions (Signed)
If urine catheter volumes are less than 200, may reduce to once a day. Catheterization. If urine catheter volumes are less than 200 and you are, cathing only once a day. You may discontinue cathing

## 2016-09-10 NOTE — Progress Notes (Signed)
Subjective:    Patient ID: [name], male    DOB: January 23, 1931, 83 y.o.   MRN: [id number] 83 y.o.LH-malewith history of HTN, PAF, CVA '97, Parkinson's disease, Knee OA; who was admitted on 08/08/16 with left sided weakness and left gaze preference. CT head reviewed, showing small hemorrhage in right pons and stroke felt to be hypertensive in origin. Follow up CCT stable. He had issues with confusion and hallucinations.  Therapy initiated and patient with resultant delayed processing with cognitive deficits, significant posterior lean, right inattention with imbalance and inability to carry out ADL tasks Admit date: 08/12/2016 Discharge date: 09/03/2016  HPI Needs help with dressing, can dress but very slow Amb with sup and walker No falls Still has weakness on the left side  Still requiring intermittent catheterization twice a day, performed by son. 2-300 cc every 12 hours, patient voiding on his own as well Pain Inventory Average Pain 0 Pain Right Now 0 My pain is no pain  In the last 24 hours, has pain interfered with the following? General activity 0 Relation with others 0 Enjoyment of life 0 What TIME of day is your pain at its worst? no pain Sleep (in general) Good  Pain is worse with: no pain Pain improves with: no pain Relief from Meds: no pain  Mobility ability to climb steps?  yes do you drive?  no use a wheelchair needs help with transfers  Function retired  Neuro/Psych bladder control problems trouble walking  Prior Studies hospital follow up  Physicians involved in your care hospital follow up   Family History  Problem Relation Age of Onset  . Stomach cancer Mother   . Cancer Father   . Cancer Sister    Social History   Social History  . Marital status: Married    Spouse name: N/A  . Number of children: N/A  . Years of education: N/A   Social History Main Topics  . Smoking status: Never Smoker  . Smokeless tobacco: Former Neurosurgeon  .  Alcohol use None  . Drug use: Unknown  . Sexual activity: Not Asked   Other Topics Concern  . None   Social History Narrative  . None   Past Surgical History:  Procedure Laterality Date  . HERNIA REPAIR     Past Medical History:  Diagnosis Date  . Benign prostatic hyperplasia (BPH) with urinary urgency   . DDD (degenerative disc disease), lumbar   . Hypertension   . Knee pain   . PAF (paroxysmal atrial fibrillation) (HCC)   . Parkinson's disease (HCC)   . Rhinitis   . Stroke (HCC)    BP 120/72 (BP Location: Right Arm, Patient Position: Sitting, Cuff Size: Large)   Pulse 63   Resp 14   SpO2 98%   Opioid Risk Score:   Fall Risk Score:  `1  Depression screen PHQ 2/9  No flowsheet data found.  Review of Systems  Constitutional: Negative.   HENT: Negative.   Eyes: Negative.   Respiratory: Negative.   Cardiovascular: Negative.   Gastrointestinal: Negative.   Endocrine: Negative.   Genitourinary: Negative.   Musculoskeletal: Positive for gait problem.  Skin: Negative.   Allergic/Immunologic: Negative.   Hematological: Negative.   Psychiatric/Behavioral: Negative.   All other systems reviewed and are negative.      Objective:   Physical Exam  Constitutional: He is oriented to person, place, and time. He appears well-developed and well-nourished.  HENT:  Head: Normocephalic and atraumatic.  Eyes: Conjunctivae and  EOM are normal. Pupils are equal, round, and reactive to light.  Neck: Normal range of motion.  Cardiovascular: Normal rate, regular rhythm, normal heart sounds and intact distal pulses.   No murmur heard. Pulmonary/Chest: Breath sounds normal. No respiratory distress. He has no wheezes.  Abdominal: Soft. Bowel sounds are normal. He exhibits no distension. There is no tenderness.  Neurological: He is alert and oriented to person, place, and time. He exhibits normal muscle tone. Coordination and gait abnormal.  Motor strength is 5/5 in the right  deltoid, biceps, triceps, grip, hip flexor, knee extensor, ankle dorsiflexor. Left sided strength is 4/5 in the deltoid, biceps, triceps, grip, hip flexor, knee extensor, ankle dorsiflexor. Gait with rolling walker. He tends to lean toward the left side. Does not require physical assistance to ambulate. Decreased fine motor, left finger to thumb opposition. Mild dysmetria on left finger-nose-finger.   Skin: Skin is warm and dry.  Psychiatric: He has a normal mood and affect.  Nursing note and vitals reviewed.         Assessment & Plan:  1. 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 Continue home health PT, OT and speech therapy. He still has some dysarthria as well. Will need to follow up with neurology to determine if and when anticoagulation will be restarted  2. Urinary retention, likely multifactorial pontine strokes may cause urinary retention. If there is involvement of the pontine micturition Center. He may also have some prostatic hypertrophy. Continue intermittent catheterization program every 12 hours. If catheterization volumes fall below 200 mL and patient is voiding in between catheterizations, would go down to once a day catheterization. Has an appointment with urology in May. Continue Flomax 0.8 milligrams daily at bedtime, continue Urecholine 25 mg 3 times a day 3. Constipation. Continue Senokot S 2 tablets at bedtime. 4. A. fib, rate control bisoprolol 10 mg daily, follow-up PCP  Physical medicine and rehabilitation follow-up in one month

## 2016-10-03 ENCOUNTER — Other Ambulatory Visit: Payer: Self-pay | Admitting: Physical Medicine and Rehabilitation

## 2016-10-08 ENCOUNTER — Encounter: Payer: Medicare Other | Attending: Physical Medicine & Rehabilitation

## 2016-10-08 ENCOUNTER — Ambulatory Visit: Payer: Medicare Other | Admitting: Physical Medicine & Rehabilitation

## 2016-10-08 DIAGNOSIS — R339 Retention of urine, unspecified: Secondary | ICD-10-CM | POA: Insufficient documentation

## 2016-10-08 DIAGNOSIS — I69314 Frontal lobe and executive function deficit following cerebral infarction: Secondary | ICD-10-CM | POA: Insufficient documentation

## 2016-10-08 DIAGNOSIS — S06389S Contusion, laceration, and hemorrhage of brainstem with loss of consciousness of unspecified duration, sequela: Secondary | ICD-10-CM | POA: Insufficient documentation

## 2016-10-08 DIAGNOSIS — K5900 Constipation, unspecified: Secondary | ICD-10-CM | POA: Insufficient documentation

## 2016-10-08 DIAGNOSIS — I480 Paroxysmal atrial fibrillation: Secondary | ICD-10-CM | POA: Insufficient documentation

## 2016-10-08 DIAGNOSIS — R269 Unspecified abnormalities of gait and mobility: Secondary | ICD-10-CM | POA: Insufficient documentation

## 2016-10-08 DIAGNOSIS — I10 Essential (primary) hypertension: Secondary | ICD-10-CM | POA: Insufficient documentation

## 2016-10-08 DIAGNOSIS — Z5189 Encounter for other specified aftercare: Secondary | ICD-10-CM | POA: Insufficient documentation

## 2016-10-08 DIAGNOSIS — M5136 Other intervertebral disc degeneration, lumbar region: Secondary | ICD-10-CM | POA: Insufficient documentation

## 2016-10-08 DIAGNOSIS — I69354 Hemiplegia and hemiparesis following cerebral infarction affecting left non-dominant side: Secondary | ICD-10-CM | POA: Insufficient documentation

## 2016-10-08 DIAGNOSIS — G20 Parkinson's disease: Secondary | ICD-10-CM | POA: Insufficient documentation

## 2016-10-11 ENCOUNTER — Ambulatory Visit (HOSPITAL_BASED_OUTPATIENT_CLINIC_OR_DEPARTMENT_OTHER): Payer: Medicare Other | Admitting: Physical Medicine & Rehabilitation

## 2016-10-11 ENCOUNTER — Encounter: Payer: Self-pay | Admitting: Physical Medicine & Rehabilitation

## 2016-10-11 VITALS — BP 140/64 | HR 62

## 2016-10-11 DIAGNOSIS — R339 Retention of urine, unspecified: Secondary | ICD-10-CM | POA: Diagnosis not present

## 2016-10-11 DIAGNOSIS — K5900 Constipation, unspecified: Secondary | ICD-10-CM | POA: Diagnosis not present

## 2016-10-11 DIAGNOSIS — I480 Paroxysmal atrial fibrillation: Secondary | ICD-10-CM | POA: Diagnosis not present

## 2016-10-11 DIAGNOSIS — I613 Nontraumatic intracerebral hemorrhage in brain stem: Secondary | ICD-10-CM

## 2016-10-11 DIAGNOSIS — I69398 Other sequelae of cerebral infarction: Secondary | ICD-10-CM | POA: Diagnosis not present

## 2016-10-11 DIAGNOSIS — M5136 Other intervertebral disc degeneration, lumbar region: Secondary | ICD-10-CM | POA: Diagnosis not present

## 2016-10-11 DIAGNOSIS — G20 Parkinson's disease: Secondary | ICD-10-CM | POA: Diagnosis not present

## 2016-10-11 DIAGNOSIS — Z5189 Encounter for other specified aftercare: Secondary | ICD-10-CM | POA: Diagnosis present

## 2016-10-11 DIAGNOSIS — I69354 Hemiplegia and hemiparesis following cerebral infarction affecting left non-dominant side: Secondary | ICD-10-CM | POA: Diagnosis not present

## 2016-10-11 DIAGNOSIS — I10 Essential (primary) hypertension: Secondary | ICD-10-CM | POA: Diagnosis not present

## 2016-10-11 DIAGNOSIS — S06389S Contusion, laceration, and hemorrhage of brainstem with loss of consciousness of unspecified duration, sequela: Secondary | ICD-10-CM | POA: Diagnosis not present

## 2016-10-11 DIAGNOSIS — I69314 Frontal lobe and executive function deficit following cerebral infarction: Secondary | ICD-10-CM | POA: Diagnosis present

## 2016-10-11 DIAGNOSIS — R269 Unspecified abnormalities of gait and mobility: Secondary | ICD-10-CM | POA: Diagnosis present

## 2016-10-11 NOTE — Progress Notes (Signed)
Subjective:    Patient ID: [name], male    DOB: 1930-10-02, 83 y.o.   MRN: [id number] 83 y.o.LH-malewith history of HTN, PAF, CVA '97, Parkinson's disease, Knee OA; who was admitted on 08/08/16 with left sided weakness and left gaze preference. CT head reviewed, showing small hemorrhage in right pons and stroke felt to be hypertensive in origin. Follow up CCT stable. He had issues with confusion and hallucinations.  Therapy initiated and patient with resultant delayed processing with cognitive deficits, significant posterior lean, right inattention with imbalance and inability to carry out ADL tasks HPI Dressing self and bathing self No falls at home Cath vol only ~184ml, voiding ~2051ml per day amb without device HHRN has one more visit HHSLP and PT  No longer getting [facility]  Urology appt next wk for cysto Pain Inventory Average Pain no pain Pain Right Now 0 My pain is no pain  In the last 24 hours, has pain interfered with the following? General activity 0 Relation with others 0 Enjoyment of life 0 What TIME of day is your pain at its worst? no pain Sleep (in general) NA  Pain is worse with: no pain Pain improves with: no pain Relief from Meds: no pain  Mobility walk without assistance how many minutes can you walk? 10 ability to climb steps?  yes do you drive?  no Do you have any goals in this area?  no  Function retired  Neuro/Psych bladder control problems  Prior Studies Any changes since last visit?  no  Physicians involved in your care Any changes since last visit?  no   Family History  Problem Relation Age of Onset  . Stomach cancer Mother   . Cancer Father   . Cancer Sister    Social History   Social History  . Marital status: Married    Spouse name: N/A  . Number of children: N/A  . Years of education: N/A   Social History Main Topics  . Smoking status: Never Smoker  . Smokeless tobacco: Former Neurosurgeon  . Alcohol use Not on file    . Drug use: Unknown  . Sexual activity: Not on file   Other Topics Concern  . Not on file   Social History Narrative  . No narrative on file   Past Surgical History:  Procedure Laterality Date  . HERNIA REPAIR     Past Medical History:  Diagnosis Date  . Benign prostatic hyperplasia (BPH) with urinary urgency   . DDD (degenerative disc disease), lumbar   . Hypertension   . Knee pain   . PAF (paroxysmal atrial fibrillation) (HCC)   . Parkinson's disease (HCC)   . Rhinitis   . Stroke [facility])    There were no vitals taken for this visit.  Opioid Risk Score:   Fall Risk Score:  `1  Depression screen PHQ 2/9  No flowsheet data found.  Review of Systems  Constitutional: Negative.   HENT: Negative.   Eyes: Negative.   Respiratory: Negative.   Cardiovascular: Negative.   Gastrointestinal: Negative.   Endocrine: Negative.   Genitourinary: Positive for dysuria, enuresis and frequency.  Musculoskeletal: Negative.   Skin: Negative.   Allergic/Immunologic: Negative.   Neurological: Negative.   Hematological: Negative.   Psychiatric/Behavioral: Negative.        Objective:   Physical Exam  Constitutional: He is oriented to person, place, and time. He appears well-developed and well-nourished.  HENT:  Head: Normocephalic and atraumatic.  Eyes: Conjunctivae and EOM are  normal. Pupils are equal, round, and reactive to light.  Neurological: He is alert and oriented to person, place, and time. Gait abnormal.  Wide based support  Motor strength is 4/5 in the left deltoid, bicep, tricep, grip, 4 plus in the left hip flexor, knee extensor, ankle dorsiflexor 5/5 in the right deltoid, bicep, tricep, grip, hip flexor, knee extensor, ankle dorsiflexor,  Sensation reported as equal bilateral upper and lower limbs to light touch  Skin: Skin is warm and dry.  Psychiatric: He has a normal mood and affect.  Nursing note and vitals reviewed.         Assessment & Plan:  1.  Right pontine infarct with mild residual left-sided weakness and gait disorder,. His balance is not back to baseline but is no longer requiring assistive device. He will finish up his home health therapy, I do not think he needs outpatient therapy. Given his high functional status. We'll continue with his home exercise program.  He will need to follow-up with his primary care He will need to follow up with neurology  Physical medicine and rehabilitation follow-up on as-needed basis

## 2016-11-02 ENCOUNTER — Other Ambulatory Visit: Payer: Self-pay | Admitting: Physical Medicine and Rehabilitation

## 2016-11-06 ENCOUNTER — Emergency Department (HOSPITAL_COMMUNITY)
Admission: EM | Admit: 2016-11-06 | Discharge: 2016-11-07 | Disposition: A | Discharge status: Home / Self Care | Payer: Medicare Other | Attending: Emergency Medicine | Admitting: Emergency Medicine

## 2016-11-06 ENCOUNTER — Emergency Department (HOSPITAL_COMMUNITY): Payer: Medicare Other

## 2016-11-06 ENCOUNTER — Encounter (HOSPITAL_COMMUNITY): Payer: Self-pay | Admitting: Emergency Medicine

## 2016-11-06 DIAGNOSIS — R2240 Localized swelling, mass and lump, unspecified lower limb: Secondary | ICD-10-CM | POA: Diagnosis present

## 2016-11-06 DIAGNOSIS — I10 Essential (primary) hypertension: Secondary | ICD-10-CM | POA: Diagnosis not present

## 2016-11-06 DIAGNOSIS — Z8673 Personal history of transient ischemic attack (TIA), and cerebral infarction without residual deficits: Secondary | ICD-10-CM | POA: Insufficient documentation

## 2016-11-06 DIAGNOSIS — R600 Localized edema: Secondary | ICD-10-CM | POA: Insufficient documentation

## 2016-11-06 DIAGNOSIS — Z79899 Other long term (current) drug therapy: Secondary | ICD-10-CM | POA: Diagnosis not present

## 2016-11-06 DIAGNOSIS — R609 Edema, unspecified: Secondary | ICD-10-CM

## 2016-11-06 LAB — URINALYSIS, ROUTINE W REFLEX MICROSCOPIC
Bilirubin Urine: NEGATIVE
Glucose, UA: NEGATIVE mg/dL
Hgb urine dipstick: NEGATIVE
Ketones, ur: NEGATIVE mg/dL
Nitrite: NEGATIVE
PH: 7 (ref 5.0–8.0)
Protein, ur: NEGATIVE mg/dL
SPECIFIC GRAVITY, URINE: 1.006 (ref 1.005–1.030)

## 2016-11-06 LAB — BRAIN NATRIURETIC PEPTIDE: B Natriuretic Peptide: 323.9 pg/mL — ABNORMAL HIGH (ref 0.0–100.0)

## 2016-11-06 LAB — BASIC METABOLIC PANEL
ANION GAP: 7 (ref 5–15)
BUN: 15 mg/dL (ref 6–20)
CHLORIDE: 105 mmol/L (ref 101–111)
CO2: 24 mmol/L (ref 22–32)
CREATININE: 1.48 mg/dL — AB (ref 0.61–1.24)
Calcium: 9.1 mg/dL (ref 8.9–10.3)
GFR calc non Af Amer: 42 mL/min — ABNORMAL LOW (ref >60)
GFR, EST AFRICAN AMERICAN: 49 mL/min — AB (ref >60)
Glucose, Bld: 123 mg/dL — ABNORMAL HIGH (ref 65–99)
POTASSIUM: 4 mmol/L (ref 3.5–5.1)
Sodium: 136 mmol/L (ref 135–145)

## 2016-11-06 LAB — COMPREHENSIVE METABOLIC PANEL
ALK PHOS: 56 U/L (ref 38–126)
ALT: 11 U/L — AB (ref 17–63)
AST: 20 U/L (ref 15–41)
Albumin: 3.4 g/dL — ABNORMAL LOW (ref 3.5–5.0)
Anion gap: 11 (ref 5–15)
BILIRUBIN TOTAL: 0.8 mg/dL (ref 0.3–1.2)
BUN: 14 mg/dL (ref 6–20)
CALCIUM: 9.2 mg/dL (ref 8.9–10.3)
CO2: 22 mmol/L (ref 22–32)
CREATININE: 1.39 mg/dL — AB (ref 0.61–1.24)
Chloride: 105 mmol/L (ref 101–111)
GFR calc non Af Amer: 45 mL/min — ABNORMAL LOW (ref >60)
GFR, EST AFRICAN AMERICAN: 52 mL/min — AB (ref >60)
GLUCOSE: 92 mg/dL (ref 65–99)
Potassium: 4 mmol/L (ref 3.5–5.1)
Sodium: 138 mmol/L (ref 135–145)
Total Protein: 6.9 g/dL (ref 6.5–8.1)

## 2016-11-06 LAB — CBC WITH DIFFERENTIAL/PLATELET
Basophils Absolute: 0.1 10*3/uL (ref 0.0–0.1)
Basophils Relative: 1 %
Eosinophils Absolute: 0.4 10*3/uL (ref 0.0–0.7)
Eosinophils Relative: 4 %
HEMATOCRIT: 35.8 % — AB (ref 39.0–52.0)
HEMOGLOBIN: 11.4 g/dL — AB (ref 13.0–17.0)
LYMPHS ABS: 1.3 10*3/uL (ref 0.7–4.0)
Lymphocytes Relative: 13 %
MCH: 29.5 pg (ref 26.0–34.0)
MCHC: 31.8 g/dL (ref 30.0–36.0)
MCV: 92.7 fL (ref 78.0–100.0)
Monocytes Absolute: 1.2 10*3/uL — ABNORMAL HIGH (ref 0.1–1.0)
Monocytes Relative: 13 %
NEUTROS ABS: 6.4 10*3/uL (ref 1.7–7.7)
NEUTROS PCT: 69 %
Platelets: 269 10*3/uL (ref 150–400)
RBC: 3.86 MIL/uL — AB (ref 4.22–5.81)
RDW: 14.1 % (ref 11.5–15.5)
WBC: 9.3 10*3/uL (ref 4.0–10.5)

## 2016-11-06 MED ORDER — FUROSEMIDE 20 MG PO TABS: 20.0000 mg | ORAL_TABLET | Freq: Every day | ORAL | 0 refills | Status: DC

## 2016-11-06 MED ORDER — POTASSIUM CHLORIDE ER 10 MEQ PO TBCR: 10.0000 meq | EXTENDED_RELEASE_TABLET | Freq: Two times a day (BID) | ORAL | 0 refills | Status: DC

## 2016-11-06 MED ORDER — FUROSEMIDE 10 MG/ML IJ SOLN
40.0000 mg | Freq: Once | INTRAMUSCULAR | Status: AC
  Administered 2016-11-06: 40 mg via INTRAVENOUS
  Filled 2016-11-06: qty 4

## 2016-11-06 MED ORDER — FUROSEMIDE 10 MG/ML IJ SOLN
40.0000 mg | Freq: Once | INTRAMUSCULAR | Status: DC
  Filled 2016-11-06: qty 4

## 2016-11-06 NOTE — ED Notes (Signed)
Pt called for reassessment no answer Nurse First aware

## 2016-11-06 NOTE — ED Provider Notes (Signed)
Patient seen and evaluated. Discussed with resident. Patient was signs of CHF with crackles on exam and dependent edema. Pulmonary congestion versus pulmonary fibrosis on chest x-ray. BNP elevated at 323. EKG shows no change in rhythm or morphology. Creatinine baseline at 1.48. No new medications that would explain CHF. Has been on Keflex for recent UTI.  With CVA in March had echo showing an EF of 60, and preserved systolic/diastolic function.  He has been able to ambulate although he states it has been more difficult with the edema in his legs. Plan IV Lasix. We'll check UA and CMP. And surely does not have the nephrotic state. May be a candidate for discharge if diuresis and otherwise normal testing.   Rolland PorterJames, Kairee Isa, MD 11/06/16 2049

## 2016-11-06 NOTE — ED Triage Notes (Signed)
Pt here for bilateral leg swelling and congestion; pt with hx of same; pt sts SOB with exertion

## 2016-11-06 NOTE — Discharge Instructions (Signed)
call or return to have any questions, new symptoms, change in symptoms, or symptoms that you do not understand. Weight yourself daily. Make sure you self cath at least 2-3 times a day as you will make more urine. F/u with your pcp within 2-3 days

## 2016-11-06 NOTE — ED Notes (Signed)
No answer from patient when attempting to assess for vitals.

## 2016-11-06 NOTE — ED Provider Notes (Signed)
[facility]-EMERGENCY DEPT Provider Note   CSN: [id number] Arrival date & time: 11/06/16  1458     History   Chief Complaint Chief Complaint  Patient presents with  . Leg Swelling    HPI [name] is a 83 y.o. male.  The history is provided by the patient and a relative.  Patient is a 83 year old male who presents with worsening leg swelling, mild dyspnea on exertion, productive cough of frothy sputum, for the past 2 weeks. Family believes this all started after being started on Keflex 2 weeks ago for UTI. He does require an and out catheterizations twice a day. Otherwise denies any chest pain or shortness of breath at rest. No fevers, nausea, vomiting, diarrhea, abdominal pain. His main concern is his leg swelling causing him to have more difficulty ambulating. He does not take Lasix and had a recent echo a few months ago which did not reveal evidence of CHF.  Past Medical History:  Diagnosis Date  . Benign prostatic hyperplasia (BPH) with urinary urgency   . DDD (degenerative disc disease), lumbar   . Hypertension   . Knee pain   . PAF (paroxysmal atrial fibrillation) (HCC)   . Parkinson's disease (HCC)   . Rhinitis   . Stroke [facility])     Patient Active Problem List   Diagnosis Date Noted  . Gait disturbance, post-stroke 10/11/2016  . Urinary retention with incomplete bladder emptying 09/03/2016  . E. coli UTI 09/03/2016  . 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 Hyperlipidemia 08/12/2016  . Hypertensive emergency   . Benign essential HTN   . PAF (paroxysmal atrial fibrillation) (HCC)   . History of CVA (cerebrovascular accident)   . Parkinson disease (HCC)   . Primary osteoarthritis of right knee   . Leukocytosis   . AKI (acute kidney injury) [facility])     Past Surgical History:  Procedure Laterality Date  . HERNIA REPAIR         Home Medications    Prior to Admission medications   Medication Sig Start Date End Date Taking?  Authorizing Provider  acetaminophen (TYLENOL) 325 MG tablet Take 325-650 mg by mouth every 6 (six) hours as needed (for pain or headaches).    [provider]  bethanechol (URECHOLINE) 25 MG tablet Take 1 tablet (25 mg total) by mouth 3 (three) times daily. 09/03/16   Love, Evlyn Kanner, PA-C  bisoprolol (ZEBETA) 10 MG tablet Take 1 tablet (10 mg total) by mouth daily. 09/03/16   Love, Evlyn Kanner, PA-C  calcium carbonate (TUMS - DOSED IN MG ELEMENTAL CALCIUM) 500 MG chewable tablet Chew 2 tablets by mouth 3 (three) times daily after meals.    [provider]  cephALEXin (KEFLEX) 250 MG capsule Take 1 capsule (250 mg total) by mouth every 8 (eight) hours. Patient taking differently: Take 500 mg by mouth 4 (four) times daily.  09/03/16   Love, Evlyn Kanner, PA-C  furosemide (LASIX) 20 MG tablet Take 1 tablet (20 mg total) by mouth daily. 11/07/16   Marijean Niemann, MD  potassium chloride (K-DUR) 10 MEQ tablet Take 1 tablet (10 mEq total) by mouth 2 (two) times daily. 11/06/16   Marijean Niemann, MD  senna-docusate (SENOKOT-S) 8.6-50 MG tablet Take 2 tablets by mouth at bedtime. 09/03/16   Love, Evlyn Kanner, PA-C  tamsulosin (FLOMAX) 0.4 MG CAPS capsule Take 2 capsules (0.8 mg total) by mouth daily after supper. 09/03/16   Jacquelynn Cree, PA-C    Family  History Family History  Problem Relation Age of Onset  . Stomach cancer Mother   . Cancer Father   . Cancer Sister     Social History Social History  Substance Use Topics  . Smoking status: Never Smoker  . Smokeless tobacco: Former Neurosurgeon  . Alcohol use Not on file     Allergies   Phenytoin sodium extended and Statins   Review of Systems Review of Systems  Constitutional: Negative for fever.  HENT: Negative.   Respiratory: Positive for cough and shortness of breath. Negative for wheezing.   Cardiovascular: Positive for leg swelling. Negative for chest pain.  Gastrointestinal: Negative.   Genitourinary: Negative.   Musculoskeletal:  Negative.   Neurological: Negative.   All other systems reviewed and are negative.    Physical Exam Updated Vital Signs BP (!) 192/84   Pulse 74   Resp 17   SpO2 98%  ED Triage Vitals  Enc Vitals Group     BP 11/06/16 1514 (!) 161/69     Pulse Rate 11/06/16 1514 74     Resp 11/06/16 1514 17     Temp --      Temp src --      SpO2 11/06/16 1514 98 %     Weight --      Height --      Head Circumference --      Peak Flow --      Pain Score 11/06/16 2038 0     Pain Loc --      Pain Edu? --      Excl. in GC? --     Physical Exam  Constitutional: He is oriented to person, place, and time. He appears well-developed and well-nourished.  HENT:  Head: Normocephalic and atraumatic.  Mouth/Throat: Oropharynx is clear and moist.  Eyes: Conjunctivae are normal.  Neck: Normal range of motion. Neck supple. JVD (mild) present.  Cardiovascular: Normal rate, regular rhythm, normal heart sounds and intact distal pulses.   No murmur heard. Pulmonary/Chest: Effort normal. No respiratory distress.  Faint crackles b/l bases  Abdominal: Soft. He exhibits no distension. There is no tenderness. There is no rebound and no guarding.  Musculoskeletal: He exhibits edema (3+ pitting edema b/l LE symmetrically). He exhibits no tenderness.  Neurological: He is alert and oriented to person, place, and time. No cranial nerve deficit. He exhibits normal muscle tone. Coordination normal.  Skin: Skin is warm and dry.  Psychiatric: He has a normal mood and affect.  Nursing note and vitals reviewed.    ED Treatments / Results  Labs (all labs ordered are listed, but only abnormal results are displayed) Labs Reviewed  BASIC METABOLIC PANEL - Abnormal; Notable for the following:       Result Value   Glucose, Bld 123 (*)    Creatinine, Ser 1.48 (*)    GFR calc non Af Amer 42 (*)    GFR calc Af Amer 49 (*)    All other components within normal limits  BRAIN NATRIURETIC PEPTIDE - Abnormal; Notable for  the following:    B Natriuretic Peptide 323.9 (*)    All other components within normal limits  CBC WITH DIFFERENTIAL/PLATELET - Abnormal; Notable for the following:    RBC 3.86 (*)    Hemoglobin 11.4 (*)    HCT 35.8 (*)    Monocytes Absolute 1.2 (*)    All other components within normal limits  COMPREHENSIVE METABOLIC PANEL - Abnormal; Notable for the following:  Creatinine, Ser 1.39 (*)    Albumin 3.4 (*)    ALT 11 (*)    GFR calc non Af Amer 45 (*)    GFR calc Af Amer 52 (*)    All other components within normal limits  URINALYSIS, ROUTINE W REFLEX MICROSCOPIC - Abnormal; Notable for the following:    Color, Urine STRAW (*)    Leukocytes, UA SMALL (*)    Bacteria, UA RARE (*)    Squamous Epithelial / LPF 0-5 (*)    All other components within normal limits    EKG  EKG Interpretation  Date/Time:  Wednesday Nov 06 2016 15:19:17 EDT Ventricular Rate:  62 PR Interval:    QRS Duration: 76 QT Interval:  386 QTC Calculation: 391 R Axis:   77 Text Interpretation:  Atrial fibrillation Low voltage QRS Cannot rule out Anteroseptal infarct , age undetermined Abnormal ECG Confirmed by Rolland Porter ([id number]) on 11/06/2016 8:35:15 PM       Radiology Dg Chest 2 View  Result Date: 11/06/2016 CLINICAL DATA:  Lower extremity edema with congestion. Shortness of breath with exertion EXAM: CHEST  2 VIEW COMPARISON:  August 13, 2016 and September 05, 2015 FINDINGS: There is somewhat generalized interstitial prominence, felt to represent underlying fibrotic change. There is no airspace consolidation. Heart is mildly enlarged with pulmonary vascularity within normal limits. There is aortic atherosclerosis. No evident adenopathy. No bone lesions. There is aortic atherosclerosis as well as calcification in the region of the left main coronary artery. IMPRESSION: Interstitial fibrotic change. A degree of superimposed interstitial edema/chronic congestive heart failure cannot be excluded. Fibrosis and  interstitial pulmonary edema may well be present concurrently. No airspace consolidation. Heart remains mildly enlarged. There is aortic atherosclerosis as well as calcification in the left main coronary artery. Electronically Signed   By: Bretta Bang III M.D.   On: 11/06/2016 16:03    Procedures Procedures (including critical care time)  Medications Ordered in ED Medications  furosemide (LASIX) injection 40 mg (40 mg Intravenous Given 11/06/16 2132)     Initial Impression / Assessment and Plan / ED Course  I have reviewed the triage vital signs and the nursing notes.  Pertinent labs & imaging results that were available during my care of the patient were reviewed by me and considered in my medical decision making (see chart for details).     85yoM here with leg swelling and mild DOE. No clear hx of chf based on last echo. No chest pain, sob at rest, or o2 req, no infective symptoms. VSS and exam as above. Pt does appear volume overloaded to some degree. Labs without changes in baseline chemistry, urine without protein. BNP is elevated and counts reassuring, cxr with some interstitial edema. Pt does not take lasix at home. Given a dose here with good urinary response. I discussed all findings with pt and family who do with to go home if possible. Will rx lasix and potassium and instruct daily weights and increased urination. Will need to f/u with their pcp within 3 days for re-evaluation, blood work, and possible new echo. At this time doubt acs, hepatic failure, renal failure, or florid chf   I have reviewed all labs. Patient stable for discharge home.  I have reviewed all results with the patient. Patient agrees to stated plan. All questions answered. Advised to call or return to have any questions, new symptoms, change in symptoms, or symptoms that they do not understand.   Final Clinical Impressions(s) / ED  Diagnoses   Final diagnoses:  Peripheral edema  Leg edema    New  Prescriptions Discharge Medication List as of 11/06/2016 11:41 PM    START taking these medications   Details  furosemide (LASIX) 20 MG tablet Take 1 tablet (20 mg total) by mouth daily., Starting Thu 11/07/2016, Print    potassium chloride (K-DUR) 10 MEQ tablet Take 1 tablet (10 mEq total) by mouth 2 (two) times daily., Starting Wed 11/06/2016, Print         Marijean NiemannWoodrum, Josuha Fontanez, MD 11/07/16 1315    Rolland PorterJames, Mark, MD 11/07/16 [phone]

## 2016-11-11 ENCOUNTER — Ambulatory Visit (INDEPENDENT_AMBULATORY_CARE_PROVIDER_SITE_OTHER): Payer: Medicare Other | Admitting: Neurology

## 2016-11-11 ENCOUNTER — Encounter: Payer: Self-pay | Admitting: Neurology

## 2016-11-11 VITALS — BP 137/69 | Ht 68.0 in | Wt 182.8 lb

## 2016-11-11 DIAGNOSIS — I613 Nontraumatic intracerebral hemorrhage in brain stem: Secondary | ICD-10-CM

## 2016-11-11 NOTE — Patient Instructions (Signed)
I had a long d/w patient, wife and son about his recent  hemorrhagic stroke, risk for recurrent stroke/TIAs, personally independently reviewed imaging studies and stroke evaluation results and answered questions.Continue strict control of hypertension with blood pressure goal below 130/90, diabetes with hemoglobin A1c goal below 6.5% and lipids with LDL cholesterol goal below 70 mg/dL. I also advised the patient to eat a healthy diet with plenty of whole grains, cereals, fruits and vegetables, exercise regularly and maintain ideal body weight. Patient was advised fall and safety prevention precautions. Followup in the future with my nurse practitioner in 6 months or call earlier if necessary

## 2016-11-11 NOTE — Progress Notes (Signed)
[facility] [address] [place] [id number] [phone]       OFFICE FOLLOW-UP NOTE  [name] Date of Birth:  June 22, 1930 Medical Record Number:  [id number]   HPI: [name] is a pleasant 83 year old Caucasian male who is seen today for the first office follow-up visit following hospital admission for intracerebral hemorrhage in March 2018. He is accompanied by his wife and son who complement his history. I have reviewed the hospital admission records and personally reviewed imaging films.[name] a 83 y.o.malelast seen well around 3:20 pm on 08/08/2016. He developed a left gaze deviation.Due to this continuing, he presented to the emergency room. He states that his left side feels slightly weaker than typical,but otherwise no associated symptoms. Of note, he has a history of and possible hemorrhage in the past.CT shows a smallpontine hemorrhage. ICH Score: 2. Hewas admitted to the neuro ICU for further evaluation and treatment. The patient had close neurological monitoring and blood pressure was tightly controlled. Follow-up imaging showed stable appearance of the hemorrhage. Patient was obtunded transfer to neurology floor bed and seen by physical occupational therapy and rehabilitation teams. He was transferred to inpatient rehabilitation for ongoing therapy needs. He did well and was discharged from the inferior weeks. Is currently living at home. He is finished home physical and occupation therapy. Is able to walk independently. He feels his neurological back to his prior baseline he does have mild residual left-sided weakness from prior stroke several years ago. He is a cane only while walking outdoors. He has had no recent falls. He is living at home and is independent for activities of daily living. He has no neuro neurological complaints today. He was seen in the emergency room on 11/06/2016 with leg swelling and mild dyspnea on exertion. This  was thought to be possibly due to CHF and was sent discharged on Lasix and potassium and the swelling has since resolved.  14 system review of systems is positive for  weakness, gait and balance difficulties and all other systems negative PMH:  Past Medical History:  Diagnosis Date  . Benign prostatic hyperplasia (BPH) with urinary urgency   . DDD (degenerative disc disease), lumbar   . Hypertension   . Knee pain   . PAF (paroxysmal atrial fibrillation) (HCC)   . Parkinson's disease (HCC)   . Rhinitis   . Stroke [facility](HCC)     Social History:  Social History   Social History  . Marital status: Married    Spouse name: N/A  . Number of children: N/A  . Years of education: N/A   Occupational History  . Not on file.   Social History Main Topics  . Smoking status: Never Smoker  . Smokeless tobacco: Former NeurosurgeonUser  . Alcohol use No  . Drug use: No  . Sexual activity: Not on file   Other Topics Concern  . Not on file   Social History Narrative  . No narrative on file    Medications:   Current Outpatient Prescriptions on File Prior to Visit  Medication Sig Dispense Refill  . acetaminophen (TYLENOL) 325 MG tablet Take 325-650 mg by mouth every 6 (six) hours as needed (for pain or headaches).    . bethanechol (URECHOLINE) 25 MG tablet Take 1 tablet (25 mg total) by mouth 3 (three) times daily. 90 tablet 1  . bisoprolol (ZEBETA) 10 MG tablet Take 1 tablet (10 mg total) by mouth daily. 30 tablet 0  . calcium carbonate (TUMS -  DOSED IN MG ELEMENTAL CALCIUM) 500 MG chewable tablet Chew 2 tablets by mouth 3 (three) times daily after meals.    . cephALEXin (KEFLEX) 250 MG capsule Take 1 capsule (250 mg total) by mouth every 8 (eight) hours. (Patient taking differently: Take 500 mg by mouth 4 (four) times daily. ) 8 capsule 0  . furosemide (LASIX) 20 MG tablet Take 1 tablet (20 mg total) by mouth daily. 15 tablet 0  . potassium chloride (K-DUR) 10 MEQ tablet Take 1 tablet (10 mEq total) by  mouth 2 (two) times daily. 15 tablet 0  . senna-docusate (SENOKOT-S) 8.6-50 MG tablet Take 2 tablets by mouth at bedtime. 100 tablet 0  . tamsulosin (FLOMAX) 0.4 MG CAPS capsule Take 2 capsules (0.8 mg total) by mouth daily after supper. 60 capsule 1   No current facility-administered medications on file prior to visit.     Allergies:   Allergies  Allergen Reactions  . Phenytoin Sodium Extended Other (See Comments)    Fever   . Statins Other (See Comments)    Muscle pain    Physical Exam General: Frail elderly Caucasian male, seated, in no evident distress Head: head normocephalic and atraumatic.  Neck: supple with no carotid or supraclavicular bruits Cardiovascular: regular rate and rhythm, no murmurs Musculoskeletal: no deformity Skin:  no rash/petichiae Vascular:  Normal pulses all extremities Vitals:   11/11/16 1441  BP: 137/69   Neurologic Exam Mental Status: Awake and fully alert. Oriented to place and time. Recent and remote memory intact. Attention span, concentration and fund of knowledge appropriate. Mood and affect appropriate.  Cranial Nerves: Fundoscopic exam reveals sharp disc margins. Pupils equal, briskly reactive to light. Extraocular movements full without nystagmus. Visual fields full to confrontation. Hearing  Diminished slightly bilaterally. Left lower facial weakness.. Facial sensation intact. Face, tongue, palate moves normally and symmetrically.  Motor: Mild left hemiparesis with diminished left grip and intrinsic hand muscle strength. Mild weakness of left hip vigorous and ankle dorsiflexors. Orbits right over left upper extremity. Fine finger movements are diminished on the left. Normal strength on the right side. Tone is slightly increased on the left compared to the right. Sensory.: intact to touch ,pinprick .position and vibratory sensation.  Coordination: Mildly impaired finger to nose and needle coordination on the left compared to the right. Gait and  Station: Arises from chair without difficulty. Stance is normal. Gait demonstrates slight dragging of the left leg and mild imbalancem walk without difficulty.  Reflexes: 2+ and asymmetric and brisker on the left compared to the right. Toes downgoing.   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 Given history of intracerebral hemorrhage anticoagulation despite prior history of stroke and atrial fibrillation would be very risky. I discussed this with patient and family and they understand I also advised the patient to eat a healthy diet with plenty of whole grains, cereals, fruits and vegetables, exercise regularly and maintain ideal body weight. Patient was advised fall and safety  prevention precautions. Followup in the future with my nurse practitioner in 6 months or call earlier if necessary Greater than 50% of time during this 25 minute visit was spent on counseling,explanation of diagnosis, planning of further management, discussion with patient and family and coordination of care Delia Heady, MD Medical Director [facility] Pager: [phone] 11/11/2016 3:42 PM  Note: This document was prepared with digital dictation and possible smart phrase technology. Any transcriptional errors that result from this process are unintentional

## 2017-01-20 ENCOUNTER — Encounter (HOSPITAL_COMMUNITY): Payer: Self-pay | Admitting: Emergency Medicine

## 2017-01-20 ENCOUNTER — Emergency Department (HOSPITAL_COMMUNITY)
Admission: EM | Admit: 2017-01-20 | Discharge: 2017-01-20 | Disposition: A | Discharge status: Home / Self Care | Payer: Medicare Other | Attending: Emergency Medicine | Admitting: Emergency Medicine

## 2017-01-20 ENCOUNTER — Emergency Department (HOSPITAL_COMMUNITY): Payer: Medicare Other

## 2017-01-20 DIAGNOSIS — Z79899 Other long term (current) drug therapy: Secondary | ICD-10-CM | POA: Insufficient documentation

## 2017-01-20 DIAGNOSIS — Z23 Encounter for immunization: Secondary | ICD-10-CM | POA: Insufficient documentation

## 2017-01-20 DIAGNOSIS — I10 Essential (primary) hypertension: Secondary | ICD-10-CM | POA: Diagnosis not present

## 2017-01-20 DIAGNOSIS — Y998 Other external cause status: Secondary | ICD-10-CM | POA: Diagnosis not present

## 2017-01-20 DIAGNOSIS — S0101XA Laceration without foreign body of scalp, initial encounter: Secondary | ICD-10-CM | POA: Diagnosis not present

## 2017-01-20 DIAGNOSIS — Y92008 Other place in unspecified non-institutional (private) residence as the place of occurrence of the external cause: Secondary | ICD-10-CM | POA: Diagnosis not present

## 2017-01-20 DIAGNOSIS — Y939 Activity, unspecified: Secondary | ICD-10-CM | POA: Insufficient documentation

## 2017-01-20 DIAGNOSIS — Z8673 Personal history of transient ischemic attack (TIA), and cerebral infarction without residual deficits: Secondary | ICD-10-CM | POA: Diagnosis not present

## 2017-01-20 DIAGNOSIS — G20 Parkinson's disease: Secondary | ICD-10-CM | POA: Diagnosis not present

## 2017-01-20 DIAGNOSIS — Y92009 Unspecified place in unspecified non-institutional (private) residence as the place of occurrence of the external cause: Secondary | ICD-10-CM

## 2017-01-20 DIAGNOSIS — W19XXXA Unspecified fall, initial encounter: Secondary | ICD-10-CM | POA: Diagnosis not present

## 2017-01-20 DIAGNOSIS — S0990XA Unspecified injury of head, initial encounter: Secondary | ICD-10-CM | POA: Diagnosis present

## 2017-01-20 MED ORDER — LIDOCAINE-EPINEPHRINE (PF) 2 %-1:200000 IJ SOLN
10.0000 mL | Freq: Once | INTRAMUSCULAR | Status: AC
  Administered 2017-01-20: 10 mL
  Filled 2017-01-20: qty 20

## 2017-01-20 MED ORDER — ACETAMINOPHEN 500 MG PO TABS
500.0000 mg | ORAL_TABLET | Freq: Once | ORAL | Status: AC
  Administered 2017-01-20: 500 mg via ORAL
  Filled 2017-01-20: qty 1

## 2017-01-20 MED ORDER — TETANUS-DIPHTH-ACELL PERTUSSIS 5-2.5-18.5 LF-MCG/0.5 IM SUSP
0.5000 mL | Freq: Once | INTRAMUSCULAR | Status: AC
  Administered 2017-01-20: 0.5 mL via INTRAMUSCULAR
  Filled 2017-01-20: qty 0.5

## 2017-01-20 NOTE — ED Provider Notes (Signed)
[facility]-EMERGENCY DEPT Provider Note   CSN: [id number] Arrival date & time: 01/20/17  0418     History   Chief Complaint Chief Complaint  Patient presents with  . Fall    HPI [name] is a 83 y.o. male.  HPI  83 y.o. male with a hx of PAF, HTN, Parkinsons, CVA, presents to the Emergency Department today due to fall from home. Pt unable to recollect events. Pt notes lying in bed at one time and then finding himself in the hallway outside his bedroom with his son over him. Per wife, she heard patient get out of bed and ambulate into hallway. Pt family heard a thud and rushed to find patient on floor with posterior head alc and bleeding. Pt alert and oriented on their arrival within seconds. Pt does not take blood thinners. Pt denies CP/SOB/ABD pain. No headache. No visual changes. No N/V. No numbness/tingling. Per wife, pt has been struggling with balance issues ever since his admission in march. Pt family notes that this is baseline and has been discussed with PCP. No cough/congestion. No fevers. No dysuria. No other symptoms noted.   Past Medical History:  Diagnosis Date  . Benign prostatic hyperplasia (BPH) with urinary urgency   . DDD (degenerative disc disease), lumbar   . Hypertension   . Knee pain   . PAF (paroxysmal atrial fibrillation) (HCC)   . Parkinson's disease (HCC)   . Rhinitis   . Stroke [facility](HCC)     Patient Active Problem List   Diagnosis Date Noted  . Gait disturbance, post-stroke 10/11/2016  . Urinary retention with incomplete bladder emptying 09/03/2016  . E. coli UTI 09/03/2016  . 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 Hyperlipidemia 08/12/2016  . Hypertensive emergency   . Benign essential HTN   . PAF (paroxysmal atrial fibrillation) (HCC)   . History of CVA (cerebrovascular accident)   . Parkinson disease (HCC)   . Primary osteoarthritis of right knee   . Leukocytosis   . AKI (acute kidney injury) [facility](HCC)     Past  Surgical History:  Procedure Laterality Date  . HERNIA REPAIR         Home Medications    Prior to Admission medications   Medication Sig Start Date End Date Taking? Authorizing Provider  acetaminophen (TYLENOL) 325 MG tablet Take 325-650 mg by mouth every 6 (six) hours as needed (for pain or headaches).    [provider]  bethanechol (URECHOLINE) 25 MG tablet Take 1 tablet (25 mg total) by mouth 3 (three) times daily. 09/03/16   Love, Evlyn KannerPamela S, PA-C  bisoprolol (ZEBETA) 10 MG tablet Take 1 tablet (10 mg total) by mouth daily. 09/03/16   Love, Evlyn KannerPamela S, PA-C  calcium carbonate (TUMS - DOSED IN MG ELEMENTAL CALCIUM) 500 MG chewable tablet Chew 2 tablets by mouth 3 (three) times daily after meals.    [provider]  cephALEXin (KEFLEX) 250 MG capsule Take 1 capsule (250 mg total) by mouth every 8 (eight) hours. Patient taking differently: Take 500 mg by mouth 4 (four) times daily.  09/03/16   Love, Evlyn KannerPamela S, PA-C  furosemide (LASIX) 20 MG tablet Take 1 tablet (20 mg total) by mouth daily. 11/07/16   Marijean NiemannWoodrum, Orlando Thalmann, MD  potassium chloride (K-DUR) 10 MEQ tablet Take 1 tablet (10 mEq total) by mouth 2 (two) times daily. 11/06/16   Marijean NiemannWoodrum, Rosaleen Mazer, MD  senna-docusate (SENOKOT-S) 8.6-50 MG tablet Take 2 tablets by mouth at bedtime. 09/03/16  Love, Evlyn Kanner, PA-C  tamsulosin (FLOMAX) 0.4 MG CAPS capsule Take 2 capsules (0.8 mg total) by mouth daily after supper. 09/03/16   Jacquelynn Cree, PA-C    Family History Family History  Problem Relation Age of Onset  . Stomach cancer Mother   . Cancer Father   . Cancer Sister   . Stroke Sister   . Stroke Brother     Social History Social History  Substance Use Topics  . Smoking status: Never Smoker  . Smokeless tobacco: Former Neurosurgeon  . Alcohol use No     Allergies   Phenytoin sodium extended and Statins   Review of Systems Review of Systems ROS reviewed and all are negative for acute change except as noted in the  HPI  Physical Exam Updated Vital Signs BP (!) 182/90 (BP Location: Right Arm)   Pulse 75   Temp 98.2 F (36.8 C) (Oral)   Resp 15   SpO2 94%   Physical Exam  Constitutional: He is oriented to person, place, and time. Vital signs are normal. He appears well-developed and well-nourished. No distress.  HENT:  Head: Normocephalic and atraumatic. Head is without raccoon's eyes and without Battle's sign.  Right Ear: Hearing normal. No hemotympanum.  Left Ear: Hearing normal. No hemotympanum.  Nose: Nose normal.  Mouth/Throat: Uvula is midline, oropharynx is clear and moist and mucous membranes are normal.  Posterior head lac around 2cm. Bottom of wound visualized. Bleeding controlled.   Eyes: Pupils are equal, round, and reactive to light. Conjunctivae and EOM are normal.  Neck: Trachea normal and normal range of motion. Neck supple. No spinous process tenderness and no muscular tenderness present. No tracheal deviation and normal range of motion present.  Cardiovascular: Normal rate, regular rhythm, S1 normal, S2 normal, normal heart sounds, intact distal pulses and normal pulses.   Pulmonary/Chest: Effort normal and breath sounds normal. No respiratory distress. He has no decreased breath sounds. He has no wheezes. He has no rhonchi. He has no rales.  Abdominal: Normal appearance and bowel sounds are normal. There is no tenderness. There is no rigidity and no guarding.  Musculoskeletal: Normal range of motion.  Moving x 4 extremities without difficulty.   Neurological: He is alert and oriented to person, place, and time. He has normal strength. No cranial nerve deficit or sensory deficit.  Cranial Nerves:  II: Pupils equal, round, reactive to light III,IV, VI: ptosis not present, extra-ocular motions intact bilaterally  V,VII: smile symmetric, facial light touch sensation equal VIII: hearing grossly normal bilaterally  IX,X: midline uvula rise  XI: bilateral shoulder shrug equal and  strong XII: midline tongue extension  Skin: Skin is warm and dry.  Left elbow with superficial skin abrasion. Bleeding controlled.   Psychiatric: He has a normal mood and affect. His speech is normal and behavior is normal. Thought content normal.  Nursing note and vitals reviewed.    ED Treatments / Results  Labs (all labs ordered are listed, but only abnormal results are displayed) Labs Reviewed - No data to display  EKG  EKG Interpretation None       Radiology No results found.  Procedures .Marland KitchenLaceration Repair Date/Time: 01/20/2017 6:11 AM Performed by: Audry Pili Authorized by: Audry Pili   Consent:    Consent obtained:  Verbal   Consent given by:  Patient   Risks discussed:  Infection, poor cosmetic result, poor wound healing and pain Anesthesia (see MAR for exact dosages):    Anesthesia method:  Local  infiltration   Local anesthetic:  Lidocaine 1% WITH epi Laceration details:    Location:  Scalp   Scalp location:  Occipital   Length (cm):  2 Repair type:    Repair type:  Simple Pre-procedure details:    Preparation:  Imaging obtained to evaluate for foreign bodies Exploration:    Hemostasis achieved with:  Direct pressure Treatment:    Area cleansed with:  Hibiclens and saline   Amount of cleaning:  Extensive   Irrigation solution:  Sterile water   Irrigation method:  Pressure wash Skin repair:    Repair method:  Staples   Number of staples:  3 Approximation:    Approximation:  Close   Vermilion border: well-aligned   Post-procedure details:    Dressing:  Antibiotic ointment   Patient tolerance of procedure:  Tolerated well, no immediate complications   (including critical care time)  Medications Ordered in ED Medications  Tdap (BOOSTRIX) injection 0.5 mL (0.5 mLs Intramuscular Given 01/20/17 0450)  lidocaine-EPINEPHrine (XYLOCAINE W/EPI) 2 %-1:200000 (PF) injection 10 mL (10 mLs Infiltration Given 01/20/17 0450)     Initial Impression /  Assessment and Plan / ED Course  I have reviewed the triage vital signs and the nursing notes.  Pertinent labs & imaging results that were available during my care of the patient were reviewed by me and considered in my medical decision making (see chart for details).  Final Clinical Impressions(s) / ED Diagnoses   {I have reviewed and evaluated the relevant imaging studies. {I have interpreted the relevant EKG. {I have reviewed the relevant previous healthcare records. {I have reviewed EMS Documentation. {I obtained HPI from historian. {Patient discussed with supervising physician.  ED Course:  Assessment: Pt is a 83 y.o. male with a hx of PAF, HTN, Parkinsons, CVA, presents to the Emergency Department today due to fall from home. Pt unable to recollect events. Pt notes lying in bed at one time and then finding himself in the hallway outside his bedroom with his son over him. Per wife, she heard patient get out of bed and ambulate into hallway. Pt family heard a thud and rushed to find patient on floor with posterior head alc and bleeding. Pt alert and oriented on their arrival within seconds. Pt does not take blood thinners. Pt denies CP/SOB/ABD pain. No headache. No visual changes. No N/V. No numbness/tingling. Per wife, pt has been struggling with balance issues ever since his admission in march. Pt family notes that this is baseline and has been discussed with PCP. No cough/congestion. No fevers. No dysuria. On exam, pt in NAD. Nontoxic/nonseptic appearing. VSS. Afebrile. Lungs CTA. Heart RRR. Abdomen nontender soft. Posterior head lac around 2cm. Bottom of wound visualized. Bleeding controlled. CT Head/Neck unremarkable. CXR unremarkable. Tdap booster given. Pressure irrigation performed. Bottom of the wound visualized with bleeding controled. Laceration occurred < 8 hours prior to repair which was well tolerated. Pt has no co morbidities to effect normal wound healing. Discussed staple home care w  pt and answered questions. Pt to f-u for wound check and staple removal in 5-8 days. Seen by supervising physician. Plan is to DC Home with follow up to PCP. At time of discharge, Patient is in no acute distress. Vital Signs are stable. Patient is able to ambulate. Patient able to tolerate PO.    Disposition/Plan:  DC Home Additional Verbal discharge instructions given and discussed with patient.  Pt Instructed to f/u with PCP in the next week for evaluation and treatment of  symptoms. Return precautions given Pt acknowledges and agrees with plan  Supervising Physician Dione Booze, MD  Final diagnoses:  Laceration of scalp, initial encounter  Fall in home, initial encounter    New Prescriptions New Prescriptions   No medications on file     Audry Pili, Cordelia Poche 01/20/17 0612    Dione Booze, MD 01/20/17 [phone]

## 2017-01-20 NOTE — ED Triage Notes (Signed)
Pt BIB EMS for fall. Pt is from home and fell striking the back of his head. Pt does not think he passed out but does not remember falling. Pt has a lac on the back of his forehead and a skin tear to the left elbow. NAD noted. Pt denies anticoagulation

## 2017-01-20 NOTE — ED Notes (Signed)
Patient transported to CT 

## 2017-01-20 NOTE — Discharge Instructions (Signed)
Please read and follow all provided instructions.  Your diagnoses today include:  1. Laceration of scalp, initial encounter   2. Fall in home, initial encounter     Tests performed today include: X-ray of the affected area that did not show any foreign bodies or broken bones Vital signs. See below for your results today.   Medications prescribed:   Take any prescribed medications only as directed.   Home care instructions:  Follow any educational materials and wound care instructions contained in this packet.   You may shower and wash the area with soap and water, just be sure to pat the area dry and not rub over the stitches. Do no put your stiches underwater (in a bath, pool, or lake). Getting stiches wet can slow down healing and increase your chances of getting an infection. You may apply Bacitracin or Neosporin twice a day for 7 days, and keep the ara clean with  bandage or gauze. Do not apply alcohol or hydrogen peroxide. Cover the area if it draining or weeping.   Follow-up instructions: Staple Removal: Return to the Emergency Department or see your primary care care doctor in 5-8 days for a recheck of your wound and removal of your sutures or staples.    Return instructions:  Return to the Emergency Department if you have: Fever Worsening pain Worsening swelling of the wound Pus draining from the wound Redness of the skin that moves away from the wound, especially if it streaks away from the affected area  Any other emergent concerns  Your vital signs today were: BP (!) 182/90 (BP Location: Right Arm)    Pulse 75    Temp 98.2 F (36.8 C) (Oral)    Resp 15    SpO2 94%  If your blood pressure (BP) was elevated above 135/85 this visit, please have this repeated by your doctor within one month. --------------

## 2017-01-20 NOTE — ED Notes (Signed)
Pt and family understood dc material. NAD noted 

## 2017-05-13 ENCOUNTER — Ambulatory Visit: Payer: Medicare Other | Admitting: Neurology

## 2018-10-20 ENCOUNTER — Emergency Department (HOSPITAL_COMMUNITY): Payer: Medicare Other

## 2018-10-20 ENCOUNTER — Inpatient Hospital Stay (HOSPITAL_COMMUNITY)
Admission: EM | Admit: 2018-10-20 | Discharge: 2018-10-26 | DRG: 304 | Disposition: A | Discharge status: Home Health | Payer: Medicare Other | Attending: Internal Medicine | Admitting: Internal Medicine

## 2018-10-20 ENCOUNTER — Encounter (HOSPITAL_COMMUNITY): Payer: Self-pay

## 2018-10-20 ENCOUNTER — Other Ambulatory Visit: Payer: Self-pay

## 2018-10-20 DIAGNOSIS — N3000 Acute cystitis without hematuria: Secondary | ICD-10-CM

## 2018-10-20 DIAGNOSIS — N390 Urinary tract infection, site not specified: Secondary | ICD-10-CM | POA: Diagnosis present

## 2018-10-20 DIAGNOSIS — E785 Hyperlipidemia, unspecified: Secondary | ICD-10-CM | POA: Diagnosis present

## 2018-10-20 DIAGNOSIS — Z888 Allergy status to other drugs, medicaments and biological substances status: Secondary | ICD-10-CM

## 2018-10-20 DIAGNOSIS — R05 Cough: Secondary | ICD-10-CM

## 2018-10-20 DIAGNOSIS — R338 Other retention of urine: Secondary | ICD-10-CM | POA: Diagnosis present

## 2018-10-20 DIAGNOSIS — I160 Hypertensive urgency: Principal | ICD-10-CM | POA: Diagnosis present

## 2018-10-20 DIAGNOSIS — R1311 Dysphagia, oral phase: Secondary | ICD-10-CM | POA: Diagnosis present

## 2018-10-20 DIAGNOSIS — R0989 Other specified symptoms and signs involving the circulatory and respiratory systems: Secondary | ICD-10-CM

## 2018-10-20 DIAGNOSIS — Z8673 Personal history of transient ischemic attack (TIA), and cerebral infarction without residual deficits: Secondary | ICD-10-CM

## 2018-10-20 DIAGNOSIS — E860 Dehydration: Secondary | ICD-10-CM | POA: Diagnosis present

## 2018-10-20 DIAGNOSIS — Z20828 Contact with and (suspected) exposure to other viral communicable diseases: Secondary | ICD-10-CM | POA: Diagnosis present

## 2018-10-20 DIAGNOSIS — R059 Cough, unspecified: Secondary | ICD-10-CM | POA: Diagnosis present

## 2018-10-20 DIAGNOSIS — G20 Parkinson's disease: Secondary | ICD-10-CM | POA: Diagnosis present

## 2018-10-20 DIAGNOSIS — E876 Hypokalemia: Secondary | ICD-10-CM | POA: Diagnosis present

## 2018-10-20 DIAGNOSIS — I5033 Acute on chronic diastolic (congestive) heart failure: Secondary | ICD-10-CM | POA: Diagnosis present

## 2018-10-20 DIAGNOSIS — I161 Hypertensive emergency: Secondary | ICD-10-CM

## 2018-10-20 DIAGNOSIS — R001 Bradycardia, unspecified: Secondary | ICD-10-CM | POA: Diagnosis present

## 2018-10-20 DIAGNOSIS — N183 Chronic kidney disease, stage 3 unspecified: Secondary | ICD-10-CM | POA: Diagnosis present

## 2018-10-20 DIAGNOSIS — I69354 Hemiplegia and hemiparesis following cerebral infarction affecting left non-dominant side: Secondary | ICD-10-CM

## 2018-10-20 DIAGNOSIS — Z972 Presence of dental prosthetic device (complete) (partial): Secondary | ICD-10-CM

## 2018-10-20 DIAGNOSIS — Z823 Family history of stroke: Secondary | ICD-10-CM

## 2018-10-20 DIAGNOSIS — Z87891 Personal history of nicotine dependence: Secondary | ICD-10-CM

## 2018-10-20 DIAGNOSIS — I480 Paroxysmal atrial fibrillation: Secondary | ICD-10-CM | POA: Diagnosis present

## 2018-10-20 DIAGNOSIS — M1711 Unilateral primary osteoarthritis, right knee: Secondary | ICD-10-CM | POA: Diagnosis present

## 2018-10-20 DIAGNOSIS — I130 Hypertensive heart and chronic kidney disease with heart failure and stage 1 through stage 4 chronic kidney disease, or unspecified chronic kidney disease: Secondary | ICD-10-CM | POA: Diagnosis present

## 2018-10-20 DIAGNOSIS — N401 Enlarged prostate with lower urinary tract symptoms: Secondary | ICD-10-CM | POA: Diagnosis present

## 2018-10-20 DIAGNOSIS — Z79899 Other long term (current) drug therapy: Secondary | ICD-10-CM

## 2018-10-20 DIAGNOSIS — N179 Acute kidney failure, unspecified: Secondary | ICD-10-CM | POA: Diagnosis present

## 2018-10-20 LAB — URINALYSIS, ROUTINE W REFLEX MICROSCOPIC
Bilirubin Urine: NEGATIVE
Glucose, UA: NEGATIVE mg/dL
Ketones, ur: NEGATIVE mg/dL
Nitrite: NEGATIVE
Protein, ur: NEGATIVE mg/dL
Specific Gravity, Urine: 1.005 (ref 1.005–1.030)
WBC, UA: >50 WBC/hpf — ABNORMAL HIGH (ref 0–5)
pH: 6 (ref 5.0–8.0)

## 2018-10-20 LAB — CBC WITH DIFFERENTIAL/PLATELET
Abs Immature Granulocytes: 0.02 10*3/uL (ref 0.00–0.07)
Basophils Absolute: 0.1 10*3/uL (ref 0.0–0.1)
Basophils Relative: 1 %
Eosinophils Absolute: 0.2 10*3/uL (ref 0.0–0.5)
Eosinophils Relative: 2 %
HCT: 41.6 % (ref 39.0–52.0)
Hemoglobin: 13.5 g/dL (ref 13.0–17.0)
Immature Granulocytes: 0 %
Lymphocytes Relative: 18 %
Lymphs Abs: 1.5 10*3/uL (ref 0.7–4.0)
MCH: 31 pg (ref 26.0–34.0)
MCHC: 32.5 g/dL (ref 30.0–36.0)
MCV: 95.6 fL (ref 80.0–100.0)
Monocytes Absolute: 0.9 10*3/uL (ref 0.1–1.0)
Monocytes Relative: 11 %
Neutro Abs: 5.8 10*3/uL (ref 1.7–7.7)
Neutrophils Relative %: 68 %
Platelets: 196 10*3/uL (ref 150–400)
RBC: 4.35 MIL/uL (ref 4.22–5.81)
RDW: 13.3 % (ref 11.5–15.5)
WBC: 8.5 10*3/uL (ref 4.0–10.5)
nRBC: 0 % (ref 0.0–0.2)

## 2018-10-20 LAB — COMPREHENSIVE METABOLIC PANEL
ALT: 14 U/L (ref 0–44)
AST: 34 U/L (ref 15–41)
Albumin: 3.8 g/dL (ref 3.5–5.0)
Alkaline Phosphatase: 56 U/L (ref 38–126)
Anion gap: 9 (ref 5–15)
BUN: 22 mg/dL (ref 8–23)
CO2: 24 mmol/L (ref 22–32)
Calcium: 9.5 mg/dL (ref 8.9–10.3)
Chloride: 105 mmol/L (ref 98–111)
Creatinine, Ser: 1.69 mg/dL — ABNORMAL HIGH (ref 0.61–1.24)
GFR calc Af Amer: 42 mL/min — ABNORMAL LOW (ref >60)
GFR calc non Af Amer: 36 mL/min — ABNORMAL LOW (ref >60)
Glucose, Bld: 95 mg/dL (ref 70–99)
Potassium: 4.2 mmol/L (ref 3.5–5.1)
Sodium: 138 mmol/L (ref 135–145)
Total Bilirubin: 1 mg/dL (ref 0.3–1.2)
Total Protein: 7.2 g/dL (ref 6.5–8.1)

## 2018-10-20 LAB — LACTIC ACID, PLASMA
Lactic Acid, Venous: 1.3 mmol/L (ref 0.5–1.9)
Lactic Acid, Venous: 0.7 mmol/L (ref 0.5–1.9)

## 2018-10-20 LAB — BRAIN NATRIURETIC PEPTIDE: B Natriuretic Peptide: 235.9 pg/mL — ABNORMAL HIGH (ref 0.0–100.0)

## 2018-10-20 LAB — TROPONIN I: Troponin I: <0.03 ng/mL (ref <0.03)

## 2018-10-20 LAB — SARS CORONAVIRUS 2 BY RT PCR (HOSPITAL ORDER, PERFORMED IN ~~LOC~~ HOSPITAL LAB): SARS Coronavirus 2: NEGATIVE

## 2018-10-20 MED ORDER — ONDANSETRON HCL 4 MG PO TABS: 4.0000 mg | ORAL_TABLET | Freq: Four times a day (QID) | ORAL | Status: DC | PRN

## 2018-10-20 MED ORDER — SODIUM CHLORIDE 0.9 % IV SOLN
1.0000 g | INTRAVENOUS | Status: DC
  Administered 2018-10-21 – 2018-10-24 (×5): 1 g via INTRAVENOUS
  Filled 2018-10-20 (×5): qty 10

## 2018-10-20 MED ORDER — ACETAMINOPHEN 325 MG PO TABS
650.0000 mg | ORAL_TABLET | Freq: Four times a day (QID) | ORAL | Status: DC | PRN
  Administered 2018-10-21 (×2): 650 mg via ORAL
  Filled 2018-10-20 (×2): qty 2

## 2018-10-20 MED ORDER — FUROSEMIDE 10 MG/ML IJ SOLN
40.0000 mg | Freq: Once | INTRAMUSCULAR | Status: AC
  Administered 2018-10-21: 40 mg via INTRAVENOUS
  Filled 2018-10-20: qty 4

## 2018-10-20 MED ORDER — ALBUTEROL SULFATE (2.5 MG/3ML) 0.083% IN NEBU: 3.0000 mL | INHALATION_SOLUTION | RESPIRATORY_TRACT | Status: DC | PRN

## 2018-10-20 MED ORDER — CALCIUM CARBONATE ANTACID 500 MG PO CHEW
2.0000 | CHEWABLE_TABLET | Freq: Three times a day (TID) | ORAL | Status: DC
  Administered 2018-10-21 – 2018-10-26 (×15): 400 mg via ORAL
  Filled 2018-10-20 (×15): qty 2

## 2018-10-20 MED ORDER — AMLODIPINE BESYLATE 2.5 MG PO TABS
5.0000 mg | ORAL_TABLET | Freq: Every day | ORAL | Status: DC
  Administered 2018-10-21 (×2): 5 mg via ORAL
  Filled 2018-10-20: qty 1
  Filled 2018-10-20: qty 2

## 2018-10-20 MED ORDER — ONDANSETRON HCL 4 MG/2ML IJ SOLN: 4.0000 mg | Freq: Four times a day (QID) | INTRAMUSCULAR | Status: DC | PRN

## 2018-10-20 MED ORDER — DM-GUAIFENESIN ER 30-600 MG PO TB12: 1.0000 | ORAL_TABLET | Freq: Two times a day (BID) | ORAL | Status: DC | PRN

## 2018-10-20 MED ORDER — ACETAMINOPHEN 650 MG RE SUPP: 650.0000 mg | Freq: Four times a day (QID) | RECTAL | Status: DC | PRN

## 2018-10-20 MED ORDER — HYDRALAZINE HCL 20 MG/ML IJ SOLN
5.0000 mg | INTRAMUSCULAR | Status: DC | PRN
  Administered 2018-10-21 – 2018-10-22 (×3): 5 mg via INTRAVENOUS
  Filled 2018-10-20 (×3): qty 1

## 2018-10-20 MED ORDER — TAMSULOSIN HCL 0.4 MG PO CAPS
0.8000 mg | ORAL_CAPSULE | Freq: Every day | ORAL | Status: DC
  Administered 2018-10-21 – 2018-10-25 (×5): 0.8 mg via ORAL
  Filled 2018-10-20 (×7): qty 2

## 2018-10-20 MED ORDER — HYDRALAZINE HCL 20 MG/ML IJ SOLN
5.0000 mg | Freq: Once | INTRAMUSCULAR | Status: AC
  Administered 2018-10-20: 22:00:00 5 mg via INTRAVENOUS
  Filled 2018-10-20: qty 1

## 2018-10-20 NOTE — ED Notes (Signed)
[phone] pts son Daelynn Rodd wants a pt update

## 2018-10-20 NOTE — H&P (Signed)
History and Physical    [name] ZES:[id number] DOB: [birth year] DOA: 10/20/2018  Referring MD/NP/PA:   PCP: Gordan Payment., MD   Patient coming from:  The patient is coming from home.  At baseline, pt is independent for most of ADL.        Chief Complaint: cough, SOB and leg edema  HPI: [name] is a 85 y.o. male with medical history significant of hypertension, hyperlipidemia, stroke with mild left-sided weakness, BPH, PAF not on anticoagulants, Parkinson's disease, pontine hemorrhage 2018, CKD-3, who presents with cough, SOB and leg edema.  Pt has been having cough for more than 2 weeks. He also has mild exertional shortness of breath.  Denies chest pain, fever or chills.  He states that he coughs up yellow-colored sputum.  No fever. Has chills.  Denies chest pain.  Patient has generalized weakness and malaise.  Patient denies nausea vomiting, diarrhea, abdominal pain.  He also denies dysuria or burning on urination.  No increased urinary frequency. He has a history of stroke with mild left-sided weakness,which has not changed. He currently denies any headache, vision changes. Pt was seen by PCP today and had CXR which showed possible PAN, and sent to ED for further evaluation and treatment. Pt has bilateral leg edema.   ED Course: pt was found to have BNP 235, negative troponin, lactic acid 1.3, 0.7, positive urinalysis (cloudy appearance, large amount of leukocyte, few bacteria WBC>50), slightly worsening renal function, temperature normal, elevated blood pressure 211/85, bradycardia with heart rates down to 30s-->60s currently, oxygen saturation 94% on room air.  Chest x-ray with cardiomegaly with interstitial pulmonary edema.  Patient is placed on telemetry bed for observation.  Review of Systems:   General: no fevers, has chills, no body weight gain, has poor appetite, has fatigue HEENT: no blurry vision, hearing changes or sore throat Respiratory: has dyspnea, coughing, no  wheezing CV: no chest pain, no palpitations GI: no nausea, vomiting, abdominal pain, diarrhea, constipation GU: no dysuria, burning on urination, increased urinary frequency, hematuria  Ext: has leg edema Neuro: no unilateral weakness, numbness, or tingling, no vision change or hearing loss Skin: no rash, no skin tear. MSK: No muscle spasm, no deformity, no limitation of range of movement in spin Heme: No easy bruising.  Travel history: No recent long distant travel.  Allergy:  Allergies  Allergen Reactions  . Phenytoin Sodium Extended Other (See Comments)    Caused a fever  . Statins Other (See Comments)    Muscle pains    Past Medical History:  Diagnosis Date  . Benign prostatic hyperplasia (BPH) with urinary urgency   . DDD (degenerative disc disease), lumbar   . Hypertension   . Knee pain   . PAF (paroxysmal atrial fibrillation) (HCC)   . Parkinson's disease (HCC)   . Rhinitis   . Stroke [facility])     Past Surgical History:  Procedure Laterality Date  . HERNIA REPAIR      Social History:  reports that he has never smoked. He has quit using smokeless tobacco. He reports that he does not drink alcohol or use drugs.  Family History:  Family History  Problem Relation Age of Onset  . Stomach cancer Mother   . Cancer Father   . Cancer Sister   . Stroke Sister   . Stroke Brother      Prior to Admission medications   Medication Sig Start Date End Date Taking? Authorizing Provider  acetaminophen (TYLENOL) 325 MG tablet Take 325-650  mg by mouth every 6 (six) hours as needed (for pain or headaches).    [provider]  bethanechol (URECHOLINE) 25 MG tablet Take 1 tablet (25 mg total) by mouth 3 (three) times daily. 09/03/16   Love, Evlyn Kanner, PA-C  bisoprolol (ZEBETA) 10 MG tablet Take 1 tablet (10 mg total) by mouth daily. 09/03/16   Love, Evlyn Kanner, PA-C  calcium carbonate (TUMS - DOSED IN MG ELEMENTAL CALCIUM) 500 MG chewable tablet Chew 2 tablets by mouth 3 (three)  times daily after meals.    [provider]  cephALEXin (KEFLEX) 250 MG capsule Take 1 capsule (250 mg total) by mouth every 8 (eight) hours. Patient not taking: Reported on 01/20/2017 09/03/16   Love, Evlyn Kanner, PA-C  furosemide (LASIX) 20 MG tablet Take 1 tablet (20 mg total) by mouth daily. 11/07/16   Marijean Niemann, MD  potassium chloride (K-DUR) 10 MEQ tablet Take 1 tablet (10 mEq total) by mouth 2 (two) times daily. 11/06/16   Marijean Niemann, MD  senna-docusate (SENOKOT-S) 8.6-50 MG tablet Take 2 tablets by mouth at bedtime. 09/03/16   Love, Evlyn Kanner, PA-C  tamsulosin (FLOMAX) 0.4 MG CAPS capsule Take 2 capsules (0.8 mg total) by mouth daily after supper. 09/03/16   Jacquelynn Cree, PA-C    Physical Exam: Vitals:   10/20/18 2200 10/20/18 2215 10/20/18 2230 10/20/18 2245  BP: (!) 179/65 (!) 185/100 (!) 180/75 (!) 196/104  Pulse: (!) 54 (!) 57 (!) 49 74  Resp: Temp:      TempSrc:      SpO2: 95% 96% 98% 97%  Weight:      Height:       General: Not in acute distress HEENT:       Eyes: PERRL, EOMI, no scleral icterus.       ENT: No discharge from the ears and nose, no pharynx injection, no tonsillar enlargement.        Neck: positive JVD, no bruit, no mass felt. Heme: No neck lymph node enlargement. Cardiac: S1/S2, RRR, No murmurs, No gallops or rubs. Respiratory: has crackles bilaterally GI: Soft, nondistended, nontender, no rebound pain, no organomegaly, BS present. GU: No hematuria Ext: 2+ pitting leg edema bilaterally. 2+DP/PT pulse bilaterally. Musculoskeletal: No joint deformities, No joint redness or warmth, no limitation of ROM in spin. Skin: No rashes.  Neuro: Alert, oriented X3, cranial nerves II-XII grossly intact, has mild left sided weakness. Psych: Patient is not psychotic, no suicidal or hemocidal ideation.  Labs on Admission: I have personally reviewed following labs and imaging studies  CBC: Recent Labs  Lab 10/20/18 1820  WBC 8.5  NEUTROABS  5.8  HGB 13.5  HCT 41.6  MCV 95.6  PLT 196   Basic Metabolic Panel: Recent Labs  Lab 10/20/18 1820  NA 138  K 4.2  CL 105  CO2 24  GLUCOSE 95  BUN 22  CREATININE 1.69*  CALCIUM 9.5   GFR: Estimated Creatinine Clearance: 32 mL/min (A) (by C-G formula based on SCr of 1.69 mg/dL (H)). Liver Function Tests: Recent Labs  Lab 10/20/18 1820  AST 34  ALT 14  ALKPHOS 56  BILITOT 1.0  PROT 7.2  ALBUMIN 3.8   No results for input(s): LIPASE, AMYLASE in the last 168 hours. No results for input(s): AMMONIA in the last 168 hours. Coagulation Profile: No results for input(s): INR, PROTIME in the last 168 hours. Cardiac Enzymes: Recent Labs  Lab 10/20/18 1820  TROPONINI <0.03  BNP (last 3 results) No results for input(s): PROBNP in the last 8760 hours. HbA1C: No results for input(s): HGBA1C in the last 72 hours. CBG: No results for input(s): GLUCAP in the last 168 hours. Lipid Profile: No results for input(s): CHOL, HDL, LDLCALC, TRIG, CHOLHDL, LDLDIRECT in the last 72 hours. Thyroid Function Tests: No results for input(s): TSH, T4TOTAL, FREET4, T3FREE, THYROIDAB in the last 72 hours. Anemia Panel: No results for input(s): VITAMINB12, FOLATE, FERRITIN, TIBC, IRON, RETICCTPCT in the last 72 hours. Urine analysis:    Component Value Date/Time   COLORURINE YELLOW 10/20/2018 1900   APPEARANCEUR CLOUDY (A) 10/20/2018 1900   LABSPEC 1.005 10/20/2018 1900   PHURINE 6.0 10/20/2018 1900   GLUCOSEU NEGATIVE 10/20/2018 1900   HGBUR SMALL (A) 10/20/2018 1900   BILIRUBINUR NEGATIVE 10/20/2018 1900   KETONESUR NEGATIVE 10/20/2018 1900   PROTEINUR NEGATIVE 10/20/2018 1900   NITRITE NEGATIVE 10/20/2018 1900   LEUKOCYTESUR LARGE (A) 10/20/2018 1900   Sepsis Labs: (procalcitonin:4,lacticidven:4) ) Recent Results (from the past 240 hour(s))  SARS Coronavirus 2 (CEPHEID- Performed in [facility] hospital lab), Hosp Order     Status: None   Collection Time: 10/20/18   6:34 PM  Result Value Ref Range Status   SARS Coronavirus 2 NEGATIVE NEGATIVE Final    Comment: (NOTE) If result is NEGATIVE SARS-CoV-2 target nucleic acids are NOT DETECTED. The SARS-CoV-2 RNA is generally detectable in upper and lower  respiratory specimens during the acute phase of infection. The lowest  concentration of SARS-CoV-2 viral copies this assay can detect is 250  copies / mL. A negative result does not preclude SARS-CoV-2 infection  and should not be used as the sole basis for treatment or other  patient management decisions.  A negative result may occur with  improper specimen collection / handling, submission of specimen other  than nasopharyngeal swab, presence of viral mutation(s) within the  areas targeted by this assay, and inadequate number of viral copies  (<250 copies / mL). A negative result must be combined with clinical  observations, patient history, and epidemiological information. If result is POSITIVE SARS-CoV-2 target nucleic acids are DETECTED. The SARS-CoV-2 RNA is generally detectable in upper and lower  respiratory specimens dur ing the acute phase of infection.  Positive  results are indicative of active infection with SARS-CoV-2.  Clinical  correlation with patient history and other diagnostic information is  necessary to determine patient infection status.  Positive results do  not rule out bacterial infection or co-infection with other viruses. If result is PRESUMPTIVE POSTIVE SARS-CoV-2 nucleic acids MAY BE PRESENT.   A presumptive positive result was obtained on the submitted specimen  and confirmed on repeat testing.  While 2019 novel coronavirus  (SARS-CoV-2) nucleic acids may be present in the submitted sample  additional confirmatory testing may be necessary for epidemiological  and / or clinical management purposes  to differentiate between  SARS-CoV-2 and other Sarbecovirus currently known to infect humans.  If clinically indicated  additional testing with an alternate test  methodology [id number]) is advised. The SARS-CoV-2 RNA is generally  detectable in upper and lower respiratory sp ecimens during the acute  phase of infection. The expected result is Negative. Fact Sheet for Patients:  BoilerBrush.com.cy Fact Sheet for Healthcare Providers: https://pope.com/ This test is not yet approved or cleared by the [place] FDA and has been authorized for detection and/or diagnosis of SARS-CoV-2 by FDA under an Emergency Use Authorization (EUA).  This EUA will remain in effect (meaning  this test can be used) for the duration of the COVID-19 declaration under Section 564(b)(1) of the Act, 21 U.S.C. section 360bbb-3(b)(1), unless the authorization is terminated or revoked sooner. Performed at [facility], [address] [place], [place] [address]      Radiological Exams on Admission: Dg Chest 2 View  Result Date: 10/20/2018 CLINICAL DATA:  Productive cough for 2 weeks. EXAM: CHEST - 2 VIEW COMPARISON:  PA and lateral chest 10/20/2018, 08/13/2016 and 09/05/2015. Single-view of the chest 01/20/2017. FINDINGS: There is cardiomegaly. Mild prominence of the pulmonary interstitium is somewhat more extensive on the right. No consolidative process, pneumothorax or effusion. Aortic atherosclerosis is noted. No acute or focal bony abnormality. IMPRESSION: Cardiomegaly and mild interstitial prominence likely due to edema. The appearance is unchanged since the examination earlier today. Electronically Signed   By: Drusilla Kanner M.D.   On: 10/20/2018 18:55     EKG: Independently reviewed.  Atrial fibrillation, QTc 445, anteroseptal infarction pattern, low voltage, poor R wave progression.   Assessment/Plan Principal Problem:   Hypertensive urgency Active Problems:   PAF (paroxysmal atrial fibrillation) (HCC)   History of CVA (cerebrovascular accident)   CKD (chronic  kidney disease), stage III (HCC)   UTI (urinary tract infection)   Cough   Hypertensive urgency: Bp 211/85. 5 mg of IV hydralazine was given in ED, improved to 193/86. Pt may have Acute diastolic CHF given elevated BNP 235, bilateral 2+ leg edema, positive JVD and interstitial pulmonary edema on chest x-ray.  2D echo on 08/09/2016 showed EF of 55 to 60%.  Patient is taking low-dose Lasix 20 mg daily at home, but does not carry CHF diagnosis on his med problem list.   -will place on tele bed for obs -give 40 mg of lasix by IV now--> reevaluate volume status in the morning to decide further diuresis. -2D echo -Hold Zebeta due to bradycardia -add amlodipine 5 mg daily -IV hydralazine as needed  PAF (paroxysmal atrial fibrillation) (HCC): CHA2DS2-VASc Score is 5, needs oral anticoagulation, but pt is not on AC, possibly due to history of pontine hemorrhage. Pt has bradycardia in the ED, with heart rate down to 30s, which is likely due to hypertensive urgency.  Currently heart rate is in 60s. -Hold Zebeta  History of CVA (cerebrovascular accident) and hx of pontine hemorrhage 2018: Has mild left-sided weakness.  No acute focal neurological findings, will hold off imaging at this moment. -Blood pressure control as above  CKD (chronic kidney disease), stage III [facility]): Renal function slightly worsening than baseline.  Baseline creatinine 1.3- 1.4.  His creatinine is 1.69, BUN 22. -Follow-up BMP  Cough: The cough can be partially explained by the possible CHF and pulmonary edema, but his cough is productive with yellow-colored mucus production.  Patient may have mild bronchitis.  Chest x-ray did not show infiltration. -Will start Rocephin which also covers possible UTI since urinalysis is positive. -Check flu PCR and respiratory virus panel -Get sputum culture -As needed albuterol inhaler and Mucinex  Questionable UTI (urinary tract infection): Patient has positive urinalysis with cloudy  appearance, large amount of leukocyte, few bacteria, WBC>50.  He denies typical symptoms of UTI, but the patient has generalized weakness. -On Rocephin as above - follow-up of blood culture and urine culture     DVT ppx: SCD Code Status: Full code (I have tried to discuss with the patient about his CODE STATUS, tried hard to explain the meaning of CODE STATUS, but patient seems to have  difficulty understanding, cannot make decision by himself.  I also tried to call his son without success.  Will temporarily order full code.  This need to be discussed with family in AM). Family Communication: None at bed side.  Disposition Plan:  Anticipate discharge back to previous home environment Consults called:  none Admission status: Obs / tele    Date of Service 10/20/2018    Lorretta HarpXilin Nyheem Binette [facility]   If 7PM-7AM, please contact night-coverage [email or web address] Password [facility] 10/20/2018, 11:22 PM

## 2018-10-20 NOTE — ED Notes (Addendum)
Called Son to give update.  Son confirms prior deficits on left side from "old strokes".  Will call with further update concerning further treatment.

## 2018-10-20 NOTE — ED Notes (Signed)
RN once again called son, Dorinda Hill and updated him to his son's current condition.  Informed him he is going to be admitted for hypertension emergency or high blood pressure.  Son appears to understand and did not have any further questions at this time.

## 2018-10-20 NOTE — ED Provider Notes (Signed)
[facility] EMERGENCY DEPARTMENT Provider Note   CSN: [id number] Arrival date & time: 10/20/18  1721    History   Chief Complaint Chief Complaint  Patient presents with  . Pneumonia    HPI [name] is a 85 y.o. male.     The history is provided by the patient and medical records. No language interpreter was used.  Shortness of Breath  Severity:  Moderate Onset quality:  Gradual Duration:  2 weeks Timing:  Intermittent Progression:  Waxing and waning Chronicity:  New Context: activity   Relieved by:  Nothing Worsened by:  Exertion Ineffective treatments:  None tried Associated symptoms: cough and sputum production (yellow)   Associated symptoms: no abdominal pain, no chest pain, no diaphoresis, no fever, no headaches, no hemoptysis, no neck pain, no rash, no vomiting and no wheezing   Risk factors: no hx of PE/DVT     Past Medical History:  Diagnosis Date  . Benign prostatic hyperplasia (BPH) with urinary urgency   . DDD (degenerative disc disease), lumbar   . Hypertension   . Knee pain   . PAF (paroxysmal atrial fibrillation) (HCC)   . Parkinson's disease (HCC)   . Rhinitis   . Stroke [facility])     Patient Active Problem List   Diagnosis Date Noted  . Gait disturbance, post-stroke 10/11/2016  . Urinary retention with incomplete bladder emptying 09/03/2016  . E. coli UTI 09/03/2016  . 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 Hyperlipidemia 08/12/2016  . Hypertensive emergency   . Benign essential HTN   . PAF (paroxysmal atrial fibrillation) (HCC)   . History of CVA (cerebrovascular accident)   . Parkinson disease (HCC)   . Primary osteoarthritis of right knee   . Leukocytosis   . AKI (acute kidney injury) [facility])     Past Surgical History:  Procedure Laterality Date  . HERNIA REPAIR          Home Medications    Prior to Admission medications   Medication Sig Start Date End Date Taking? Authorizing  Provider  acetaminophen (TYLENOL) 325 MG tablet Take 325-650 mg by mouth every 6 (six) hours as needed (for pain or headaches).    [provider]  bethanechol (URECHOLINE) 25 MG tablet Take 1 tablet (25 mg total) by mouth 3 (three) times daily. 09/03/16   Love, Evlyn Kanner, PA-C  bisoprolol (ZEBETA) 10 MG tablet Take 1 tablet (10 mg total) by mouth daily. 09/03/16   Love, Evlyn Kanner, PA-C  calcium carbonate (TUMS - DOSED IN MG ELEMENTAL CALCIUM) 500 MG chewable tablet Chew 2 tablets by mouth 3 (three) times daily after meals.    [provider]  cephALEXin (KEFLEX) 250 MG capsule Take 1 capsule (250 mg total) by mouth every 8 (eight) hours. Patient not taking: Reported on 01/20/2017 09/03/16   Love, Evlyn Kanner, PA-C  furosemide (LASIX) 20 MG tablet Take 1 tablet (20 mg total) by mouth daily. 11/07/16   Marijean Niemann, MD  potassium chloride (K-DUR) 10 MEQ tablet Take 1 tablet (10 mEq total) by mouth 2 (two) times daily. 11/06/16   Marijean Niemann, MD  senna-docusate (SENOKOT-S) 8.6-50 MG tablet Take 2 tablets by mouth at bedtime. 09/03/16   Love, Evlyn Kanner, PA-C  tamsulosin (FLOMAX) 0.4 MG CAPS capsule Take 2 capsules (0.8 mg total) by mouth daily after supper. 09/03/16   Jacquelynn Cree, PA-C    Family History Family History  Problem Relation Age of Onset  .  Stomach cancer Mother   . Cancer Father   . Cancer Sister   . Stroke Sister   . Stroke Brother     Social History Social History   Tobacco Use  . Smoking status: Never Smoker  . Smokeless tobacco: Former Engineer, waterUser  Substance Use Topics  . Alcohol use: No  . Drug use: No     Allergies   Phenytoin sodium extended and Statins   Review of Systems Review of Systems  Constitutional: Positive for chills and fatigue. Negative for diaphoresis and fever.  HENT: Negative for congestion.   Eyes: Negative for visual disturbance.  Respiratory: Positive for cough, sputum production (yellow) and shortness of breath. Negative for  hemoptysis, choking, chest tightness, wheezing and stridor.   Cardiovascular: Positive for leg swelling (mild bilateral). Negative for chest pain and palpitations.  Gastrointestinal: Negative for abdominal pain, constipation, diarrhea, nausea and vomiting.  Genitourinary: Negative for flank pain and frequency.  Musculoskeletal: Negative for back pain, neck pain and neck stiffness.  Skin: Negative for rash and wound.  Neurological: Negative for dizziness, light-headedness and headaches.  Psychiatric/Behavioral: Negative for confusion.  All other systems reviewed and are negative.    Physical Exam Updated Vital Signs BP (!) 200/79 (BP Location: Left Arm)   Pulse (!) 55   Temp 97.8 F (36.6 C) (Oral)   Resp 18   Ht 5\' 9"  (1.753 m)   Wt 77.1 kg   SpO2 100%   BMI 25.10 kg/m   Physical Exam Vitals signs and nursing note reviewed.  Constitutional:      General: He is not in acute distress.    Appearance: He is well-developed. He is not ill-appearing, toxic-appearing or diaphoretic.  HENT:     Head: Normocephalic and atraumatic.     Mouth/Throat:     Pharynx: No oropharyngeal exudate or posterior oropharyngeal erythema.  Eyes:     Extraocular Movements: Extraocular movements intact.     Conjunctiva/sclera: Conjunctivae normal.     Pupils: Pupils are equal, round, and reactive to light.  Neck:     Musculoskeletal: Neck supple. No muscular tenderness.  Cardiovascular:     Rate and Rhythm: Normal rate and regular rhythm.     Heart sounds: No murmur.  Pulmonary:     Effort: Pulmonary effort is normal. No respiratory distress.     Breath sounds: Rhonchi and rales present. No wheezing.  Chest:     Chest wall: No tenderness.  Abdominal:     General: Abdomen is flat. There is no distension.     Palpations: Abdomen is soft.     Tenderness: There is no abdominal tenderness. There is no right CVA tenderness or left CVA tenderness.  Musculoskeletal:        General: No tenderness.      Right lower leg: Edema present.     Left lower leg: Edema present.  Skin:    General: Skin is warm and dry.     Capillary Refill: Capillary refill takes less than 2 seconds.  Neurological:     Mental Status: He is alert. Mental status is at baseline.     Motor: Weakness (at baseline with left arm ) present.  Psychiatric:        Mood and Affect: Mood normal.      ED Treatments / Results  Labs (all labs ordered are listed, but only abnormal results are displayed) Labs Reviewed  COMPREHENSIVE METABOLIC PANEL - Abnormal; Notable for the following components:      Result  Value   Creatinine, Ser 1.69 (*)    GFR calc non Af Amer 36 (*)    GFR calc Af Amer 42 (*)    All other components within normal limits  BRAIN NATRIURETIC PEPTIDE - Abnormal; Notable for the following components:   B Natriuretic Peptide 235.9 (*)    All other components within normal limits  URINALYSIS, ROUTINE W REFLEX MICROSCOPIC - Abnormal; Notable for the following components:   APPearance CLOUDY (*)    Hgb urine dipstick SMALL (*)    Leukocytes,Ua LARGE (*)    WBC, UA >50 (*)    Bacteria, UA FEW (*)    All other components within normal limits  SARS CORONAVIRUS 2 (HOSPITAL ORDER, PERFORMED IN [facility])  CULTURE, BLOOD (ROUTINE X 2)  CULTURE, BLOOD (ROUTINE X 2)  URINE CULTURE  RESPIRATORY PANEL BY PCR  EXPECTORATED SPUTUM ASSESSMENT W REFEX TO RESP CULTURE  LACTIC ACID, PLASMA  LACTIC ACID, PLASMA  CBC WITH DIFFERENTIAL/PLATELET  TROPONIN I  INFLUENZA PANEL BY PCR (TYPE A & B)  BASIC METABOLIC PANEL  CBC    EKG EKG Interpretation  Date/Time:  Tuesday Oct 20 2018 18:32:27 EDT Ventricular Rate:  62 PR Interval:    QRS Duration: 114 QT Interval:  438 QTC Calculation: 445 R Axis:   83 Text Interpretation:  Atrial fibrillation Anterior infarct, old When compared to prior, similar afib.  No STEMI Confirmed by Theda Belfast ([id number]) on 10/20/2018 7:00:59 PM   Radiology Dg Chest  2 View  Result Date: 10/20/2018 CLINICAL DATA:  Productive cough for 2 weeks. EXAM: CHEST - 2 VIEW COMPARISON:  PA and lateral chest 10/20/2018, 08/13/2016 and 09/05/2015. Single-view of the chest 01/20/2017. FINDINGS: There is cardiomegaly. Mild prominence of the pulmonary interstitium is somewhat more extensive on the right. No consolidative process, pneumothorax or effusion. Aortic atherosclerosis is noted. No acute or focal bony abnormality. IMPRESSION: Cardiomegaly and mild interstitial prominence likely due to edema. The appearance is unchanged since the examination earlier today. Electronically Signed   By: Drusilla Kanner M.D.   On: 10/20/2018 18:55    Procedures Procedures (including critical care time)  Medications Ordered in ED Medications  albuterol (PROVENTIL) (2.5 MG/3ML) 0.083% nebulizer solution 3 mL (has no administration in time range)  dextromethorphan-guaiFENesin (MUCINEX DM) 30-600 MG per 12 hr tablet 1 tablet (has no administration in time range)  cefTRIAXone (ROCEPHIN) 1 g in sodium chloride 0.9 % 100 mL IVPB (has no administration in time range)  calcium carbonate (TUMS - dosed in mg elemental calcium) chewable tablet 400 mg of elemental calcium (has no administration in time range)  tamsulosin (FLOMAX) capsule 0.8 mg (has no administration in time range)  hydrALAZINE (APRESOLINE) injection 5 mg (has no administration in time range)  amLODipine (NORVASC) tablet 5 mg (has no administration in time range)  acetaminophen (TYLENOL) tablet 650 mg (has no administration in time range)    Or  acetaminophen (TYLENOL) suppository 650 mg (has no administration in time range)  ondansetron (ZOFRAN) tablet 4 mg (has no administration in time range)    Or  ondansetron (ZOFRAN) injection 4 mg (has no administration in time range)  hydrALAZINE (APRESOLINE) injection 5 mg (5 mg Intravenous Given 10/20/18 2141)  furosemide (LASIX) injection 40 mg (40 mg Intravenous Given 10/21/18 0014)      [name] was evaluated in Emergency Department on 10/20/2018 for the symptoms described in the history of present illness. He was evaluated in the context of the global COVID-19  pandemic, which necessitated consideration that the patient might be at risk for infection with the SARS-CoV-2 virus that causes COVID-19. Institutional protocols and algorithms that pertain to the evaluation of patients at risk for COVID-19 are in a state of rapid change based on information released by regulatory bodies including the CDC and federal and state organizations. These policies and algorithms were followed during the patient's care in the ED.   CRITICAL CARE Performed by: Canary Brim Tegeler Total critical care time: 35 minutes Critical care time was exclusive of separately billable procedures and treating other patients. Critical care was necessary to treat or prevent imminent or life-threatening deterioration. Critical care was time spent personally by me on the following activities: development of treatment plan with patient and/or surrogate as well as nursing, discussions with consultants, evaluation of patient's response to treatment, examination of patient, obtaining history from patient or surrogate, ordering and performing treatments and interventions, ordering and review of laboratory studies, ordering and review of radiographic studies, pulse oximetry and re-evaluation of patient's condition.   Initial Impression / Assessment and Plan / ED Course  I have reviewed the triage vital signs and the nursing notes.  Pertinent labs & imaging results that were available during my care of the patient were reviewed by me and considered in my medical decision making (see chart for details).        [name] is a 85 y.o. 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  According to patient, he has had 2 weeks of productive cough and moreso exertional shortness of breath.  To me he does report some mild chills with the cough and shortness of breath with exertion.  He has a history of stroke with left-sided weakness.  He currently denies any headache, vision changes, or change in his numbness, tingling, or weakness at baseline.  He is simply complaining of the cough, malaise, chills, and shortness of breath.  Chart review shows the patient actually had a chest x-ray did not show pneumonia today and does not appear the patient was started on antibiotics.  Patient says that they told him to come in due to his likely pneumonia with worsening respiratory symptoms.  On my exam, patient's lungs do have some coarseness and crackles.  No significant wheezing.  Abdomen and chest are nontender.  Legs have some mild edema.  Is on room air initially.  Given patient symptoms, will repeat imaging and labs to look for etiology of symptoms.  With his mild edema and the shortness of breath, will obtain a BNP to look for fluid overload given the crackles and rhonchi appreciated.  Anticipate reassessment after work-up including ambulatory pulse ox to determine disposition.  Coronavirus test will also be sent.  7:06 PM Chest x-ray returned showing evidence of edema and cardiomegaly but no evidence of pneumonia.  9:34 PM While awaiting the rest of his test results, patient had worsening vital signs.  Patient's heart rate is in A. fib but continued to drop into the 30s.  At one point I saw a heart rate in the 20s.  Patient's blood pressure during this time was actually over 200 systolic.  Patient will be given hydralazine to try and help with his blood pressure.  I was reassessing the patient during this episode and he had no headache or new neurologic complaints.  He still feeling the  shortness of breath.  Other  labs began to return showing his BNP is elevated similar to prior.  Lactic acid is normal, coronavirus test negative.  Kidney function is slightly more elevated than prior.  CBC shows no leukocytosis or anemia.  Clinically I feel patient does not have pneumonia but does have hypertensive emergency causing some pulmonary edema.  Given his age of 37, worsening kidney function, elevated BNP, and vital signs, do not feel he is safe for discharge home.  Patient will be admitted for further management of hypertensive emergency.  Final Clinical Impressions(s) / ED Diagnoses   Final diagnoses:  Hypertensive emergency    ED Discharge Orders    None     Clinical Impression: 1. Hypertensive emergency     Disposition: Admit  This note was prepared with assistance of Dragon voice recognition software. Occasional wrong-word or sound-a-like substitutions may have occurred due to the inherent limitations of voice recognition software.     Tegeler, Canary Brim, MD 10/21/18 [phone]

## 2018-10-20 NOTE — ED Triage Notes (Signed)
Pt sent by dr after having x-ray that confirmed pneumonia, Pt has 2 week hx of productive cough. Denies chills, fever or shob. Had coronvirus swab. Has hx of cva with left side weakness. Afebrile, VSS

## 2018-10-20 NOTE — ED Notes (Signed)
Patient transported to X-ray 

## 2018-10-20 NOTE — ED Notes (Signed)
ED TO INPATIENT HANDOFF REPORT  ED Nurse Name and Phone #: Victorino DikeJennifer RN  S Name/Age/Gender Para [name] 85 y.o. male Room/Bed: 035C/035C  Code Status   Code Status: Full Code  Home/SNF/Other Home Patient oriented to: self, place, time and situation Is this baseline? Yes   Triage Complete: Triage complete  Chief Complaint pneumonia  Triage Note Pt sent by dr after having x-ray that confirmed pneumonia, Pt has 2 week hx of productive cough. Denies chills, fever or shob. Had coronvirus swab. Has hx of cva with left side weakness. Afebrile, VSS   Allergies Allergies  Allergen Reactions  . Phenytoin Sodium Extended Other (See Comments)    Caused a fever  . Statins Other (See Comments)    Muscle pains    Level of Care/Admitting Diagnosis ED Disposition    ED Disposition Condition Comment   Admit  [facility] [[id number]]  Level of Care: Telemetry Cardiac [103]  I expect the patient will be discharged within 24 hours: No (not a candidate for 5C-Observation unit)  Covid Evaluation: N/A  Diagnosis: Hypertensive urgency [161096][[id number]]  Admitting Physician: [name], [name] [[id number]]  Attending Physician: [name], [name] [[id number]]  PT Class (Do Not Modify): Observation [104]  PT Acc Code (Do Not Modify): Observation [10022]       B Medical/Surgery History Past Medical History:  Diagnosis Date  . Benign prostatic hyperplasia (BPH) with urinary urgency   . DDD (degenerative disc disease), lumbar   . Hypertension   . Knee pain   . PAF (paroxysmal atrial fibrillation) (HCC)   . Parkinson's disease (HCC)   . Rhinitis   . Stroke [facility](HCC)    Past Surgical History:  Procedure Laterality Date  . HERNIA REPAIR       A IV Location/Drains/Wounds Patient Lines/Drains/Airways Status   Active Line/Drains/Airways    Name:   Placement date:   Placement time:   Site:   Days:   Peripheral IV 10/20/18 Right Antecubital   10/20/18    1818    Antecubital   less than 1           Intake/Output Last 24 hours No intake or output data in the 24 hours ending 10/20/18 2327  Labs/Imaging Results for orders placed or performed during the hospital encounter of 10/20/18 (from the past 48 hour(s))  Comprehensive metabolic panel     Status: Abnormal   Collection Time: 10/20/18  6:20 PM  Result Value Ref Range   Sodium 138 135 - 145 mmol/L   Potassium 4.2 3.5 - 5.1 mmol/L    Comment: HEMOLYSIS AT THIS LEVEL MAY AFFECT RESULT   Chloride 105 98 - 111 mmol/L   CO2 24 22 - 32 mmol/L   Glucose, Bld 95 70 - 99 mg/dL   BUN 22 8 - 23 mg/dL   Creatinine, Ser 0.451.69 (H) 0.61 - 1.24 mg/dL   Calcium 9.5 8.9 - 40.910.3 mg/dL   Total Protein 7.2 6.5 - 8.1 g/dL   Albumin 3.8 3.5 - 5.0 g/dL   AST 34 15 - 41 U/L   ALT 14 0 - 44 U/L   Alkaline Phosphatase 56 38 - 126 U/L   Total Bilirubin 1.0 0.3 - 1.2 mg/dL   GFR calc non Af Amer 36 (L) >60 mL/min   GFR calc Af Amer 42 (L) >60 mL/min   Anion gap 9 5 - 15    Comment: Performed at [facility], [address] [place], [place] [id number]  CBC with  Differential     Status: None   Collection Time: 10/20/18  6:20 PM  Result Value Ref Range   WBC 8.5 4.0 - 10.5 K/uL   RBC 4.35 4.22 - 5.81 MIL/uL   Hemoglobin 13.5 13.0 - 17.0 g/dL   HCT 16.1 09.6 - 04.5 %   MCV 95.6 80.0 - 100.0 fL   MCH 31.0 26.0 - 34.0 pg   MCHC 32.5 30.0 - 36.0 g/dL   RDW 40.9 81.1 - 91.4 %   Platelets 196 150 - 400 K/uL   nRBC 0.0 0.0 - 0.2 %   Neutrophils Relative % 68 %   Neutro Abs 5.8 1.7 - 7.7 K/uL   Lymphocytes Relative 18 %   Lymphs Abs 1.5 0.7 - 4.0 K/uL   Monocytes Relative 11 %   Monocytes Absolute 0.9 0.1 - 1.0 K/uL   Eosinophils Relative 2 %   Eosinophils Absolute 0.2 0.0 - 0.5 K/uL   Basophils Relative 1 %   Basophils Absolute 0.1 0.0 - 0.1 K/uL   Immature Granulocytes 0 %   Abs Immature Granulocytes 0.02 0.00 - 0.07 K/uL    Comment: Performed at [facility], [address], [place], [place] [address]  Brain natriuretic  peptide     Status: Abnormal   Collection Time: 10/20/18  6:20 PM  Result Value Ref Range   B Natriuretic Peptide 235.9 (H) 0.0 - 100.0 pg/mL    Comment: Performed at [facility], [address] [place], [place] [address]  Troponin I - ONCE - STAT     Status: None   Collection Time: 10/20/18  6:20 PM  Result Value Ref Range   Troponin I <0.03 <0.03 ng/mL    Comment: Performed at [facility], [address], [place], [place] [address]  SARS Coronavirus 2 (CEPHEID- Performed in [facility] hospital lab), Hosp Order     Status: None   Collection Time: 10/20/18  6:34 PM  Result Value Ref Range   SARS Coronavirus 2 NEGATIVE NEGATIVE    Comment: (NOTE) If result is NEGATIVE SARS-CoV-2 target nucleic acids are NOT DETECTED. The SARS-CoV-2 RNA is generally detectable in upper and lower  respiratory specimens during the acute phase of infection. The lowest  concentration of SARS-CoV-2 viral copies this assay can detect is 250  copies / mL. A negative result does not preclude SARS-CoV-2 infection  and should not be used as the sole basis for treatment or other  patient management decisions.  A negative result may occur with  improper specimen collection / handling, submission of specimen other  than nasopharyngeal swab, presence of viral mutation(s) within the  areas targeted by this assay, and inadequate number of viral copies  (<250 copies / mL). A negative result must be combined with clinical  observations, patient history, and epidemiological information. If result is POSITIVE SARS-CoV-2 target nucleic acids are DETECTED. The SARS-CoV-2 RNA is generally detectable in upper and lower  respiratory specimens dur ing the acute phase of infection.  Positive  results are indicative of active infection with SARS-CoV-2.  Clinical  correlation with patient history and other diagnostic information is  necessary to determine patient infection status.  Positive results do  not rule  out bacterial infection or co-infection with other viruses. If result is PRESUMPTIVE POSTIVE SARS-CoV-2 nucleic acids MAY BE PRESENT.   A presumptive positive result was obtained on the submitted specimen  and confirmed on repeat testing.  While 2019 novel coronavirus  (SARS-CoV-2)  nucleic acids may be present in the submitted sample  additional confirmatory testing may be necessary for epidemiological  and / or clinical management purposes  to differentiate between  SARS-CoV-2 and other Sarbecovirus currently known to infect humans.  If clinically indicated additional testing with an alternate test  methodology [phone]) is advised. The SARS-CoV-2 RNA is generally  detectable in upper and lower respiratory sp ecimens during the acute  phase of infection. The expected result is Negative. Fact Sheet for Patients:  BoilerBrush.com.cy Fact Sheet for Healthcare Providers: https://pope.com/ This test is not yet approved or cleared by the [place] FDA and has been authorized for detection and/or diagnosis of SARS-CoV-2 by FDA under an Emergency Use Authorization (EUA).  This EUA will remain in effect (meaning this test can be used) for the duration of the COVID-19 declaration under Section 564(b)(1) of the Act, 21 U.S.C. section 360bbb-3(b)(1), unless the authorization is terminated or revoked sooner. Performed at [facility], [address] [place], [place] [address]   Lactic acid, plasma     Status: None   Collection Time: 10/20/18  7:00 PM  Result Value Ref Range   Lactic Acid, Venous 1.3 0.5 - 1.9 mmol/L    Comment: Performed at [facility], [address] [place], [place] [address]  Urinalysis, Routine w reflex microscopic     Status: Abnormal   Collection Time: 10/20/18  7:00 PM  Result Value Ref Range   Color, Urine YELLOW YELLOW   APPearance CLOUDY (A) CLEAR   Specific Gravity, Urine 1.005 1.005 - 1.030   pH  6.0 5.0 - 8.0   Glucose, UA NEGATIVE NEGATIVE mg/dL   Hgb urine dipstick SMALL (A) NEGATIVE   Bilirubin Urine NEGATIVE NEGATIVE   Ketones, ur NEGATIVE NEGATIVE mg/dL   Protein, ur NEGATIVE NEGATIVE mg/dL   Nitrite NEGATIVE NEGATIVE   Leukocytes,Ua LARGE (A) NEGATIVE   RBC / HPF 0-5 0 - 5 RBC/hpf   WBC, UA >50 (H) 0 - 5 WBC/hpf   Bacteria, UA FEW (A) NONE SEEN   WBC Clumps PRESENT     Comment: Performed at [facility], [address], [place], [place] [address]  Lactic acid, plasma     Status: None   Collection Time: 10/20/18  8:15 PM  Result Value Ref Range   Lactic Acid, Venous 0.7 0.5 - 1.9 mmol/L    Comment: Performed at [facility], [address], [place], [place] [address]   Dg Chest 2 View  Result Date: 10/20/2018 CLINICAL DATA:  Productive cough for 2 weeks. EXAM: CHEST - 2 VIEW COMPARISON:  PA and lateral chest 10/20/2018, 08/13/2016 and 09/05/2015. Single-view of the chest 01/20/2017. FINDINGS: There is cardiomegaly. Mild prominence of the pulmonary interstitium is somewhat more extensive on the right. No consolidative process, pneumothorax or effusion. Aortic atherosclerosis is noted. No acute or focal bony abnormality. IMPRESSION: Cardiomegaly and mild interstitial prominence likely due to edema. The appearance is unchanged since the examination earlier today. Electronically Signed   By: Drusilla Kanner M.D.   On: 10/20/2018 18:55    Pending Labs Unresulted Labs (From admission, onward)    Start     Ordered   10/21/18 0500  Basic metabolic panel  Tomorrow morning,   R     10/20/18 2259   10/21/18 0500  CBC  Tomorrow morning,   R     10/20/18 2259   10/20/18 2257  Expectorated sputum assessment w rflx to resp cult  Once,   R     10/20/18 2256   10/20/18 2255  Influenza panel by PCR (type A & B)  Add-on,   R     10/20/18 2254   10/20/18 2255  Respiratory Panel by PCR  (Respiratory virus panel with precautions)  Add-on,   R     10/20/18 2254   10/20/18  1822  Urine culture  ONCE - STAT,   STAT     10/20/18 1821   10/20/18 1818  Blood culture (routine x 2)  BLOOD CULTURE X 2,   STAT     10/20/18 1817          Vitals/Pain Today's Vitals   10/20/18 2230 10/20/18 2245 10/20/18 2300 10/20/18 2315  BP: (!) 180/75 (!) 196/104 (!) 176/78 (!) 181/91  Pulse: (!) 49 74 (!) 46 (!) 53  Resp: 19 17 15 19   Temp:      TempSrc:      SpO2: 98% 97% 96% 95%  Weight:      Height:      PainSc:        Isolation Precautions Droplet precaution  Medications Medications  albuterol (PROVENTIL) (2.5 MG/3ML) 0.083% nebulizer solution 3 mL (has no administration in time range)  dextromethorphan-guaiFENesin (MUCINEX DM) 30-600 MG per 12 hr tablet 1 tablet (has no administration in time range)  cefTRIAXone (ROCEPHIN) 1 g in sodium chloride 0.9 % 100 mL IVPB (has no administration in time range)  calcium carbonate (TUMS - dosed in mg elemental calcium) chewable tablet 400 mg of elemental calcium (has no administration in time range)  tamsulosin (FLOMAX) capsule 0.8 mg (has no administration in time range)  hydrALAZINE (APRESOLINE) injection 5 mg (has no administration in time range)  amLODipine (NORVASC) tablet 5 mg (has no administration in time range)  acetaminophen (TYLENOL) tablet 650 mg (has no administration in time range)    Or  acetaminophen (TYLENOL) suppository 650 mg (has no administration in time range)  ondansetron (ZOFRAN) tablet 4 mg (has no administration in time range)    Or  ondansetron (ZOFRAN) injection 4 mg (has no administration in time range)  furosemide (LASIX) injection 40 mg (has no administration in time range)  hydrALAZINE (APRESOLINE) injection 5 mg (5 mg Intravenous Given 10/20/18 2141)    Mobility walks with device Low fall risk   Focused Assessments Cardiac Assessment Handoff:    Lab Results  Component Value Date   TROPONINI <0.03 10/20/2018   No results found for: DDIMER Does the Patient currently have chest  pain? No     R Recommendations: See Admitting Provider Note  Report given to:   Additional Notes: Pt has no medical complaints, wants to go home but understands need to stay.  "Old stroke" on the left w/ deficits.

## 2018-10-20 NOTE — ED Notes (Addendum)
ED Provider at bedside after RN updated provider of vitals. Heart rate drops to 30's and blood pressure is high 211/85.

## 2018-10-21 ENCOUNTER — Observation Stay (HOSPITAL_COMMUNITY): Payer: Medicare Other

## 2018-10-21 DIAGNOSIS — Z888 Allergy status to other drugs, medicaments and biological substances status: Secondary | ICD-10-CM | POA: Diagnosis not present

## 2018-10-21 DIAGNOSIS — N183 Chronic kidney disease, stage 3 (moderate): Secondary | ICD-10-CM | POA: Diagnosis present

## 2018-10-21 DIAGNOSIS — Z20828 Contact with and (suspected) exposure to other viral communicable diseases: Secondary | ICD-10-CM | POA: Diagnosis present

## 2018-10-21 DIAGNOSIS — Z972 Presence of dental prosthetic device (complete) (partial): Secondary | ICD-10-CM | POA: Diagnosis not present

## 2018-10-21 DIAGNOSIS — R338 Other retention of urine: Secondary | ICD-10-CM | POA: Diagnosis present

## 2018-10-21 DIAGNOSIS — I160 Hypertensive urgency: Secondary | ICD-10-CM | POA: Diagnosis not present

## 2018-10-21 DIAGNOSIS — M1711 Unilateral primary osteoarthritis, right knee: Secondary | ICD-10-CM | POA: Diagnosis present

## 2018-10-21 DIAGNOSIS — N179 Acute kidney failure, unspecified: Secondary | ICD-10-CM | POA: Diagnosis present

## 2018-10-21 DIAGNOSIS — I130 Hypertensive heart and chronic kidney disease with heart failure and stage 1 through stage 4 chronic kidney disease, or unspecified chronic kidney disease: Secondary | ICD-10-CM | POA: Diagnosis present

## 2018-10-21 DIAGNOSIS — I69354 Hemiplegia and hemiparesis following cerebral infarction affecting left non-dominant side: Secondary | ICD-10-CM | POA: Diagnosis not present

## 2018-10-21 DIAGNOSIS — N401 Enlarged prostate with lower urinary tract symptoms: Secondary | ICD-10-CM | POA: Diagnosis present

## 2018-10-21 DIAGNOSIS — Z79899 Other long term (current) drug therapy: Secondary | ICD-10-CM | POA: Diagnosis not present

## 2018-10-21 DIAGNOSIS — I161 Hypertensive emergency: Secondary | ICD-10-CM

## 2018-10-21 DIAGNOSIS — E860 Dehydration: Secondary | ICD-10-CM | POA: Diagnosis present

## 2018-10-21 DIAGNOSIS — Z823 Family history of stroke: Secondary | ICD-10-CM | POA: Diagnosis not present

## 2018-10-21 DIAGNOSIS — Z87891 Personal history of nicotine dependence: Secondary | ICD-10-CM | POA: Diagnosis not present

## 2018-10-21 DIAGNOSIS — G20 Parkinson's disease: Secondary | ICD-10-CM | POA: Diagnosis present

## 2018-10-21 DIAGNOSIS — I480 Paroxysmal atrial fibrillation: Secondary | ICD-10-CM | POA: Diagnosis present

## 2018-10-21 DIAGNOSIS — R001 Bradycardia, unspecified: Secondary | ICD-10-CM | POA: Diagnosis present

## 2018-10-21 DIAGNOSIS — I5033 Acute on chronic diastolic (congestive) heart failure: Secondary | ICD-10-CM | POA: Diagnosis present

## 2018-10-21 DIAGNOSIS — E785 Hyperlipidemia, unspecified: Secondary | ICD-10-CM | POA: Diagnosis present

## 2018-10-21 DIAGNOSIS — R1311 Dysphagia, oral phase: Secondary | ICD-10-CM | POA: Diagnosis present

## 2018-10-21 DIAGNOSIS — N390 Urinary tract infection, site not specified: Secondary | ICD-10-CM | POA: Diagnosis present

## 2018-10-21 DIAGNOSIS — E876 Hypokalemia: Secondary | ICD-10-CM | POA: Diagnosis present

## 2018-10-21 LAB — RESPIRATORY PANEL BY PCR

## 2018-10-21 LAB — ECHOCARDIOGRAM COMPLETE
Height: 67 in
Weight: 2649.05 oz

## 2018-10-21 LAB — EXPECTORATED SPUTUM ASSESSMENT W GRAM STAIN, RFLX TO RESP C

## 2018-10-21 LAB — CBC
HCT: 45.4 % (ref 39.0–52.0)
Hemoglobin: 15.5 g/dL (ref 13.0–17.0)
MCH: 31.3 pg (ref 26.0–34.0)
MCHC: 34.1 g/dL (ref 30.0–36.0)
MCV: 91.5 fL (ref 80.0–100.0)
Platelets: 215 10*3/uL (ref 150–400)
RBC: 4.96 MIL/uL (ref 4.22–5.81)
RDW: 13.1 % (ref 11.5–15.5)
WBC: 19.6 10*3/uL — ABNORMAL HIGH (ref 4.0–10.5)
nRBC: 0 % (ref 0.0–0.2)

## 2018-10-21 LAB — BASIC METABOLIC PANEL
Anion gap: 14 (ref 5–15)
BUN: 21 mg/dL (ref 8–23)
CO2: 23 mmol/L (ref 22–32)
Calcium: 9.6 mg/dL (ref 8.9–10.3)
Chloride: 103 mmol/L (ref 98–111)
Creatinine, Ser: 1.73 mg/dL — ABNORMAL HIGH (ref 0.61–1.24)
GFR calc Af Amer: 41 mL/min — ABNORMAL LOW (ref >60)
GFR calc non Af Amer: 35 mL/min — ABNORMAL LOW (ref >60)
Glucose, Bld: 147 mg/dL — ABNORMAL HIGH (ref 70–99)
Potassium: 3.3 mmol/L — ABNORMAL LOW (ref 3.5–5.1)
Sodium: 140 mmol/L (ref 135–145)

## 2018-10-21 LAB — INFLUENZA PANEL BY PCR (TYPE A & B)
Influenza A By PCR: NEGATIVE
Influenza B By PCR: NEGATIVE

## 2018-10-21 MED ORDER — AMLODIPINE BESYLATE 2.5 MG PO TABS
5.0000 mg | ORAL_TABLET | Freq: Once | ORAL | Status: AC
  Administered 2018-10-21: 5 mg via ORAL
  Filled 2018-10-21: qty 2

## 2018-10-21 MED ORDER — POTASSIUM CHLORIDE CRYS ER 20 MEQ PO TBCR
40.0000 meq | EXTENDED_RELEASE_TABLET | Freq: Once | ORAL | Status: AC
  Administered 2018-10-21: 07:00:00 40 meq via ORAL
  Filled 2018-10-21: qty 2

## 2018-10-21 MED ORDER — AMLODIPINE BESYLATE 10 MG PO TABS
10.0000 mg | ORAL_TABLET | Freq: Every day | ORAL | Status: DC
  Administered 2018-10-22 – 2018-10-26 (×5): 10 mg via ORAL
  Filled 2018-10-21 (×5): qty 1

## 2018-10-21 MED ORDER — ENSURE ENLIVE PO LIQD
237.0000 mL | Freq: Two times a day (BID) | ORAL | Status: DC
  Administered 2018-10-21: 19:00:00 237 mL via ORAL

## 2018-10-21 NOTE — Progress Notes (Signed)
PROGRESS NOTE  [name] WGN:[id number] DOB: 02/14/1931 DOA: 10/20/2018 PCP: Gordan Payment., MD  HPI/Recap of past 24 hours: [name] is a 85 y.o. male with medical history significant of hypertension, hyperlipidemia, stroke with mild left-sided weakness, BPH, PAF not on anticoagulants, Parkinson's disease, pontine hemorrhage 2018, CKD-3, who presents with cough, SOB and leg edema from home.  On presentation to the ED UA positive for pyuria and severely hypertensive.  Admitted for UTI and hypertensive emergency.  10/21/18: Patient seen and examined his bedside.  Reports his appetite is poor.  He is alert and oriented x3.  Minimally contributing to history.   Assessment/Plan: Principal Problem:   Hypertensive urgency Active Problems:   PAF (paroxysmal atrial fibrillation) (HCC)   History of CVA (cerebrovascular accident)   CKD (chronic kidney disease), stage III (HCC)   UTI (urinary tract infection)   Cough   Complicated UTI in the setting of BPH Presented with positive urine analysis Worsening leukocytosis noted this morning with WBC 19 K Continue Rocephin Urine culture and blood cultures in process Good urine output net negative 2.5 L since admission  Hypertensive emergency Presented with blood pressure 221/127 and AKI Blood pressure improving Started amlodipine 10 mg daily Also received IV Lasix 40 mg once Continue to closely monitor vital signs  Worsening AKI on CKD 3 Baseline creatinine appears to be 1.3 with GFR 45 Presented with creatinine of 1.69 and GFR of 36 Creatinine today 1.73 Urine output net -2.5 L since admission Continue to monitor urine output  Hypokalemia Potassium 3.3 Replete as needed Repeat BMP in the morning  BPH Continue tamsulosin  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 fib not on oral anticoagulation due to history of pontine hemorrhage Bradycardic on presentation Bradycardia  has now resolved after holding her beta-blocker We will restart beta-blocker at lower dose  Risks: Patient will require at least 2 midnights for further treatment of present condition due to worsening AKI, worsening leukocytosis in the setting of complicated UTI.    DVT ppx: SCD Code Status: Full code Family Communication: We will call to update. Disposition Plan:  Anticipate discharge back to previous home environment in 1 to 2 days Consults called:  none    Objective: Vitals:   10/21/18 0206 10/21/18 0450 10/21/18 0921 10/21/18 1216  BP: (!) 172/92 (!) 167/82 (!) 183/93 (!) 187/99  Pulse:  80 88 80  Resp:  Temp:  98 F (36.7 C) 98.1 F (36.7 C) 98.3 F (36.8 C)  TempSrc:  Oral Oral Oral  SpO2:  92% 93% 94%  Weight:      Height:        Intake/Output Summary (Last 24 hours) at 10/21/2018 1234 Last data filed at 10/21/2018 1219 Gross per 24 hour  Intake -  Output 2590 ml  Net -2590 ml   Filed Weights   10/20/18 1737 10/21/18 0100  Weight: 77.1 kg 75.1 kg    Exam:  . General: 85 y.o. year-old male well developed well nourished in no acute distress.  Alert and oriented x3. . Cardiovascular: Regular rate and rhythm with no rubs or gallops.  No thyromegaly or JVD noted.   Marland Kitchen Respiratory: Mild rales at bases with no wheezes. Good inspiratory effort. . Abdomen: Soft nontender nondistended with normal bowel sounds x4 quadrants. . Musculoskeletal: Trace lower extremity edema. 2/4 pulses in all 4 extremities. Marland Kitchen Psychiatry: Mood is appropriate for condition and setting   Data  Reviewed: CBC: Recent Labs  Lab 10/20/18 1820 10/21/18 0444  WBC 8.5 19.6*  NEUTROABS 5.8  --   HGB 13.5 15.5  HCT 41.6 45.4  MCV 95.6 91.5  PLT 196 215   Basic Metabolic Panel: Recent Labs  Lab 10/20/18 1820 10/21/18 0444  NA 138 140  K 4.2 3.3*  CL 105 103  CO2 24 23  GLUCOSE 95 147*  BUN 22 21  CREATININE 1.69* 1.73*  CALCIUM 9.5 9.6   GFR: Estimated Creatinine  Clearance: 29.2 mL/min (A) (by C-G formula based on SCr of 1.73 mg/dL (H)). Liver Function Tests: Recent Labs  Lab 10/20/18 1820  AST 34  ALT 14  ALKPHOS 56  BILITOT 1.0  PROT 7.2  ALBUMIN 3.8   No results for input(s): LIPASE, AMYLASE in the last 168 hours. No results for input(s): AMMONIA in the last 168 hours. Coagulation Profile: No results for input(s): INR, PROTIME in the last 168 hours. Cardiac Enzymes: Recent Labs  Lab 10/20/18 1820  TROPONINI <0.03   BNP (last 3 results) No results for input(s): PROBNP in the last 8760 hours. HbA1C: No results for input(s): HGBA1C in the last 72 hours. CBG: No results for input(s): GLUCAP in the last 168 hours. Lipid Profile: No results for input(s): CHOL, HDL, LDLCALC, TRIG, CHOLHDL, LDLDIRECT in the last 72 hours. Thyroid Function Tests: No results for input(s): TSH, T4TOTAL, FREET4, T3FREE, THYROIDAB in the last 72 hours. Anemia Panel: No results for input(s): VITAMINB12, FOLATE, FERRITIN, TIBC, IRON, RETICCTPCT in the last 72 hours. Urine analysis:    Component Value Date/Time   COLORURINE YELLOW 10/20/2018 1900   APPEARANCEUR CLOUDY (A) 10/20/2018 1900   LABSPEC 1.005 10/20/2018 1900   PHURINE 6.0 10/20/2018 1900   GLUCOSEU NEGATIVE 10/20/2018 1900   HGBUR SMALL (A) 10/20/2018 1900   BILIRUBINUR NEGATIVE 10/20/2018 1900   KETONESUR NEGATIVE 10/20/2018 1900   PROTEINUR NEGATIVE 10/20/2018 1900   NITRITE NEGATIVE 10/20/2018 1900   LEUKOCYTESUR LARGE (A) 10/20/2018 1900   Sepsis Labs: @LABRCNTIP (procalcitonin:4,lacticidven:4)  ) Recent Results (from the past 240 hour(s))  SARS Coronavirus 2 (CEPHEID- Performed in [facility] lab), Hosp Order     Status: None   Collection Time: 10/20/18  6:34 PM  Result Value Ref Range Status   SARS Coronavirus 2 NEGATIVE NEGATIVE Final    Comment: (NOTE) If result is NEGATIVE SARS-CoV-2 target nucleic acids are NOT DETECTED. The SARS-CoV-2 RNA is generally detectable in  upper and lower  respiratory specimens during the acute phase of infection. The lowest  concentration of SARS-CoV-2 viral copies this assay can detect is 250  copies / mL. A negative result does not preclude SARS-CoV-2 infection  and should not be used as the sole basis for treatment or other  patient management decisions.  A negative result may occur with  improper specimen collection / handling, submission of specimen other  than nasopharyngeal swab, presence of viral mutation(s) within the  areas targeted by this assay, and inadequate number of viral copies  (<250 copies / mL). A negative result must be combined with clinical  observations, patient history, and epidemiological information. If result is POSITIVE SARS-CoV-2 target nucleic acids are DETECTED. The SARS-CoV-2 RNA is generally detectable in upper and lower  respiratory specimens dur ing the acute phase of infection.  Positive  results are indicative of active infection with SARS-CoV-2.  Clinical  correlation with patient history and other diagnostic information is  necessary to determine patient infection status.  Positive results do  not rule out bacterial infection or co-infection with other viruses. If result is PRESUMPTIVE POSTIVE SARS-CoV-2 nucleic acids MAY BE PRESENT.   A presumptive positive result was obtained on the submitted specimen  and confirmed on repeat testing.  While 2019 novel coronavirus  (SARS-CoV-2) nucleic acids may be present in the submitted sample  additional confirmatory testing may be necessary for epidemiological  and / or clinical management purposes  to differentiate between  SARS-CoV-2 and other Sarbecovirus currently known to infect humans.  If clinically indicated additional testing with an alternate test  methodology [phone]) is advised. The SARS-CoV-2 RNA is generally  detectable in upper and lower respiratory sp ecimens during the acute  phase of infection. The expected result is  Negative. Fact Sheet for Patients:  BoilerBrush.com.cy Fact Sheet for Healthcare Providers: https://pope.com/ This test is not yet approved or cleared by the [place] FDA and has been authorized for detection and/or diagnosis of SARS-CoV-2 by FDA under an Emergency Use Authorization (EUA).  This EUA will remain in effect (meaning this test can be used) for the duration of the COVID-19 declaration under Section 564(b)(1) of the Act, 21 U.S.C. section 360bbb-3(b)(1), unless the authorization is terminated or revoked sooner. Performed at [facility], [address] [place], [place] [address]   Respiratory Panel by PCR     Status: None   Collection Time: 10/21/18  3:10 AM  Result Value Ref Range Status   Adenovirus NOT DETECTED NOT DETECTED Final   Coronavirus 229E NOT DETECTED NOT DETECTED Final    Comment: (NOTE) The Coronavirus on the Respiratory Panel, DOES NOT test for the novel  Coronavirus (2019 nCoV)    Coronavirus HKU1 NOT DETECTED NOT DETECTED Final   Coronavirus NL63 NOT DETECTED NOT DETECTED Final   Coronavirus OC43 NOT DETECTED NOT DETECTED Final   Metapneumovirus NOT DETECTED NOT DETECTED Final   Rhinovirus / Enterovirus NOT DETECTED NOT DETECTED Final   Influenza B NOT DETECTED NOT DETECTED Final   Parainfluenza Virus 1 NOT DETECTED NOT DETECTED Final   Parainfluenza Virus 2 NOT DETECTED NOT DETECTED Final   Parainfluenza Virus 3 NOT DETECTED NOT DETECTED Final   Parainfluenza Virus 4 NOT DETECTED NOT DETECTED Final   Respiratory Syncytial Virus NOT DETECTED NOT DETECTED Final   Bordetella pertussis NOT DETECTED NOT DETECTED Final   Chlamydophila pneumoniae NOT DETECTED NOT DETECTED Final   Mycoplasma pneumoniae NOT DETECTED NOT DETECTED Final    Comment: Performed at [facility], [address], [place], [place] [address]      Studies: Dg Chest 2 View  Result Date: 10/20/2018 CLINICAL DATA:   Productive cough for 2 weeks. EXAM: CHEST - 2 VIEW COMPARISON:  PA and lateral chest 10/20/2018, 08/13/2016 and 09/05/2015. Single-view of the chest 01/20/2017. FINDINGS: There is cardiomegaly. Mild prominence of the pulmonary interstitium is somewhat more extensive on the right. No consolidative process, pneumothorax or effusion. Aortic atherosclerosis is noted. No acute or focal bony abnormality. IMPRESSION: Cardiomegaly and mild interstitial prominence likely due to edema. The appearance is unchanged since the examination earlier today. Electronically Signed   By: Drusilla Kanner M.D.   On: 10/20/2018 18:55    Scheduled Meds: . [START ON 10/22/2018] amLODipine  10 mg Oral Daily  . amLODipine  5 mg Oral Once  . calcium carbonate  2 tablet Oral TID PC  . tamsulosin  0.8 mg Oral QPC supper    Continuous Infusions: . cefTRIAXone (ROCEPHIN)  IV 1 g (10/21/18 0018)  LOS: 0 days     Darlin Drop, MD [facility] Pager [phone]  If 7PM-7AM, please contact night-coverage [email or web address] Password [facility] 10/21/2018, 12:34 PM

## 2018-10-21 NOTE — Progress Notes (Signed)
Patient blood pressure elevated gave PRN hydralazine and oral amlodipine was increased gave extra dose,

## 2018-10-21 NOTE — Progress Notes (Signed)
Pt received from ED to 3E25. Initial assessment complete. VSS. Tele applied. CCMD notified. Pt oriented to room and call bell system. Will continue to monitor.

## 2018-10-21 NOTE — Progress Notes (Signed)
  Echocardiogram 2D Echocardiogram has been performed.  [name] 10/21/2018, 12:02 PM

## 2018-10-22 LAB — CBC
HCT: 45.6 % (ref 39.0–52.0)
Hemoglobin: 15.8 g/dL (ref 13.0–17.0)
MCH: 31.7 pg (ref 26.0–34.0)
MCHC: 34.6 g/dL (ref 30.0–36.0)
MCV: 91.6 fL (ref 80.0–100.0)
Platelets: 218 10*3/uL (ref 150–400)
RBC: 4.98 MIL/uL (ref 4.22–5.81)
RDW: 13.7 % (ref 11.5–15.5)
WBC: 24.4 10*3/uL — ABNORMAL HIGH (ref 4.0–10.5)
nRBC: 0 % (ref 0.0–0.2)

## 2018-10-22 LAB — BASIC METABOLIC PANEL
Anion gap: 13 (ref 5–15)
BUN: 31 mg/dL — ABNORMAL HIGH (ref 8–23)
CO2: 23 mmol/L (ref 22–32)
Calcium: 10.6 mg/dL — ABNORMAL HIGH (ref 8.9–10.3)
Chloride: 105 mmol/L (ref 98–111)
Creatinine, Ser: 2.51 mg/dL — ABNORMAL HIGH (ref 0.61–1.24)
GFR calc Af Amer: 26 mL/min — ABNORMAL LOW (ref >60)
GFR calc non Af Amer: 22 mL/min — ABNORMAL LOW (ref >60)
Glucose, Bld: 146 mg/dL — ABNORMAL HIGH (ref 70–99)
Potassium: 4.8 mmol/L (ref 3.5–5.1)
Sodium: 141 mmol/L (ref 135–145)

## 2018-10-22 LAB — MAGNESIUM: Magnesium: 2 mg/dL (ref 1.7–2.4)

## 2018-10-22 MED ORDER — BISOPROLOL FUMARATE 5 MG PO TABS: 10.0000 mg | ORAL_TABLET | Freq: Every day | ORAL | Status: DC

## 2018-10-22 MED ORDER — BISOPROLOL FUMARATE 5 MG PO TABS
5.0000 mg | ORAL_TABLET | Freq: Every day | ORAL | Status: DC
  Administered 2018-10-22 – 2018-10-26 (×5): 5 mg via ORAL
  Filled 2018-10-22 (×5): qty 1

## 2018-10-22 MED ORDER — SODIUM CHLORIDE 0.45 % IV SOLN
INTRAVENOUS | Status: AC
  Administered 2018-10-22 – 2018-10-23 (×2): via INTRAVENOUS

## 2018-10-22 MED ORDER — SODIUM CHLORIDE 0.9 % IV SOLN
INTRAVENOUS | Status: DC | PRN
  Administered 2018-10-22 – 2018-10-23 (×2): 250 mL via INTRAVENOUS

## 2018-10-22 MED ORDER — HYDRALAZINE HCL 20 MG/ML IJ SOLN: 10.0000 mg | Freq: Four times a day (QID) | INTRAMUSCULAR | Status: DC | PRN

## 2018-10-22 MED ORDER — ENSURE ENLIVE PO LIQD
237.0000 mL | Freq: Three times a day (TID) | ORAL | Status: DC
  Administered 2018-10-22 – 2018-10-25 (×9): 237 mL via ORAL

## 2018-10-22 MED ORDER — HEPARIN SODIUM (PORCINE) 5000 UNIT/ML IJ SOLN
5000.0000 [IU] | Freq: Three times a day (TID) | INTRAMUSCULAR | Status: DC
  Administered 2018-10-22 – 2018-10-24 (×6): 5000 [IU] via SUBCUTANEOUS
  Filled 2018-10-22 (×6): qty 1

## 2018-10-22 MED ORDER — ADULT MULTIVITAMIN W/MINERALS CH
1.0000 | ORAL_TABLET | Freq: Every day | ORAL | Status: DC
  Administered 2018-10-23 – 2018-10-26 (×4): 1 via ORAL
  Filled 2018-10-22 (×4): qty 1

## 2018-10-22 NOTE — Progress Notes (Signed)
Spoke with son [name] that was consider about his father not eating at home, stated that dad was walking at home without device without falls, that he declined quickly. Explained to the treatment and told him that we where waiting on blood cultures and antibiotic treatment.

## 2018-10-22 NOTE — Progress Notes (Signed)
Initial Nutrition Assessment  DOCUMENTATION CODES:   Not applicable  INTERVENTION:   -Ensure Enlive po TID, each supplement provides 350 kcal and 20 grams of protein -Magic Cup TID with meals, each supplement provides 290 kcals and 9 grams protein -MVI with minerals daily  NUTRITION DIAGNOSIS:   Inadequate oral intake related to decreased appetite as evidenced by meal completion < 25%.  GOAL:   Patient will meet greater than or equal to 90% of their needs  MONITOR:   PO intake, Supplement acceptance, Labs, Weight trends, Skin, I & O's  REASON FOR ASSESSMENT:   Malnutrition Screening Tool    ASSESSMENT:   Sam Hardester is a 85 y.o. male with medical history significant of hypertension, hyperlipidemia, stroke with mild left-sided weakness, BPH, PAF not on anticoagulants, Parkinson's disease, pontine hemorrhage 2018, CKD-3, who presents with cough, SOB and leg edema.  Pt admitted with hypertensive urgency.   Reviewed I/O's: +1.7 L x 24 hours and -360 ml since admission  UOP: 800 ml x 24 hours  Reviewed wt hx; no recent hx records to assess acute wt changes, however, pt with history of mild distant wt loss.   Pt sleeping soundly at time of visit. He did not arouse to voice or touch. RD observed pt's meal tray- pt consumed only a few bites of pancakes, 50% of coffee, and all of Ensure supplement.   Pt at risk for malnutrition due to advanced age and poor po intake, however, unable to identity at this time. Pt would benefit from addition of nutrition supplements.   Labs reviewed: K: 3.3.   NUTRITION - FOCUSED PHYSICAL EXAM:    Most Recent Value  Orbital Region  Mild depletion  Upper Arm Region  No depletion  Thoracic and Lumbar Region  No depletion  Buccal Region  No depletion  Temple Region  Mild depletion  Clavicle Bone Region  No depletion  Clavicle and Acromion Bone Region  No depletion  Scapular Bone Region  No depletion  Dorsal Hand  No depletion  Patellar  Region  No depletion  Anterior Thigh Region  No depletion  Posterior Calf Region  No depletion  Edema (RD Assessment)  Mild  Hair  Reviewed  Eyes  Reviewed  Mouth  Reviewed  Skin  Reviewed  Nails  Reviewed       Diet Order:   Diet Order            Diet 2 gram sodium Room service appropriate? Yes; Fluid consistency: Thin  Diet effective now              EDUCATION NEEDS:   No education needs have been identified at this time  Skin:  Skin Assessment: Reviewed RN Assessment  Last BM:  Unknown  Height:   Ht Readings from Last 1 Encounters:  10/21/18 5\' 7"  (1.702 m)    Weight:   Wt Readings from Last 1 Encounters:  10/22/18 76.5 kg    Ideal Body Weight:  67.3 kg  BMI:  Body mass index is 26.41 kg/m.  Estimated Nutritional Needs:   Kcal:  1700-1900  Protein:  90-105 grams  Fluid:  1.7-1.9 L    Di Jasmer A. Mayford Knife, RD, LDN, CDCES Registered Dietitian II Certified Diabetes Care and Education Specialist Pager: [phone] After hours Pager: [phone]

## 2018-10-22 NOTE — Progress Notes (Signed)
Patient is lethargic with increased temp 99.3 flushed in face, decrease appetite and urine output paged MD to make award of acute changes.

## 2018-10-22 NOTE — Progress Notes (Signed)
Noted elevated B/P at 177/84 at 0131. Per prn parameters to give hydralazine  5 mg if systolic is above 175 systolic. Follow up B/P resulted 166/81. Day shift nurse made aware.

## 2018-10-22 NOTE — Evaluation (Signed)
Physical Therapy Evaluation Patient Details Name: [name] MRN: [id number] DOB: 1930/11/15 Today's Date: 10/22/2018   History of Present Illness  [name] is a 85 y.o. male with medical history significant of hypertension, hyperlipidemia, stroke with mild left-sided weakness, BPH, PAF not on anticoagulants, Parkinson's disease, pontine hemorrhage 2018, CKD-3, who presents with cough, SOB and leg edema from home.  On presentation to the ED UA positive for pyuria and severely hypertensive.  Admitted for UTI and hypertensive emergency.  Clinical Impression  Pt admitted with above diagnosis. Pt currently with functional limitations due to the deficits listed below (see PT Problem List). Limited evaluation as pt lethargic and difficult to arouse.  He answered some questions but was too weak to assist PT enough to sit EOB.  Pt was positioned better in bed prior to departure.  Pt did also perform some LE exercises.  Pts family states that they assist pt x 24 hours.  Will update d/c plan once pt can do more.   Pt will benefit from skilled PT to increase their independence and safety with mobility to allow discharge to the venue listed below.      Follow Up Recommendations Home health PT;Supervision/Assistance - 24 hour    Equipment Recommendations  None recommended by PT    Recommendations for Other Services       Precautions / Restrictions Precautions Precautions: Fall Restrictions Weight Bearing Restrictions: No      Mobility  Bed Mobility Overal bed mobility: Needs Assistance Bed Mobility: Supine to Sit     Supine to sit: Max assist;+2 for physical assistance;HOB elevated     General bed mobility comments: could not come to EOB due to pt not helping and lethargy.   Transfers                 General transfer comment: unable to assess  Ambulation/Gait                Stairs            Wheelchair Mobility    Modified Rankin (Stroke Patients  Only)       Balance Overall balance assessment: Needs assistance;History of Falls     Sitting balance - Comments: unable to assess                                     Pertinent Vitals/Pain Pain Assessment: (unable to assess - pt did not answer)    Home Living Family/patient expects to be discharged to:: Private residence Living Arrangements: Spouse/significant other;Children(lives with spouse and son ) Available Help at Discharge: Family;Available 24 hours/day Type of Home: House Home Access: Stairs to enter Entrance Stairs-Rails: None Entrance Stairs-Number of Steps: 2 Home Layout: Able to live on main level with bedroom/bathroom;Two level(Has a ramp that they can put on house prn) Home Equipment: Walker - 2 wheels;Cane - single point;Wheelchair - manual;Shower seat;Grab bars - tub/shower;Grab bars - toilet      Prior Function Level of Independence: Needs assistance   Gait / Transfers Assistance Needed: wife always walks with pt min assist and pt does not use equipment at all times.  Sometimes he uses RW but they have to hold onto it so he doesn't tip it over per wife.  He also sits in wheelchair in home.  Has a cane he uses too  ADL's / Homemaking Assistance Needed: They assist pt with this  Comments: Family  reports he has had poor balance for some time.      Hand Dominance   Dominant Hand: Left    Extremity/Trunk Assessment   Upper Extremity Assessment Upper Extremity Assessment: Defer to OT evaluation    Lower Extremity Assessment Lower Extremity Assessment: RLE deficits/detail;LLE deficits/detail RLE Deficits / Details: grossly 3-/5 LLE Deficits / Details: grossly 3-/5    Cervical / Trunk Assessment Cervical / Trunk Assessment: Kyphotic  Communication   Communication: Expressive difficulties(dysarthria)  Cognition Arousal/Alertness: Lethargic Behavior During Therapy: Flat affect Overall Cognitive Status: History of cognitive impairments -  at baseline                                        General Comments      Exercises General Exercises - Upper Extremity Shoulder Flexion: AAROM;Both;10 reps;Supine Shoulder Horizontal ADduction: AAROM;Both;10 reps;Supine General Exercises - Lower Extremity Ankle Circles/Pumps: AAROM;Both;10 reps;Supine Heel Slides: AAROM;Both;10 reps;Supine Hip ABduction/ADduction: AAROM;Both;10 reps;Supine   Assessment/Plan    PT Assessment Patient needs continued PT services  PT Problem List Decreased mobility;Decreased balance;Decreased activity tolerance;Decreased strength;Decreased range of motion;Decreased knowledge of use of DME;Decreased safety awareness;Decreased knowledge of precautions       PT Treatment Interventions DME instruction;Gait training;Functional mobility training;Therapeutic activities;Balance training;Therapeutic exercise;Patient/family education;Wheelchair mobility training    PT Goals (Current goals can be found in the Care Plan section)  Acute Rehab PT Goals Patient Stated Goal: to go home PT Goal Formulation: With patient/family Time For Goal Achievement: 11/05/18 Potential to Achieve Goals: Good    Frequency Min 3X/week   Barriers to discharge        Co-evaluation               AM-PAC PT "6 Clicks" Mobility  Outcome Measure Help needed turning from your back to your side while in a flat bed without using bedrails?: A Lot Help needed moving from lying on your back to sitting on the side of a flat bed without using bedrails?: Total Help needed moving to and from a bed to a chair (including a wheelchair)?: Total Help needed standing up from a chair using your arms (e.g., wheelchair or bedside chair)?: Total Help needed to walk in hospital room?: Total Help needed climbing 3-5 steps with a railing? : Total 6 Click Score: 7    End of Session   Activity Tolerance: Patient limited by fatigue Patient left: in bed;with call bell/phone  within reach;with bed alarm set Nurse Communication: Mobility status;Need for lift equipment PT Visit Diagnosis: Muscle weakness (generalized) (M62.81)    Time: 4628-6381 PT Time Calculation (min) (ACUTE ONLY): 13 min   Charges:   PT Evaluation $PT Eval Moderate Complexity: 1 Mod          Yuritzy Zehring,PT Acute Rehabilitation Services Pager:  [phone]  Office:  [phone]    Berline Lopes 10/22/2018, 12:29 PM

## 2018-10-22 NOTE — Progress Notes (Signed)
PROGRESS NOTE  [name] TDH:[id number] DOB: [birth year] DOA: 10/20/2018 PCP: Gordan Payment., MD  HPI/Recap of past 24 hours: [name] is a 85 y.o. male with medical history significant of hypertension, hyperlipidemia, stroke with mild left-sided weakness, BPH, PAF not on anticoagulants, Parkinson's disease, pontine hemorrhage 2018, CKD-3, who presents with cough, SOB and leg edema from home.  On presentation to the ED UA positive for pyuria and severely hypertensive.  Admitted for UTI and hypertensive emergency.  10/22/18: Patient seen and examined at bedside.  He is somnolent but answering questions appropriately.  He has no new complaints.  He denies chest pain, abdominal pain, dyspnea or palpitations.    Assessment/Plan: Principal Problem:   Hypertensive urgency Active Problems:   PAF (paroxysmal atrial fibrillation) (HCC)   History of CVA (cerebrovascular accident)   CKD (chronic kidney disease), stage III (HCC)   UTI (urinary tract infection)   Cough   Complicated UTI in the setting of BPH Presented with positive urine analysis Worsening leukocytosis noted this morning with WBC 24K from 19 K Continue Rocephin Urine culture grew greater than 100,000 colonies of East coli; culture reintubated for better growth  Worsening AKI on CKD 3 Baseline creatinine appears to be 1.3 with GFR 45 Presented with creatinine of 1.69 and GFR of 36 Creatinine today 2.5 from 1.73 Closely monitor urine output Foley cath placed in due to acute urinary retention on 10/22/2018 Start gentle IV fluid hydration half-normal saline at 50 cc/h  Acute urinary retention Bladder scan revealed 500 cc urine Foley catheter placed Continue to closely monitor urine output  Hypertensive emergency Presented with blood pressure 221/127 and AKI Blood pressure improving Started amlodipine 10 mg daily Also received IV Lasix 40 mg once Continue to closely monitor vital signs  Resolved hypokalemia  post repletion   BPH Continue tamsulosin  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 fib not on oral anticoagulation due to history of pontine hemorrhage Bradycardic on presentation Bradycardia has now resolved after holding beta-blocker We will restart beta-blocker at lower dose  Risks: Patient will require at least 2 midnights for further treatment of present condition due to worsening AKI, worsening leukocytosis in the setting of complicated UTI.    DVT ppx: Subcu heparin 3 times daily Code Status: Full code Family Communication: We will call to update. Disposition Plan:  Anticipate discharge back to previous home environment in 1 to 2 days Consults called:  none    Objective: Vitals:   10/21/18 1956 10/22/18 0131 10/22/18 0436 10/22/18 1003  BP: (!) 170/91 (!) 177/84 (!) 166/81 (!) 146/73  Pulse: 89 87 98 89  Resp: 18 18 18    Temp:  98.6 F (37 C) 99 F (37.2 C) 99.3 F (37.4 C)  TempSrc:  Oral Oral Oral  SpO2: 92% 90% 92% 93%  Weight:  76.5 kg    Height:        Intake/Output Summary (Last 24 hours) at 10/22/2018 1748 Last data filed at 10/22/2018 1540 Gross per 24 hour  Intake 3070 ml  Output 750 ml  Net 2320 ml   Filed Weights   10/20/18 1737 10/21/18 0100 10/22/18 0131  Weight: 77.1 kg 75.1 kg 76.5 kg    Exam:  . General: 85 y.o. year-old male chronically ill-appearing in no acute distress somnolent, easily arousable and answering to questions appropriately. . Cardiovascular: Regular rate and rhythm with no rubs or gallops.  No JVD or thyromegaly . Respiratory: Mild rales  at bases with no wheezes noted.  Poor inspiratory effort. . Abdomen: Soft nontender nondistended with normal bowel sounds x4 quadrants. . Musculoskeletal: Trace lower extremity edema. 2/4 pulses in all 4 extremities. Marland Kitchen Psychiatry: Unable to assess mood due to somnolence.   Data Reviewed: CBC: Recent Labs  Lab 10/20/18 1820 10/21/18  0444 10/22/18 0648  WBC 8.5 19.6* 24.4*  NEUTROABS 5.8  --   --   HGB 13.5 15.5 15.8  HCT 41.6 45.4 45.6  MCV 95.6 91.5 91.6  PLT 196 215 218   Basic Metabolic Panel: Recent Labs  Lab 10/20/18 1820 10/21/18 0444 10/22/18 0648  NA 138 140 141  K 4.2 3.3* 4.8  CL 105 103 105  CO2 GLUCOSE 95 147* 146*  BUN 22 21 31*  CREATININE 1.69* 1.73* 2.51*  CALCIUM 9.5 9.6 10.6*  MG  --   --  2.0   GFR: Estimated Creatinine Clearance: 20.1 mL/min (A) (by C-G formula based on SCr of 2.51 mg/dL (H)). Liver Function Tests: Recent Labs  Lab 10/20/18 1820  AST 34  ALT 14  ALKPHOS 56  BILITOT 1.0  PROT 7.2  ALBUMIN 3.8   No results for input(s): LIPASE, AMYLASE in the last 168 hours. No results for input(s): AMMONIA in the last 168 hours. Coagulation Profile: No results for input(s): INR, PROTIME in the last 168 hours. Cardiac Enzymes: Recent Labs  Lab 10/20/18 1820  TROPONINI <0.03   BNP (last 3 results) No results for input(s): PROBNP in the last 8760 hours. HbA1C: No results for input(s): HGBA1C in the last 72 hours. CBG: No results for input(s): GLUCAP in the last 168 hours. Lipid Profile: No results for input(s): CHOL, HDL, LDLCALC, TRIG, CHOLHDL, LDLDIRECT in the last 72 hours. Thyroid Function Tests: No results for input(s): TSH, T4TOTAL, FREET4, T3FREE, THYROIDAB in the last 72 hours. Anemia Panel: No results for input(s): VITAMINB12, FOLATE, FERRITIN, TIBC, IRON, RETICCTPCT in the last 72 hours. Urine analysis:    Component Value Date/Time   COLORURINE YELLOW 10/20/2018 1900   APPEARANCEUR CLOUDY (A) 10/20/2018 1900   LABSPEC 1.005 10/20/2018 1900   PHURINE 6.0 10/20/2018 1900   GLUCOSEU NEGATIVE 10/20/2018 1900   HGBUR SMALL (A) 10/20/2018 1900   BILIRUBINUR NEGATIVE 10/20/2018 1900   KETONESUR NEGATIVE 10/20/2018 1900   PROTEINUR NEGATIVE 10/20/2018 1900   NITRITE NEGATIVE 10/20/2018 1900   LEUKOCYTESUR LARGE (A) 10/20/2018 1900   Sepsis  Labs: (procalcitonin:4,lacticidven:4)  ) Recent Results (from the past 240 hour(s))  Blood culture (routine x 2)     Status: None (Preliminary result)   Collection Time: 10/20/18  6:33 PM  Result Value Ref Range Status   Specimen Description BLOOD LEFT ANTECUBITAL  Final   Special Requests   Final    BOTTLES DRAWN AEROBIC AND ANAEROBIC Blood Culture results may not be optimal due to an inadequate volume of blood received in culture bottles   Culture   Final    NO GROWTH 2 DAYS Performed at [facility], [address], [place], [place] [address]    Report Status PENDING  Incomplete  Urine culture     Status: Abnormal (Preliminary result)   Collection Time: 10/20/18  6:34 PM  Result Value Ref Range Status   Specimen Description URINE, RANDOM  Final   Special Requests NONE  Final   Culture (A)  Final    >=100,000 COLONIES/mL ESCHERICHIA COLI CULTURE REINCUBATED FOR BETTER GROWTH Performed at [facility], [address] [place], [place] [address]    Report Status PENDING  Incomplete  SARS Coronavirus 2 (CEPHEID- Performed in [facility] hospital lab), Hosp Order     Status: None   Collection Time: 10/20/18  6:34 PM  Result Value Ref Range Status   SARS Coronavirus 2 NEGATIVE NEGATIVE Final    Comment: (NOTE) If result is NEGATIVE SARS-CoV-2 target nucleic acids are NOT DETECTED. The SARS-CoV-2 RNA is generally detectable in upper and lower  respiratory specimens during the acute phase of infection. The lowest  concentration of SARS-CoV-2 viral copies this assay can detect is 250  copies / mL. A negative result does not preclude SARS-CoV-2 infection  and should not be used as the sole basis for treatment or other  patient management decisions.  A negative result may occur with  improper specimen collection / handling, submission of specimen other  than nasopharyngeal swab, presence of viral mutation(s) within the  areas targeted by this assay, and  inadequate number of viral copies  (<250 copies / mL). A negative result must be combined with clinical  observations, patient history, and epidemiological information. If result is POSITIVE SARS-CoV-2 target nucleic acids are DETECTED. The SARS-CoV-2 RNA is generally detectable in upper and lower  respiratory specimens dur ing the acute phase of infection.  Positive  results are indicative of active infection with SARS-CoV-2.  Clinical  correlation with patient history and other diagnostic information is  necessary to determine patient infection status.  Positive results do  not rule out bacterial infection or co-infection with other viruses. If result is PRESUMPTIVE POSTIVE SARS-CoV-2 nucleic acids MAY BE PRESENT.   A presumptive positive result was obtained on the submitted specimen  and confirmed on repeat testing.  While 2019 novel coronavirus  (SARS-CoV-2) nucleic acids may be present in the submitted sample  additional confirmatory testing may be necessary for epidemiological  and / or clinical management purposes  to differentiate between  SARS-CoV-2 and other Sarbecovirus currently known to infect humans.  If clinically indicated additional testing with an alternate test  methodology [phone]([id number]) is advised. The SARS-CoV-2 RNA is generally  detectable in upper and lower respiratory sp ecimens during the acute  phase of infection. The expected result is Negative. Fact Sheet for Patients:  BoilerBrush.com.cyhttps://www.fda.gov/media/136312/download Fact Sheet for Healthcare Providers: https://pope.com/https://www.fda.gov/media/136313/download This test is not yet approved or cleared by the [place] FDA and has been authorized for detection and/or diagnosis of SARS-CoV-2 by FDA under an Emergency Use Authorization (EUA).  This EUA will remain in effect (meaning this test can be used) for the duration of the COVID-19 declaration under Section 564(b)(1) of the Act, 21 U.S.C. section 360bbb-3(b)(1), unless the  authorization is terminated or revoked sooner. Performed at [facility], [address] [place], [place] [address]   Blood culture (routine x 2)     Status: None (Preliminary result)   Collection Time: 10/20/18  6:39 PM  Result Value Ref Range Status   Specimen Description BLOOD LEFT HAND  Final   Special Requests   Final    BOTTLES DRAWN AEROBIC AND ANAEROBIC Blood Culture adequate volume   Culture   Final    NO GROWTH 2 DAYS Performed at [facility], [address] [place], [place] [address]    Report Status PENDING  Incomplete  Respiratory Panel by PCR     Status: None   Collection Time: 10/21/18  3:10 AM  Result Value Ref Range Status   Adenovirus NOT DETECTED  NOT DETECTED Final   Coronavirus 229E NOT DETECTED NOT DETECTED Final    Comment: (NOTE) The Coronavirus on the Respiratory Panel, DOES NOT test for the novel  Coronavirus (2019 nCoV)    Coronavirus HKU1 NOT DETECTED NOT DETECTED Final   Coronavirus NL63 NOT DETECTED NOT DETECTED Final   Coronavirus OC43 NOT DETECTED NOT DETECTED Final   Metapneumovirus NOT DETECTED NOT DETECTED Final   Rhinovirus / Enterovirus NOT DETECTED NOT DETECTED Final   Influenza B NOT DETECTED NOT DETECTED Final   Parainfluenza Virus 1 NOT DETECTED NOT DETECTED Final   Parainfluenza Virus 2 NOT DETECTED NOT DETECTED Final   Parainfluenza Virus 3 NOT DETECTED NOT DETECTED Final   Parainfluenza Virus 4 NOT DETECTED NOT DETECTED Final   Respiratory Syncytial Virus NOT DETECTED NOT DETECTED Final   Bordetella pertussis NOT DETECTED NOT DETECTED Final   Chlamydophila pneumoniae NOT DETECTED NOT DETECTED Final   Mycoplasma pneumoniae NOT DETECTED NOT DETECTED Final    Comment: Performed at [facility], [address] [place], [place] [address]  Expectorated sputum assessment w rflx to resp cult     Status: None   Collection Time: 10/21/18 11:03 AM  Result Value Ref Range Status   Specimen Description SPUTUM  Final    Special Requests NONE  Final   Sputum evaluation   Final    THIS SPECIMEN IS ACCEPTABLE FOR SPUTUM CULTURE Performed at [facility], [address] [place], [place] [address]    Report Status 10/21/2018 FINAL  Final  Culture, respiratory     Status: None (Preliminary result)   Collection Time: 10/21/18 11:03 AM  Result Value Ref Range Status   Specimen Description SPUTUM  Final   Special Requests NONE Reflexed from [id number]  Final   Gram Stain   Final    NO WBC SEEN FEW GRAM POSITIVE COCCI IN PAIRS IN CLUSTERS    Culture   Final    CULTURE REINCUBATED FOR BETTER GROWTH Performed at [facility], [address] [place], [place] [address]    Report Status PENDING  Incomplete      Studies: No results found.  Scheduled Meds: . amLODipine  10 mg Oral Daily  . bisoprolol  5 mg Oral Daily  . calcium carbonate  2 tablet Oral TID PC  . feeding supplement (ENSURE ENLIVE)  237 mL Oral TID BM  . multivitamin with minerals  1 tablet Oral Daily  . tamsulosin  0.8 mg Oral QPC supper    Continuous Infusions: . cefTRIAXone (ROCEPHIN)  IV 1 g (10/22/18 0030)     LOS: 1 day     Darlin Drop, MD [facility] Pager [phone]  If 7PM-7AM, please contact night-coverage [email or web address] Password [facility] 10/22/2018, 5:48 PM

## 2018-10-22 NOTE — Progress Notes (Signed)
Foley inserted per retention protocol

## 2018-10-22 NOTE — Progress Notes (Signed)
Patient wife nina came up  to patients room to retreive patients wallet and keys with officer as a witness.

## 2018-10-23 LAB — COMPREHENSIVE METABOLIC PANEL
ALT: 16 U/L (ref 0–44)
AST: 36 U/L (ref 15–41)
Albumin: 3.1 g/dL — ABNORMAL LOW (ref 3.5–5.0)
Alkaline Phosphatase: 60 U/L (ref 38–126)
Anion gap: 12 (ref 5–15)
BUN: 38 mg/dL — ABNORMAL HIGH (ref 8–23)
CO2: 24 mmol/L (ref 22–32)
Calcium: 9.8 mg/dL (ref 8.9–10.3)
Chloride: 105 mmol/L (ref 98–111)
Creatinine, Ser: 2.02 mg/dL — ABNORMAL HIGH (ref 0.61–1.24)
GFR calc Af Amer: 34 mL/min — ABNORMAL LOW (ref >60)
GFR calc non Af Amer: 29 mL/min — ABNORMAL LOW (ref >60)
Glucose, Bld: 132 mg/dL — ABNORMAL HIGH (ref 70–99)
Potassium: 4 mmol/L (ref 3.5–5.1)
Sodium: 141 mmol/L (ref 135–145)
Total Bilirubin: 0.9 mg/dL (ref 0.3–1.2)
Total Protein: 6.7 g/dL (ref 6.5–8.1)

## 2018-10-23 LAB — CULTURE, RESPIRATORY W GRAM STAIN
Culture: NORMAL
Gram Stain: NONE SEEN

## 2018-10-23 LAB — CBC WITH DIFFERENTIAL/PLATELET
Abs Immature Granulocytes: 0.06 10*3/uL (ref 0.00–0.07)
Basophils Absolute: 0.1 10*3/uL (ref 0.0–0.1)
Basophils Relative: 0 %
Eosinophils Absolute: 0.1 10*3/uL (ref 0.0–0.5)
Eosinophils Relative: 1 %
HCT: 44 % (ref 39.0–52.0)
Hemoglobin: 14.7 g/dL (ref 13.0–17.0)
Immature Granulocytes: 0 %
Lymphocytes Relative: 8 %
Lymphs Abs: 1.4 10*3/uL (ref 0.7–4.0)
MCH: 31.5 pg (ref 26.0–34.0)
MCHC: 33.4 g/dL (ref 30.0–36.0)
MCV: 94.2 fL (ref 80.0–100.0)
Monocytes Absolute: 1.3 10*3/uL — ABNORMAL HIGH (ref 0.1–1.0)
Monocytes Relative: 7 %
Neutro Abs: 15.4 10*3/uL — ABNORMAL HIGH (ref 1.7–7.7)
Neutrophils Relative %: 84 %
Platelets: 187 10*3/uL (ref 150–400)
RBC: 4.67 MIL/uL (ref 4.22–5.81)
RDW: 13.8 % (ref 11.5–15.5)
WBC: 18.3 10*3/uL — ABNORMAL HIGH (ref 4.0–10.5)
nRBC: 0 % (ref 0.0–0.2)

## 2018-10-23 LAB — URINE CULTURE: Culture: >=100000 — AB

## 2018-10-23 NOTE — TOC Initial Note (Signed)
Transition of Care [facility](TOC) - Initial/Assessment Note    Patient Details  Name: [name] MRN: [id number] Date of Birth: 05/24/1931  Transition of Care [facility](TOC) CM/SW Contact:    Reola MosherChandler, Gyasi Hazzard L, RN,MHA,BSN Phone Number: [phone] 10/23/2018, 3:01 PM  Clinical Narrative:                 Patient alert and oriented x 2; [facility]; [facility] offered, [facility] chose [facility]; [place] with [facility] called for arrangements; DME - cane and walker at home.  Expected Discharge Plan: Home w Home Health Services Barriers to Discharge: No Barriers Identified   Patient Goals and CMS Choice Patient states their goals for this hospitalization and ongoing recovery are:: to stay at home CMS Medicare.gov Compare Post Acute Care list provided to:: Patient Choice offered to / list presented to : Adult Children  Expected Discharge Plan and Services Expected Discharge Plan: Home w Home Health Services In-house Referral: NA Discharge Planning Services: CM Consult Post Acute Care Choice: NA Living arrangements for the past 2 months: Single Family Home                 DME Arranged: N/A         HH Arranged: RN, PT [facility] Date HH Agency Contacted: 10/23/18 Time HH Agency Contacted: 1500 [facility]: Tiffany RN  Prior Living Arrangements/Services Living arrangements for the past 2 months: Single Family Home Lives with:: Adult Children, Spouse Patient language and need for interpreter reviewed:: Yes Do you feel safe going back to the place where you live?: Yes      Need for Family Participation in Patient Care: Yes (Comment) Care giver support system in place?: Yes (comment)   Criminal Activity/Legal Involvement Pertinent to Current Situation/Hospitalization: No - Comment as needed  Activities of Daily Living Home Assistive Devices/Equipment: Cane (specify quad or straight), Dentures  (specify type) ADL Screening (condition at time of admission) Patient's cognitive ability adequate to safely complete daily activities?: No Is the patient deaf or have difficulty hearing?: No Does the patient have difficulty seeing, even when wearing glasses/contacts?: No Does the patient have difficulty concentrating, remembering, or making decisions?: No Patient able to express need for assistance with ADLs?: No Does the patient have difficulty dressing or bathing?: No Independently performs ADLs?: No Communication: Needs assistance Is this a change from baseline?: Pre-admission baseline Dressing (OT): Needs assistance Is this a change from baseline?: Pre-admission baseline Grooming: Needs assistance Is this a change from baseline?: Pre-admission baseline Feeding: Needs assistance Is this a change from baseline?: Pre-admission baseline Bathing: Needs assistance Is this a change from baseline?: Pre-admission baseline Toileting: Needs assistance Is this a change from baseline?: Pre-admission baseline In/Out Bed: Needs assistance Is this a change from baseline?: Pre-admission baseline Walks in Home: Dependent Is this a change from baseline?: Pre-admission baseline Does the patient have difficulty walking or climbing stairs?: Yes Weakness of Legs: Left Weakness of Arms/Hands: Left  Permission Sought/Granted Permission sought to share information with : Case Manager Permission granted to share information with : Yes, Verbal Permission Granted  Share Information with NAME: son, spouse  Permission granted to share info w AGENCY: HHC agency        Emotional Assessment Appearance:: Developmentally appropriate     Orientation: : Fluctuating Orientation (Suspected and/or reported Sundowners) Alcohol / Substance Use: Not Applicable Psych Involvement: No (comment)  Admission diagnosis:  Hypertensive  emergency [I16.1] Patient Active Problem List   Diagnosis Date Noted  .  Hypertensive urgency 10/20/2018  . CKD (chronic kidney disease), stage III (HCC) 10/20/2018  . UTI (urinary tract infection) 10/20/2018  . Cough 10/20/2018  . Gait disturbance, post-stroke 10/11/2016  . Urinary retention with incomplete bladder emptying 09/03/2016  . E. coli UTI 09/03/2016  . 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 Hyperlipidemia 08/12/2016  . Hypertensive emergency   . Benign essential HTN   . PAF (paroxysmal atrial fibrillation) (HCC)   . History of CVA (cerebrovascular accident)   . Parkinson disease (HCC)   . Primary osteoarthritis of right knee   . Leukocytosis   . AKI (acute kidney injury) (HCC)    PCP:  Gordan Payment., MD Pharmacy:   [facility] [address], [place] - [address] [place] [address] Phone: [phone] Fax: [phone]  [facility] [place], [place] - [address] [place] [address] Phone: [phone] Fax: [phone]     Social Determinants of Health (SDOH) Interventions    Readmission Risk Interventions No flowsheet data found.

## 2018-10-23 NOTE — Progress Notes (Signed)
Physical Therapy Treatment Patient Details Name: [name] MRN: [id number] DOB: 12-Oct-1930 Today's Date: 10/23/2018    History of Present Illness [name] is a 85 y.o. male with medical history significant of hypertension, hyperlipidemia, stroke with mild left-sided weakness, BPH, PAF not on anticoagulants, Parkinson's disease, pontine hemorrhage 2018, CKD-3, who presents with cough, SOB and leg edema from home.  On presentation to the ED UA positive for pyuria and severely hypertensive.  Admitted for UTI and hypertensive emergency.    PT Comments    Pt alert today and willing to participate in therapy today, however is severely limited in safe mobility by decreased strength and balance. Pt is able to initiate movement for bed mobility and transfers but still requires maxAx2. Per prior PT note family is aware of pt weakness and prepared to assist 24/7 with d/c home and will benefit from HHPT. PT will continue to follow acutely.    Follow Up Recommendations  Home health PT;Supervision/Assistance - 24 hour     Equipment Recommendations  None recommended by PT       Precautions / Restrictions Precautions Precautions: Fall Restrictions Weight Bearing Restrictions: No    Mobility  Bed Mobility Overal bed mobility: Needs Assistance Bed Mobility: Supine to Sit     Supine to sit: Max assist;+2 for physical assistance;HOB elevated     General bed mobility comments: pt initiates some movements however requires maxAx2 to come to sitting EoB  Transfers Overall transfer level: Needs assistance   Transfers: Lateral/Scoot Transfers          Lateral/Scoot Transfers: Max assist;+2 physical assistance General transfer comment: maxAx2 for lateral scoot from bed to drop arm recliner, pt initiates LE activation but requires max A to clear hips and scoot to strong R side      Balance Overall balance assessment: Needs assistance;History of Falls Sitting-balance support: Feet  supported;Bilateral upper extremity supported Sitting balance-Leahy Scale: Poor Sitting balance - Comments: progresses from modA to min guard A with balance                                    Cognition Arousal/Alertness: Awake/alert Behavior During Therapy: Flat affect Overall Cognitive Status: History of cognitive impairments - at baseline                                               Pertinent Vitals/Pain Pain Assessment: No/denies pain           PT Goals (current goals can now be found in the care plan section) Acute Rehab PT Goals Patient Stated Goal: to go home PT Goal Formulation: With patient/family Time For Goal Achievement: 11/05/18 Potential to Achieve Goals: Good Progress towards PT goals: Progressing toward goals    Frequency    Min 3X/week      PT Plan Current plan remains appropriate       AM-PAC PT "6 Clicks" Mobility   Outcome Measure  Help needed turning from your back to your side while in a flat bed without using bedrails?: A Lot Help needed moving from lying on your back to sitting on the side of a flat bed without using bedrails?: Total Help needed moving to and from a bed to a chair (including a wheelchair)?: Total Help needed standing up from a chair  using your arms (e.g., wheelchair or bedside chair)?: Total Help needed to walk in hospital room?: Total Help needed climbing 3-5 steps with a railing? : Total 6 Click Score: 7    End of Session Equipment Utilized During Treatment: Gait belt Activity Tolerance: Patient limited by fatigue Patient left: in bed;with call bell/phone within reach;with bed alarm set Nurse Communication: Mobility status;Need for lift equipment PT Visit Diagnosis: Muscle weakness (generalized) (M62.81)     Time: 1610-96040910-0940 PT Time Calculation (min) (ACUTE ONLY): 30 min  Charges:  $Therapeutic Activity: 23-37 mins                     [name] B. Beverely RisenVan Fleet PT, DPT Acute  Rehabilitation Services Pager [phone] Office [phone]    Elon Alaslizabeth B Van Fleet 10/23/2018, 1:50 PM

## 2018-10-23 NOTE — Progress Notes (Signed)
HHC choice offered, son chose [facility]; [place] with [facility] called for arrangements. Alexis Goodell [phone]   [facility]  [phone]   Add [facility] to my Favorites Quality of Patient Care Rating3 out of 5 stars Patient Survey Summary Rating4 out of 5 stars  [facility]  [phone]   Add [facility] to my Favorites Quality of Patient Care Rating3  out of 5 stars Patient Survey Summary Rating4 out of 5 stars  [facility]  [phone]   Add [facility] to my Favorites Quality of Patient Care Rating4 out of 5 stars Patient Survey Summary Rating4 out of 5 stars  [facility]  [phone]   Add [facility] to my Favorites Quality of Patient Care Rating4 out of 5 stars Patient Survey Summary Rating4 out of 5 stars  [facility] [place]  [phone]   Add [facility] [place] to my Favorites Quality of Patient Care Rating4 out of 5 stars Patient Survey Summary Rating4 out of 5 stars  [facility]  [phone]   Add [facility] to my Favorites Quality of Patient Care Rating4 out of 5 stars Patient Survey Summary Rating4 out of 5 stars  [facility]  [phone]   Add [facility] to my Favorites Quality of Patient Care Rating3 out of 5 stars Patient Survey Summary Rating4 out of 5 stars  [facility]  [phone]) [phone]   Add [facility] to my Favorites Quality of Patient Care Rating4 out of 5 stars Not Available  [facility]  [phone]   Add [facility] to my Favorites Quality of Patient Care Rating3 out of 5 stars Patient Survey Summary Rating4 out of 5 stars  [facility]  [phone]   Add [facility] to my Favorites Quality of Patient Care Rating3  out of 5 stars Patient Survey Summary Rating5 out of 5 stars   [facility]  [phone]   Add [facility] to my Favorites Quality of Patient Care Rating3  out of 5 stars Patient Survey Summary Rating4 out of 5 stars  [facility]  [phone]   Add [facility] to my Favorites Quality of Patient Care Rating4 out of 5 stars Patient Survey Summary Rating5 out of 5 stars  [facility]  [phone]

## 2018-10-23 NOTE — Progress Notes (Signed)
PROGRESS NOTE  [name] FOY:[id number] DOB: Oct 10, 1930 DOA: 10/20/2018 PCP: Gordan Payment., MD  HPI/Recap of past 24 hours: [name] is a 85 y.o. male with medical history significant of hypertension, hyperlipidemia, stroke with mild left-sided weakness, BPH, PAF not on anticoagulants, Parkinson's disease, pontine hemorrhage 2018, CKD-3, who presents with cough, SOB and leg edema from home.  On presentation to the ED UA positive for pyuria and severely hypertensive.  Admitted for UTI and hypertensive emergency.  10/23/18: Patient seen and examined at his bedside.  He is somnolent but responsive to voices.  Oriented x2. He denies any pain.  Has no new complaints.  Still weak appearing.  PT assessed and recommended home health PT.    Assessment/Plan: Principal Problem:   Hypertensive urgency Active Problems:   PAF (paroxysmal atrial fibrillation) (HCC)   History of CVA (cerebrovascular accident)   CKD (chronic kidney disease), stage III (HCC)   UTI (urinary tract infection)   Cough   Complicated UTI in the setting of BPH Presented with positive urine analysis Worsening leukocytosis noted this morning with WBC 24K from 19 K Continue Rocephin Urine culture grew greater than 100,000 colonies of East coli; culture reintubated for better growth  Worsening AKI on CKD 3 Baseline creatinine appears to be 1.3 with GFR 45 Presented with creatinine of 1.69 and GFR of 36 Creatinine yesterday 2.5 from 1.73 Creatinine on 10/23/18 is 2.02 Closely monitor urine output Foley cath placed in due to acute urinary retention on 10/22/2018 Continue gentle IV fluid hydration half-normal saline at 50 cc/h  Acute urinary retention Bladder scan revealed 500 cc urine Foley catheter placed Continue to closely monitor urine output  Resolved hypertensive emergency Presented with blood pressure 221/127 and AKI Blood pressure at goal Started amlodipine 10 mg daily Also received IV Lasix 40 mg  once Continue to closely monitor vital signs  Resolved hypokalemia post repletion   BPH Continue tamsulosin  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 fib not on oral anticoagulation due to history of pontine hemorrhage Bradycardic on presentation Bradycardia has now resolved after holding beta-blocker     DVT ppx: Subcu heparin 3 times daily Code Status: Full code Family Communication: We will call to update. Disposition Plan:  Anticipate discharge back to previous home environment in 1 to 2 days Consults called:  none    Objective: Vitals:   10/22/18 1934 10/23/18 0427 10/23/18 0834 10/23/18 1158  BP: 119/70 127/68 140/76 125/72  Pulse: 89 82 77 79  Resp: 18 20  18   Temp: 98.7 F (37.1 C) 98.7 F (37.1 C)  98.2 F (36.8 C)  TempSrc: Oral Oral  Oral  SpO2: 96% 93%  96%  Weight:  73.6 kg    Height:        Intake/Output Summary (Last 24 hours) at 10/23/2018 1346 Last data filed at 10/23/2018 1259 Gross per 24 hour  Intake 1715.56 ml  Output 875 ml  Net 840.56 ml   Filed Weights   10/21/18 0100 10/22/18 0131 10/23/18 0427  Weight: 75.1 kg 76.5 kg 73.6 kg    Exam:  . General: 85 y.o. year-old male chronically ill-appearing in no acute distress.  Somnolent but easily arousable to voices. . Cardiovascular: Regular rate and rhythm with no rubs or gallops.  No JVD or thyromegaly noted.   Marland Kitchen Respiratory: Mild rales at bases with no wheezes noted.  Poor inspiratory effort.   . Abdomen: Soft nontender nondistended with normal  bowel sounds times four quadrants. . Musculoskeletal: Trace lower extremity edema.  2 out of 4 pulses in all 4 extremities.   Marland Kitchen Psychiatry: Unable to assess mood due to somnolence.   Data Reviewed: CBC: Recent Labs  Lab 10/20/18 1820 10/21/18 0444 10/22/18 0648 10/23/18 0850  WBC 8.5 19.6* 24.4* 18.3*  NEUTROABS 5.8  --   --  15.4*  HGB 13.5 15.5 15.8 14.7  HCT 41.6 45.4 45.6 44.0  MCV  95.6 91.5 91.6 94.2  PLT 196 215 218 187   Basic Metabolic Panel: Recent Labs  Lab 10/20/18 1820 10/21/18 0444 10/22/18 0648 10/23/18 0850  NA 138 140 141 141  K 4.2 3.3* 4.8 4.0  CL 105 103 105 105  CO2 GLUCOSE 95 147* 146* 132*  BUN 22 21 31* 38*  CREATININE 1.69* 1.73* 2.51* 2.02*  CALCIUM 9.5 9.6 10.6* 9.8  MG  --   --  2.0  --    GFR: Estimated Creatinine Clearance: 25 mL/min (A) (by C-G formula based on SCr of 2.02 mg/dL (H)). Liver Function Tests: Recent Labs  Lab 10/20/18 1820 10/23/18 0850  AST 34 36  ALT 14 16  ALKPHOS 56 60  BILITOT 1.0 0.9  PROT 7.2 6.7  ALBUMIN 3.8 3.1*   No results for input(s): LIPASE, AMYLASE in the last 168 hours. No results for input(s): AMMONIA in the last 168 hours. Coagulation Profile: No results for input(s): INR, PROTIME in the last 168 hours. Cardiac Enzymes: Recent Labs  Lab 10/20/18 1820  TROPONINI <0.03   BNP (last 3 results) No results for input(s): PROBNP in the last 8760 hours. HbA1C: No results for input(s): HGBA1C in the last 72 hours. CBG: No results for input(s): GLUCAP in the last 168 hours. Lipid Profile: No results for input(s): CHOL, HDL, LDLCALC, TRIG, CHOLHDL, LDLDIRECT in the last 72 hours. Thyroid Function Tests: No results for input(s): TSH, T4TOTAL, FREET4, T3FREE, THYROIDAB in the last 72 hours. Anemia Panel: No results for input(s): VITAMINB12, FOLATE, FERRITIN, TIBC, IRON, RETICCTPCT in the last 72 hours. Urine analysis:    Component Value Date/Time   COLORURINE YELLOW 10/20/2018 1900   APPEARANCEUR CLOUDY (A) 10/20/2018 1900   LABSPEC 1.005 10/20/2018 1900   PHURINE 6.0 10/20/2018 1900   GLUCOSEU NEGATIVE 10/20/2018 1900   HGBUR SMALL (A) 10/20/2018 1900   BILIRUBINUR NEGATIVE 10/20/2018 1900   KETONESUR NEGATIVE 10/20/2018 1900   PROTEINUR NEGATIVE 10/20/2018 1900   NITRITE NEGATIVE 10/20/2018 1900   LEUKOCYTESUR LARGE (A) 10/20/2018 1900   Sepsis Labs:  (procalcitonin:4,lacticidven:4)  ) Recent Results (from the past 240 hour(s))  Blood culture (routine x 2)     Status: None (Preliminary result)   Collection Time: 10/20/18  6:33 PM  Result Value Ref Range Status   Specimen Description BLOOD LEFT ANTECUBITAL  Final   Special Requests   Final    BOTTLES DRAWN AEROBIC AND ANAEROBIC Blood Culture results may not be optimal due to an inadequate volume of blood received in culture bottles   Culture   Final    NO GROWTH 3 DAYS Performed at [facility], [address], [place], [place] [address]    Report Status PENDING  Incomplete  Urine culture     Status: Abnormal   Collection Time: 10/20/18  6:34 PM  Result Value Ref Range Status   Specimen Description URINE, RANDOM  Final   Special Requests   Final    NONE Performed at [facility]  Lab, [address], [place], [place] [address]    Culture >=100,000 COLONIES/mL ESCHERICHIA COLI (A)  Final   Report Status 10/23/2018 FINAL  Final   Organism ID, Bacteria ESCHERICHIA COLI (A)  Final      Susceptibility   Escherichia coli - MIC*    AMPICILLIN >=32 RESISTANT Resistant     CEFAZOLIN <=4 SENSITIVE Sensitive     CEFTRIAXONE <=1 SENSITIVE Sensitive     CIPROFLOXACIN >=4 RESISTANT Resistant     GENTAMICIN >=16 RESISTANT Resistant     IMIPENEM <=0.25 SENSITIVE Sensitive     NITROFURANTOIN <=16 SENSITIVE Sensitive     TRIMETH/SULFA >=320 RESISTANT Resistant     AMPICILLIN/SULBACTAM >=32 RESISTANT Resistant     PIP/TAZO >=128 RESISTANT Resistant     Extended ESBL NEGATIVE Sensitive     * >=100,000 COLONIES/mL ESCHERICHIA COLI  SARS Coronavirus 2 (CEPHEID- Performed in [facility] hospital lab), Hosp Order     Status: None   Collection Time: 10/20/18  6:34 PM  Result Value Ref Range Status   SARS Coronavirus 2 NEGATIVE NEGATIVE Final    Comment: (NOTE) If result is NEGATIVE SARS-CoV-2 target nucleic acids are NOT DETECTED. The SARS-CoV-2 RNA is generally detectable  in upper and lower  respiratory specimens during the acute phase of infection. The lowest  concentration of SARS-CoV-2 viral copies this assay can detect is 250  copies / mL. A negative result does not preclude SARS-CoV-2 infection  and should not be used as the sole basis for treatment or other  patient management decisions.  A negative result may occur with  improper specimen collection / handling, submission of specimen other  than nasopharyngeal swab, presence of viral mutation(s) within the  areas targeted by this assay, and inadequate number of viral copies  (<250 copies / mL). A negative result must be combined with clinical  observations, patient history, and epidemiological information. If result is POSITIVE SARS-CoV-2 target nucleic acids are DETECTED. The SARS-CoV-2 RNA is generally detectable in upper and lower  respiratory specimens dur ing the acute phase of infection.  Positive  results are indicative of active infection with SARS-CoV-2.  Clinical  correlation with patient history and other diagnostic information is  necessary to determine patient infection status.  Positive results do  not rule out bacterial infection or co-infection with other viruses. If result is PRESUMPTIVE POSTIVE SARS-CoV-2 nucleic acids MAY BE PRESENT.   A presumptive positive result was obtained on the submitted specimen  and confirmed on repeat testing.  While 2019 novel coronavirus  (SARS-CoV-2) nucleic acids may be present in the submitted sample  additional confirmatory testing may be necessary for epidemiological  and / or clinical management purposes  to differentiate between  SARS-CoV-2 and other Sarbecovirus currently known to infect humans.  If clinically indicated additional testing with an alternate test  methodology [phone]) is advised. The SARS-CoV-2 RNA is generally  detectable in upper and lower respiratory sp ecimens during the acute  phase of infection. The expected result is  Negative. Fact Sheet for Patients:  BoilerBrush.com.cy Fact Sheet for Healthcare Providers: https://pope.com/ This test is not yet approved or cleared by the [place] FDA and has been authorized for detection and/or diagnosis of SARS-CoV-2 by FDA under an Emergency Use Authorization (EUA).  This EUA will remain in effect (meaning this test can be used) for the duration of the COVID-19 declaration under Section 564(b)(1) of the Act, 21 U.S.C. section 360bbb-3(b)(1), unless the authorization is terminated or revoked sooner. Performed at  [facility], [address] [place] [place], [place] [address]   Blood culture (routine x 2)     Status: None (Preliminary result)   Collection Time: 10/20/18  6:39 PM  Result Value Ref Range Status   Specimen Description BLOOD LEFT HAND  Final   Special Requests   Final    BOTTLES DRAWN AEROBIC AND ANAEROBIC Blood Culture adequate volume   Culture   Final    NO GROWTH 3 DAYS Performed at [facility], [address] [place], [place] [address]    Report Status PENDING  Incomplete  Respiratory Panel by PCR     Status: None   Collection Time: 10/21/18  3:10 AM  Result Value Ref Range Status   Adenovirus NOT DETECTED NOT DETECTED Final   Coronavirus 229E NOT DETECTED NOT DETECTED Final    Comment: (NOTE) The Coronavirus on the Respiratory Panel, DOES NOT test for the novel  Coronavirus (2019 nCoV)    Coronavirus HKU1 NOT DETECTED NOT DETECTED Final   Coronavirus NL63 NOT DETECTED NOT DETECTED Final   Coronavirus OC43 NOT DETECTED NOT DETECTED Final   Metapneumovirus NOT DETECTED NOT DETECTED Final   Rhinovirus / Enterovirus NOT DETECTED NOT DETECTED Final   Influenza B NOT DETECTED NOT DETECTED Final   Parainfluenza Virus 1 NOT DETECTED NOT DETECTED Final   Parainfluenza Virus 2 NOT DETECTED NOT DETECTED Final   Parainfluenza Virus 3 NOT DETECTED NOT DETECTED Final   Parainfluenza Virus 4  NOT DETECTED NOT DETECTED Final   Respiratory Syncytial Virus NOT DETECTED NOT DETECTED Final   Bordetella pertussis NOT DETECTED NOT DETECTED Final   Chlamydophila pneumoniae NOT DETECTED NOT DETECTED Final   Mycoplasma pneumoniae NOT DETECTED NOT DETECTED Final    Comment: Performed at [facility], [address] [place], [place] [address]  Expectorated sputum assessment w rflx to resp cult     Status: None   Collection Time: 10/21/18 11:03 AM  Result Value Ref Range Status   Specimen Description SPUTUM  Final   Special Requests NONE  Final   Sputum evaluation   Final    THIS SPECIMEN IS ACCEPTABLE FOR SPUTUM CULTURE Performed at [facility], [address], [place], [place] [address]    Report Status 10/21/2018 FINAL  Final  Culture, respiratory     Status: None   Collection Time: 10/21/18 11:03 AM  Result Value Ref Range Status   Specimen Description SPUTUM  Final   Special Requests NONE Reflexed from [id number]  Final   Gram Stain   Final    NO WBC SEEN FEW GRAM POSITIVE COCCI IN PAIRS IN CLUSTERS    Culture   Final    FEW Consistent with normal respiratory flora. Performed at [facility], [address] [place], [place] [address]    Report Status 10/23/2018 FINAL  Final  Culture, blood (routine x 2)     Status: None (Preliminary result)   Collection Time: 10/22/18 10:33 AM  Result Value Ref Range Status   Specimen Description BLOOD RIGHT HAND  Final   Special Requests   Final    BOTTLES DRAWN AEROBIC ONLY Blood Culture adequate volume   Culture   Final    NO GROWTH < 24 HOURS Performed at [facility], [address] [place], [place] [address]    Report Status PENDING  Incomplete  Culture, blood (routine x 2)     Status: None (Preliminary result)   Collection  Time: 10/22/18 10:33 AM  Result Value Ref Range Status   Specimen Description BLOOD LEFT HAND  Final   Special Requests   Final    BOTTLES DRAWN AEROBIC ONLY Blood Culture adequate  volume   Culture   Final    NO GROWTH < 24 HOURS Performed at [facility], [address] [place], [place] [address]    Report Status PENDING  Incomplete      Studies: No results found.  Scheduled Meds: . amLODipine  10 mg Oral Daily  . bisoprolol  5 mg Oral Daily  . calcium carbonate  2 tablet Oral TID PC  . feeding supplement (ENSURE ENLIVE)  237 mL Oral TID BM  . heparin injection (subcutaneous)  5,000 Units Subcutaneous Q8H  . multivitamin with minerals  1 tablet Oral Daily  . tamsulosin  0.8 mg Oral QPC supper    Continuous Infusions: . sodium chloride 50 mL/hr at 10/23/18 0419  . sodium chloride Stopped (10/23/18 0000)  . cefTRIAXone (ROCEPHIN)  IV Stopped (10/22/18 2320)     LOS: 2 days     Darlin Droparole N Kyona Chauncey, MD [facility] Pager [phone]  If 7PM-7AM, please contact night-coverage [email or web address] Password [email or web address] 10/23/2018, 1:46 PM

## 2018-10-23 NOTE — Plan of Care (Signed)
  Problem: Health Behavior/Discharge Planning: Goal: Ability to manage health-related needs will improve Outcome: Progressing   Problem: Activity: Goal: Risk for activity intolerance will decrease Outcome: Progressing   Problem: Nutrition: Goal: Adequate nutrition will be maintained Outcome: Progressing   

## 2018-10-24 ENCOUNTER — Inpatient Hospital Stay (HOSPITAL_COMMUNITY): Payer: Medicare Other

## 2018-10-24 LAB — CBC WITH DIFFERENTIAL/PLATELET
Abs Immature Granulocytes: 0.08 10*3/uL — ABNORMAL HIGH (ref 0.00–0.07)
Basophils Absolute: 0.1 10*3/uL (ref 0.0–0.1)
Basophils Relative: 1 %
Eosinophils Absolute: 0.3 10*3/uL (ref 0.0–0.5)
Eosinophils Relative: 2 %
HCT: 45.4 % (ref 39.0–52.0)
Hemoglobin: 15.2 g/dL (ref 13.0–17.0)
Immature Granulocytes: 1 %
Lymphocytes Relative: 9 %
Lymphs Abs: 1.2 10*3/uL (ref 0.7–4.0)
MCH: 31.5 pg (ref 26.0–34.0)
MCHC: 33.5 g/dL (ref 30.0–36.0)
MCV: 94.2 fL (ref 80.0–100.0)
Monocytes Absolute: 1.3 10*3/uL — ABNORMAL HIGH (ref 0.1–1.0)
Monocytes Relative: 9 %
Neutro Abs: 11.5 10*3/uL — ABNORMAL HIGH (ref 1.7–7.7)
Neutrophils Relative %: 78 %
Platelets: 194 10*3/uL (ref 150–400)
RBC: 4.82 MIL/uL (ref 4.22–5.81)
RDW: 13.4 % (ref 11.5–15.5)
WBC: 14.5 10*3/uL — ABNORMAL HIGH (ref 4.0–10.5)
nRBC: 0 % (ref 0.0–0.2)

## 2018-10-24 LAB — BASIC METABOLIC PANEL
Anion gap: 14 (ref 5–15)
BUN: 43 mg/dL — ABNORMAL HIGH (ref 8–23)
CO2: 24 mmol/L (ref 22–32)
Calcium: 9.8 mg/dL (ref 8.9–10.3)
Chloride: 103 mmol/L (ref 98–111)
Creatinine, Ser: 1.44 mg/dL — ABNORMAL HIGH (ref 0.61–1.24)
GFR calc Af Amer: 51 mL/min — ABNORMAL LOW (ref >60)
GFR calc non Af Amer: 44 mL/min — ABNORMAL LOW (ref >60)
Glucose, Bld: 103 mg/dL — ABNORMAL HIGH (ref 70–99)
Potassium: 3.6 mmol/L (ref 3.5–5.1)
Sodium: 141 mmol/L (ref 135–145)

## 2018-10-24 LAB — HEMOGLOBIN AND HEMATOCRIT, BLOOD
HCT: 45.9 % (ref 39.0–52.0)
Hemoglobin: 15.1 g/dL (ref 13.0–17.0)

## 2018-10-24 LAB — PROCALCITONIN: Procalcitonin: 0.15 ng/mL

## 2018-10-24 MED ORDER — SODIUM CHLORIDE 0.45 % IV SOLN
INTRAVENOUS | Status: DC
  Administered 2018-10-24 (×2): via INTRAVENOUS

## 2018-10-24 NOTE — Progress Notes (Signed)
Dr.Hall called back reported would order hgb and hct follow up on bloody stools. Will alertt next shift to observe.

## 2018-10-24 NOTE — Progress Notes (Signed)
PROGRESS NOTE  Maysin Moehring JSE:[id number] DOB: 01/16/1931 DOA: 10/20/2018 PCP: Gordan Payment., MD  HPI/Recap of past 24 hours: Randeep Krafft is a 85 y.o. male with medical history significant of hypertension, hyperlipidemia, stroke with mild left-sided weakness, BPH, PAF not on anticoagulants, Parkinson's disease, pontine hemorrhage 2018, CKD-3, who presents with cough, SOB and leg edema from home.  On presentation to the ED UA positive for pyuria and severely hypertensive.  Admitted for UTI and hypertensive emergency.  10/24/18: Patient seen and examined his bedside.  No acute events overnight.  Generalized weakness with difficulty feeling self.  Feeding assistance.  PT recommended home health PT.    Assessment/Plan: Principal Problem:   Hypertensive urgency Active Problems:   PAF (paroxysmal atrial fibrillation) (HCC)   History of CVA (cerebrovascular accident)   CKD (chronic kidney disease), stage III (HCC)   UTI (urinary tract infection)   Cough   Complicated UTI in the setting of BPH and urinary retention Presented with positive urine analysis Worsening leukocytosis noted this morning with WBC 24K from 19 K Continue Rocephin Urine culture grew greater than 100,000 colonies of E. coli with multidrug resistance. Sensitive to Rocephin Blood cultures x2- to date Afebrile Leukocytosis is trending down WBC 14 K from 18 K yesterday Repeat CBC in the morning  AKI on CKD 3 Baseline creatinine appears to be 1.3 with GFR 45 Creatinine peaked at 2.5 on 10/22/2018 Creatinine is improving with IV fluid Creatinine 1.44 on 10/24/2018 Continue gentle IV fluid hydration half-normal saline at 75 cc/h Encourage oral fluid intake Monitor urine output Repeat BMP in the morning  Acute urinary retention likely secondary to BPH Bladder scan revealed 500 cc urine Foley catheter placed on 10/23/2018 Continue to closely monitor urine output Will need to follow-up with urology outpatient   Resolved hypertensive emergency Presented with blood pressure 221/127 and AKI Blood pressure is normotensive Continue amlodipine 10 mg daily and bisoprolol 5 mg daily Continue to monitor vital signs  Resolved hypokalemia post repletion   BPH with urinary retention Continue tamsulosin Will need to follow-up with urology outpatient  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 fib not on oral anticoagulation due to history of pontine hemorrhage Bradycardic on presentation Bradycardia has now resolved after holding beta-blocker     DVT ppx: Subcu heparin 3 times daily Code Status: Full code Family Communication: We will call to update. Disposition Plan: Possible discharge tomorrow 10/25/2018   Consults called:  none    Objective: Vitals:   10/23/18 1923 10/24/18 0449 10/24/18 0933 10/24/18 1201  BP: 139/68 132/73 139/84 140/63  Pulse: 78 76 88 78  Resp: 18 18 16 18   Temp: 97.8 F (36.6 C) 98.3 F (36.8 C) 97.8 F (36.6 C) 98.4 F (36.9 C)  TempSrc: Oral Oral Oral Oral  SpO2: 98% 98% 98% 96%  Weight:  72.8 kg    Height:        Intake/Output Summary (Last 24 hours) at 10/24/2018 1435 Last data filed at 10/24/2018 1300 Gross per 24 hour  Intake 913.87 ml  Output 1400 ml  Net -486.13 ml   Filed Weights   10/22/18 0131 10/23/18 0427 10/24/18 0449  Weight: 76.5 kg 73.6 kg 72.8 kg    Exam:  . General: 85 y.o. year-old male chronically ill-appearing in no acute distress.  Alert and minimally interactive. . Cardiovascular: Regular rate and rhythm with no rubs or gallops.  No JVD or thyromegaly noted. Marland Kitchen Respiratory: Mild rales  at bases with no wheezes noted.  Poor inspiratory effort.   . Abdomen: Soft nontender nondistended with normal bowel sounds x4 quadrants.   . Musculoskeletal: No lower extremity edema.  2 out of 4 pulses in all 4 extremities. Marland Kitchen. Psychiatry: Mood is appropriate for condition and setting.   Data  Reviewed: CBC: Recent Labs  Lab 10/20/18 1820 10/21/18 0444 10/22/18 0648 10/23/18 0850 10/24/18 0654  WBC 8.5 19.6* 24.4* 18.3* 14.5*  NEUTROABS 5.8  --   --  15.4* 11.5*  HGB 13.5 15.5 15.8 14.7 15.2  HCT 41.6 45.4 45.6 44.0 45.4  MCV 95.6 91.5 91.6 94.2 94.2  PLT 196 215 218 187 194   Basic Metabolic Panel: Recent Labs  Lab 10/20/18 1820 10/21/18 0444 10/22/18 0648 10/23/18 0850 10/24/18 0654  NA 138 140 141 141 141  K 4.2 3.3* 4.8 4.0 3.6  CL 105 103 105 105 103  CO2 24 23 23 24 24   GLUCOSE 95 147* 146* 132* 103*  BUN 22 21 31* 38* 43*  CREATININE 1.69* 1.73* 2.51* 2.02* 1.44*  CALCIUM 9.5 9.6 10.6* 9.8 9.8  MG  --   --  2.0  --   --    GFR: Estimated Creatinine Clearance: 35.1 mL/min (A) (by C-G formula based on SCr of 1.44 mg/dL (H)). Liver Function Tests: Recent Labs  Lab 10/20/18 1820 10/23/18 0850  AST 34 36  ALT 14 16  ALKPHOS 56 60  BILITOT 1.0 0.9  PROT 7.2 6.7  ALBUMIN 3.8 3.1*   No results for input(s): LIPASE, AMYLASE in the last 168 hours. No results for input(s): AMMONIA in the last 168 hours. Coagulation Profile: No results for input(s): INR, PROTIME in the last 168 hours. Cardiac Enzymes: Recent Labs  Lab 10/20/18 1820  TROPONINI <0.03   BNP (last 3 results) No results for input(s): PROBNP in the last 8760 hours. HbA1C: No results for input(s): HGBA1C in the last 72 hours. CBG: No results for input(s): GLUCAP in the last 168 hours. Lipid Profile: No results for input(s): CHOL, HDL, LDLCALC, TRIG, CHOLHDL, LDLDIRECT in the last 72 hours. Thyroid Function Tests: No results for input(s): TSH, T4TOTAL, FREET4, T3FREE, THYROIDAB in the last 72 hours. Anemia Panel: No results for input(s): VITAMINB12, FOLATE, FERRITIN, TIBC, IRON, RETICCTPCT in the last 72 hours. Urine analysis:    Component Value Date/Time   COLORURINE YELLOW 10/20/2018 1900   APPEARANCEUR CLOUDY (A) 10/20/2018 1900   LABSPEC 1.005 10/20/2018 1900   PHURINE 6.0  10/20/2018 1900   GLUCOSEU NEGATIVE 10/20/2018 1900   HGBUR SMALL (A) 10/20/2018 1900   BILIRUBINUR NEGATIVE 10/20/2018 1900   KETONESUR NEGATIVE 10/20/2018 1900   PROTEINUR NEGATIVE 10/20/2018 1900   NITRITE NEGATIVE 10/20/2018 1900   LEUKOCYTESUR LARGE (A) 10/20/2018 1900   Sepsis Labs: @LABRCNTIP (procalcitonin:4,lacticidven:4)  ) Recent Results (from the past 240 hour(s))  Blood culture (routine x 2)     Status: None (Preliminary result)   Collection Time: 10/20/18  6:33 PM  Result Value Ref Range Status   Specimen Description BLOOD LEFT ANTECUBITAL  Final   Special Requests   Final    BOTTLES DRAWN AEROBIC AND ANAEROBIC Blood Culture results may not be optimal due to an inadequate volume of blood received in culture bottles   Culture   Final    NO GROWTH 4 DAYS Performed at [facility], [address] [place], [place] [address]    Report Status PENDING  Incomplete  Urine culture     Status:  Abnormal   Collection Time: 10/20/18  6:34 PM  Result Value Ref Range Status   Specimen Description URINE, RANDOM  Final   Special Requests   Final    NONE Performed at [facility], [address], [place], [place] [address]    Culture >=100,000 COLONIES/mL ESCHERICHIA COLI (A)  Final   Report Status 10/23/2018 FINAL  Final   Organism ID, Bacteria ESCHERICHIA COLI (A)  Final      Susceptibility   Escherichia coli - MIC*    AMPICILLIN >=32 RESISTANT Resistant     CEFAZOLIN <=4 SENSITIVE Sensitive     CEFTRIAXONE <=1 SENSITIVE Sensitive     CIPROFLOXACIN >=4 RESISTANT Resistant     GENTAMICIN >=16 RESISTANT Resistant     IMIPENEM <=0.25 SENSITIVE Sensitive     NITROFURANTOIN <=16 SENSITIVE Sensitive     TRIMETH/SULFA >=320 RESISTANT Resistant     AMPICILLIN/SULBACTAM >=32 RESISTANT Resistant     PIP/TAZO >=128 RESISTANT Resistant     Extended ESBL NEGATIVE Sensitive     * >=100,000 COLONIES/mL ESCHERICHIA COLI  SARS Coronavirus 2 (CEPHEID- Performed in [facility]  hospital lab), Hosp Order     Status: None   Collection Time: 10/20/18  6:34 PM  Result Value Ref Range Status   SARS Coronavirus 2 NEGATIVE NEGATIVE Final    Comment: (NOTE) If result is NEGATIVE SARS-CoV-2 target nucleic acids are NOT DETECTED. The SARS-CoV-2 RNA is generally detectable in upper and lower  respiratory specimens during the acute phase of infection. The lowest  concentration of SARS-CoV-2 viral copies this assay can detect is 250  copies / mL. A negative result does not preclude SARS-CoV-2 infection  and should not be used as the sole basis for treatment or other  patient management decisions.  A negative result may occur with  improper specimen collection / handling, submission of specimen other  than nasopharyngeal swab, presence of viral mutation(s) within the  areas targeted by this assay, and inadequate number of viral copies  (<250 copies / mL). A negative result must be combined with clinical  observations, patient history, and epidemiological information. If result is POSITIVE SARS-CoV-2 target nucleic acids are DETECTED. The SARS-CoV-2 RNA is generally detectable in upper and lower  respiratory specimens dur ing the acute phase of infection.  Positive  results are indicative of active infection with SARS-CoV-2.  Clinical  correlation with patient history and other diagnostic information is  necessary to determine patient infection status.  Positive results do  not rule out bacterial infection or co-infection with other viruses. If result is PRESUMPTIVE POSTIVE SARS-CoV-2 nucleic acids MAY BE PRESENT.   A presumptive positive result was obtained on the submitted specimen  and confirmed on repeat testing.  While 2019 novel coronavirus  (SARS-CoV-2) nucleic acids may be present in the submitted sample  additional confirmatory testing may be necessary for epidemiological  and / or clinical management purposes  to differentiate between  SARS-CoV-2 and other  Sarbecovirus currently known to infect humans.  If clinically indicated additional testing with an alternate test  methodology [id number]) is advised. The SARS-CoV-2 RNA is generally  detectable in upper and lower respiratory sp ecimens during the acute  phase of infection. The expected result is Negative. Fact Sheet for Patients:  BoilerBrush.com.cy Fact Sheet for Healthcare Providers: https://pope.com/ This test is not yet approved or cleared by the [place] FDA and has been authorized for detection and/or diagnosis of SARS-CoV-2 by FDA under an Emergency Use Authorization (EUA).  This EUA will remain in effect (meaning this test can be used) for the duration of the COVID-19 declaration under Section 564(b)(1) of the Act, 21 U.S.C. section 360bbb-3(b)(1), unless the authorization is terminated or revoked sooner. Performed at [facility], [address] [place], [place] [address]   Blood culture (routine x 2)     Status: None (Preliminary result)   Collection Time: 10/20/18  6:39 PM  Result Value Ref Range Status   Specimen Description BLOOD LEFT HAND  Final   Special Requests   Final    BOTTLES DRAWN AEROBIC AND ANAEROBIC Blood Culture adequate volume   Culture   Final    NO GROWTH 4 DAYS Performed at [facility], [address], [place], [place] [address]    Report Status PENDING  Incomplete  Respiratory Panel by PCR     Status: None   Collection Time: 10/21/18  3:10 AM  Result Value Ref Range Status   Adenovirus NOT DETECTED NOT DETECTED Final   Coronavirus 229E NOT DETECTED NOT DETECTED Final    Comment: (NOTE) The Coronavirus on the Respiratory Panel, DOES NOT test for the novel  Coronavirus (2019 nCoV)    Coronavirus HKU1 NOT DETECTED NOT DETECTED Final   Coronavirus NL63 NOT DETECTED NOT DETECTED Final   Coronavirus OC43 NOT DETECTED NOT DETECTED Final   Metapneumovirus NOT DETECTED NOT DETECTED Final    Rhinovirus / Enterovirus NOT DETECTED NOT DETECTED Final   Influenza B NOT DETECTED NOT DETECTED Final   Parainfluenza Virus 1 NOT DETECTED NOT DETECTED Final   Parainfluenza Virus 2 NOT DETECTED NOT DETECTED Final   Parainfluenza Virus 3 NOT DETECTED NOT DETECTED Final   Parainfluenza Virus 4 NOT DETECTED NOT DETECTED Final   Respiratory Syncytial Virus NOT DETECTED NOT DETECTED Final   Bordetella pertussis NOT DETECTED NOT DETECTED Final   Chlamydophila pneumoniae NOT DETECTED NOT DETECTED Final   Mycoplasma pneumoniae NOT DETECTED NOT DETECTED Final    Comment: Performed at [facility], [address] [place], [place] [address]  Expectorated sputum assessment w rflx to resp cult     Status: None   Collection Time: 10/21/18 11:03 AM  Result Value Ref Range Status   Specimen Description SPUTUM  Final   Special Requests NONE  Final   Sputum evaluation   Final    THIS SPECIMEN IS ACCEPTABLE FOR SPUTUM CULTURE Performed at [facility], [address] [place], [place] [address]    Report Status 10/21/2018 FINAL  Final  Culture, respiratory     Status: None   Collection Time: 10/21/18 11:03 AM  Result Value Ref Range Status   Specimen Description SPUTUM  Final   Special Requests NONE Reflexed from [id number]  Final   Gram Stain   Final    NO WBC SEEN FEW GRAM POSITIVE COCCI IN PAIRS IN CLUSTERS    Culture   Final    FEW Consistent with normal respiratory flora. Performed at [place] [facility], [address] [place], [place] [address]    Report Status 10/23/2018 FINAL  Final  Culture, blood (routine x 2)     Status: None (Preliminary result)   Collection Time: 10/22/18 10:33 AM  Result Value Ref Range Status   Specimen Description BLOOD RIGHT HAND  Final   Special Requests   Final    BOTTLES DRAWN AEROBIC ONLY Blood Culture adequate volume   Culture   Final    NO GROWTH 2 DAYS  Performed at [facility], [address] [place], [place] [address]    Report  Status PENDING  Incomplete  Culture, blood (routine x 2)     Status: None (Preliminary result)   Collection Time: 10/22/18 10:33 AM  Result Value Ref Range Status   Specimen Description BLOOD LEFT HAND  Final   Special Requests   Final    BOTTLES DRAWN AEROBIC ONLY Blood Culture adequate volume   Culture   Final    NO GROWTH 2 DAYS Performed at [facility], [address] [place], [place] [address]    Report Status PENDING  Incomplete      Studies: No results found.  Scheduled Meds: . amLODipine  10 mg Oral Daily  . bisoprolol  5 mg Oral Daily  . calcium carbonate  2 tablet Oral TID PC  . feeding supplement (ENSURE ENLIVE)  237 mL Oral TID BM  . heparin injection (subcutaneous)  5,000 Units Subcutaneous Q8H  . multivitamin with minerals  1 tablet Oral Daily  . tamsulosin  0.8 mg Oral QPC supper    Continuous Infusions: . sodium chloride 75 mL/hr at 10/24/18 0937  . sodium chloride Stopped (10/24/18 0038)  . cefTRIAXone (ROCEPHIN)  IV Stopped (10/24/18 0038)     LOS: 3 days     Darlin Drop, MD [facility] Pager [phone]  If 7PM-7AM, please contact night-coverage [email or web address] Password [email or web address] 10/24/2018, 2:35 PM

## 2018-10-24 NOTE — Progress Notes (Signed)
Patient noted have large bloody stool that soaked into patient's incontience pad his vs are stable and he is asytomatic of stools this time despite. Writer paged MD to inform of bloody stools await response.

## 2018-10-24 NOTE — Plan of Care (Signed)
Problem: Education: Goal: Knowledge of General Education information will improve Description Including pain rating scale, medication(s)/side effects and non-pharmacologic comfort measures 10/24/2018 1139 by Darren [name], Darren Maland G, RN Outcome: Progressing 10/24/2018 1139 by Darren [name], Darren Siebels G, RN Outcome: Progressing   Problem: Health Behavior/Discharge Planning: Goal: Ability to manage health-related needs will improve 10/24/2018 1139 by Darren [name], Darren Gorum G, RN Outcome: Progressing 10/24/2018 1139 by Darren [name], Darren Sparr G, RN Outcome: Progressing   Problem: Clinical Measurements: Goal: Ability to maintain clinical measurements within normal limits will improve 10/24/2018 1139 by Darren [name], Darren Fleig G, RN Outcome: Progressing 10/24/2018 1139 by Darren [name], Darren Loeber G, RN Outcome: Progressing Goal: Will remain free from infection 10/24/2018 1139 by Darren [name], Darren Brubacher G, RN Outcome: Progressing 10/24/2018 1139 by Darren [name], Darren Fitz G, RN Outcome: Progressing Goal: Diagnostic test results will improve 10/24/2018 1139 by Darren [name], Darren Harden G, RN Outcome: Progressing 10/24/2018 1139 by Darren [name], Darren [place] G, RN Outcome: Progressing Goal: Respiratory complications will improve 10/24/2018 1139 by Darren [name], Darren Faraci G, RN Outcome: Progressing 10/24/2018 1139 by Darren [name], Darren Ethridge G, RN Outcome: Progressing Goal: Cardiovascular complication will be avoided 10/24/2018 1139 by Darren [name], Darren Mccubbin G, RN Outcome: Progressing 10/24/2018 1139 by Darren [name], Darren Kirksey G, RN Outcome: Progressing   Problem: Activity: Goal: Risk for activity intolerance will decrease 10/24/2018 1139 by Darren [name], Darren Heckman G, RN Outcome: Progressing 10/24/2018 1139 by Darren [name], Darren Casale G, RN Outcome: Progressing   Problem: Nutrition: Goal: Adequate nutrition will be maintained 10/24/2018 1139 by Darren [name], Darren Risinger G, RN Outcome: Progressing 10/24/2018 1139 by Darren [name], Darren Devinney G, RN Outcome: Progressing   Problem: Coping: Goal: Level of anxiety will  decrease 10/24/2018 1139 by Darren [name], Darren Madill G, RN Outcome: Progressing 10/24/2018 1139 by Darren [name], Darren Giambalvo G, RN Outcome: Progressing   Problem: Elimination: Goal: Will not experience complications related to bowel motility 10/24/2018 1139 by Darren [name], Darren Hearne G, RN Outcome: Progressing 10/24/2018 1139 by Darren [name], Darren Brashier G, RN Outcome: Progressing Goal: Will not experience complications related to urinary retention 10/24/2018 1139 by Darren [name], Darren Neubauer G, RN Outcome: Progressing 10/24/2018 1139 by Darren [name], Darren Moeller G, RN Outcome: Progressing   Problem: Pain Managment: Goal: General experience of comfort will improve 10/24/2018 1139 by Darren [name], Darren Heyward G, RN Outcome: Progressing 10/24/2018 1139 by Darren [name], Darren Winograd G, RN Outcome: Progressing   Problem: Safety: Goal: Ability to remain free from injury will improve 10/24/2018 1139 by Darren [name], Darren Reh G, RN Outcome: Progressing 10/24/2018 1139 by Darren [name], Darren Whitehurst G, RN Outcome: Progressing   Problem: Skin Integrity: Goal: Risk for impaired skin integrity will decrease 10/24/2018 1139 by Darren [name], Darren Brase G, RN Outcome: Progressing 10/24/2018 1139 by Darren [name], Darren Saggese G, RN Outcome: Progressing   Problem: Education: Goal: Knowledge of disease and its progression will improve 10/24/2018 1139 by Darren [name], Darren Stern G, RN Outcome: Progressing 10/24/2018 1139 by Darren [name], Darren Wiltsey G, RN Outcome: Progressing   Problem: Health Behavior/Discharge Planning: Goal: Ability to manage health-related needs will improve 10/24/2018 1139 by Darren [name], Darren Kuiken G, RN Outcome: Progressing 10/24/2018 1139 by Darren [name], Darren Boehning G, RN Outcome: Progressing   Problem: Clinical Measurements: Goal: Complications related to the disease process or treatment will be avoided or minimized 10/24/2018 1139 by Darren [name], Darren Bardin G, RN Outcome: Progressing 10/24/2018 1139 by Darren [name], Darren Holster G, RN Outcome: Progressing Goal: Dialysis access will remain free of  complications 10/24/2018 1139 by Darren [name], Darren Czerwinski G, RN Outcome: Progressing 10/24/2018 1139 by Darren [name], Darren Galvao G, RN Outcome: Progressing   Problem: Activity: Goal: Activity intolerance will improve 10/24/2018 1139 by Darren [name], Darren Rebello G, RN Outcome: Progressing 10/24/2018 1139 by Darren [name], Darren Holdsworth G, RN Outcome: Progressing   Problem: Fluid Volume: Goal: Fluid volume balance will  be maintained or improved 10/24/2018 1139 by Darren Revel, RN Outcome: Progressing 10/24/2018 1139 by Darren Revel, RN Outcome: Progressing   Problem: Nutritional: Goal: Ability to make appropriate dietary choices will improve 10/24/2018 1139 by Darren Revel, RN Outcome: Progressing 10/24/2018 1139 by Darren Revel, RN Outcome: Progressing   Problem: Respiratory: Goal: Respiratory symptoms related to disease process will be avoided 10/24/2018 1139 by Darren Revel, RN Outcome: Progressing 10/24/2018 1139 by Darren Revel, RN Outcome: Progressing   Problem: Self-Concept: Goal: Body image disturbance will be avoided or minimized 10/24/2018 1139 by Darren Revel, RN Outcome: Progressing 10/24/2018 1139 by Darren Revel, RN Outcome: Progressing   Problem: Urinary Elimination: Goal: Progression of disease will be identified and treated 10/24/2018 1139 by Darren Revel, RN Outcome: Progressing 10/24/2018 1139 by Darren Revel, RN Outcome: Progressing

## 2018-10-24 NOTE — Plan of Care (Signed)

## 2018-10-24 NOTE — Progress Notes (Signed)
Occupational Therapy Evaluation Patient Details Name: [name] MRN: [id number] DOB: 1930/12/16 Today's Date: 10/24/2018    History of Present Illness [name] is a 85 y.o. male with medical history significant of hypertension, hyperlipidemia, stroke with mild left-sided weakness, BPH, PAF not on anticoagulants, Parkinson's disease, pontine hemorrhage 2018, CKD-3, who presents with cough, SOB and leg edema from home.  On presentation to the ED UA positive for pyuria and severely hypertensive.  Admitted for UTI and hypertensive emergency.   Clinical Impression   Per chart PTA, pt was living at home with his family, who assisted him 24/7 with ADL/IADL and functional mobility with use of RW and w/c. Pt currently requires maxA +2 to reposition in the bed. Pt presents with hemiparesis on his dominant L side. Pt able to self-feed using left hand with modA to support elbow and guide hand. Pt limited in safe mobility and independence with ADL. Per nurse, pt coughs following feeding, recommend speech consult. Due to decline in current level of function, pt would benefit from acute OT to address established goals to facilitate safe D/C to venue listed below. At this time, recommend SNF follow-up. Unable to contact family this date, but if family able to provide appropriate level of support, recommend HHOT with 24/7 physical assistance. Will continue to follow acutely.     Follow Up Recommendations  SNF;Home health OT;Supervision/Assistance - 24 hour    Equipment Recommendations  3 in 1 bedside commode;Tub/shower seat    Recommendations for Other Services Speech consult(per nsg, pt coughs after feeding and drinking)     Precautions / Restrictions Precautions Precautions: Fall Restrictions Weight Bearing Restrictions: No      Mobility Bed Mobility               General bed mobility comments: did not attempt this session  Transfers                      Balance                                           ADL either performed or assessed with clinical judgement   ADL Overall ADL's : Needs assistance/impaired;At baseline Eating/Feeding: Moderate assistance;Sitting   Grooming: Sitting;Set up;Wash/dry face   Upper Body Bathing: Moderate assistance;Sitting   Lower Body Bathing: Total assistance   Upper Body Dressing : Maximal assistance   Lower Body Dressing: Total assistance   Toilet Transfer: Total assistance;+2 for physical assistance   Toileting- Clothing Manipulation and Hygiene: Total assistance;+2 for physical assistance       Functional mobility during ADLs: Maximal assistance;Total assistance;+2 for physical assistance General ADL Comments: pt able to use LUE with modA to self-feed;     Vision Baseline Vision/History: No visual deficits Patient Visual Report: No change from baseline Vision Assessment?: Yes Ocular Range of Motion: Restricted on the left Alignment/Gaze Preference: Head tilt;Gaze right Tracking/Visual Pursuits: Unable to hold eye position out of midline;Decreased smoothness of eye movement to LEFT superior field;Decreased smoothness of eye movement to LEFT inferior field Visual Fields: No apparent deficits     Perception     Praxis      Pertinent Vitals/Pain Pain Assessment: Faces Faces Pain Scale: Hurts a little bit Pain Location: R shoulder with PROM Pain Descriptors / Indicators: Grimacing Pain Intervention(s): Monitored during session;Limited activity within patient's tolerance     Hand Dominance  Left   Extremity/Trunk Assessment Upper Extremity Assessment Upper Extremity Assessment: Generalized weakness;RUE deficits/detail;LUE deficits/detail RUE Deficits / Details: grossly 3+/5;uses RUE to compensate for LUE hemiparesis RUE Sensation: WNL RUE Coordination: decreased fine motor;decreased gross motor LUE Deficits / Details: hemiparesis from previous pontine hemmorhagic infarct;shoulder  flexion/extension 3-/5;elbow flexion/extension 3/5;limited use functionally;pt able to bring spoon to mouth with support at elbow and to maintain grip on non-builtup spoon;educated pt on benefit of incorporating LUE into ADL LUE Sensation: decreased proprioception LUE Coordination: decreased fine motor;decreased gross motor   Lower Extremity Assessment Lower Extremity Assessment: Defer to PT evaluation   Cervical / Trunk Assessment Cervical / Trunk Assessment: Kyphotic   Communication Communication Communication: Expressive difficulties(dysarthria)   Cognition Arousal/Alertness: Awake/alert Behavior During Therapy: Flat affect Overall Cognitive Status: History of cognitive impairments - at baseline                                 General Comments: pt follows one step commands with increased time   General Comments  pt demonstrated right postural preference    Exercises Exercises: General Upper Extremity General Exercises - Upper Extremity Shoulder Flexion: AAROM;Both;10 reps;Supine Shoulder Horizontal ADduction: AAROM;Both;10 reps;Supine   Shoulder Instructions      Home Living Family/patient expects to be discharged to:: Private residence Living Arrangements: Spouse/significant other;Children(lives with spouse and son ) Available Help at Discharge: Family;Available 24 hours/day Type of Home: House Home Access: Stairs to enter [employer] of Steps: 2 Entrance Stairs-Rails: None Home Layout: Able to live on main level with bedroom/bathroom;Two level(Has a ramp that they can put on house prn)     Bathroom Shower/Tub: Tub/shower unit;Curtain   Bathroom Toilet: Handicapped height     Home Equipment: Environmental consultant - 2 wheels;Cane - single point;Wheelchair - manual;Shower seat;Grab bars - tub/shower;Grab bars - toilet   Additional Comments: information from PT eval and nsg      Prior Functioning/Environment Level of Independence: Needs assistance  Gait  / Transfers Assistance Needed: wife always walks with pt min assist and pt does not use equipment at all times.  Sometimes he uses RW but they have to hold onto it so he doesn't tip it over per wife.  He also sits in wheelchair in home.  Has a cane he uses too ADL's / Homemaking Assistance Needed: They assist pt with this   Comments: Report from PT note and nsg        OT Problem List: Decreased strength;Decreased range of motion;Decreased activity tolerance;Impaired balance (sitting and/or standing);Decreased cognition;Decreased safety awareness;Decreased knowledge of use of DME or AE;Impaired tone;Impaired UE functional use;Pain      OT Treatment/Interventions: Self-care/ADL training;Therapeutic exercise;Neuromuscular education;DME and/or AE instruction;Cognitive remediation/compensation;Patient/family education;Balance training    OT Goals(Current goals can be found in the care plan section) Acute Rehab OT Goals Patient Stated Goal: to go home OT Goal Formulation: With patient Time For Goal Achievement: 11/07/18 Potential to Achieve Goals: Fair ADL Goals Pt Will Perform Eating: with set-up Pt Will Perform Grooming: with set-up Pt Will Perform Upper Body Bathing: with supervision Pt Will Transfer to Toilet: with mod assist Additional ADL Goal #1: Family/caregiver will demonstrate safe and appropriate transfer of pt to assist with ADL and functional mobility.  OT Frequency: Min 2X/week   Barriers to D/C:            Co-evaluation              AM-PAC OT "6  Clicks" Daily Activity     Outcome Measure Help from another person eating meals?: A Lot Help from another person taking care of personal grooming?: A Little Help from another person toileting, which includes using toliet, bedpan, or urinal?: Total Help from another person bathing (including washing, rinsing, drying)?: A Lot Help from another person to put on and taking off regular upper body clothing?: A Lot Help from  another person to put on and taking off regular lower body clothing?: Total 6 Click Score: 11   End of Session Nurse Communication: Mobility status  Activity Tolerance: Patient tolerated treatment well Patient left: in bed;with call bell/phone within reach;with bed alarm set  OT Visit Diagnosis: Unsteadiness on feet (R26.81);Other abnormalities of gait and mobility (R26.89);Muscle weakness (generalized) (M62.81);Feeding difficulties (R63.3);Cognitive communication deficit (R41.841);Pain;Hemiplegia and hemiparesis Symptoms and signs involving cognitive functions: Nontraumatic intracerebral hemorrhage Hemiplegia - Right/Left: Left Hemiplegia - dominant/non-dominant: Dominant Hemiplegia - caused by: Nontraumatic intracerebral hemorrhage                Time: 1610-9604 OT Time Calculation (min): 26 min Charges:  OT General Charges $OT Visit: 1 Visit OT Evaluation $OT Eval Moderate Complexity: 1 Mod OT Treatments $Self Care/Home Management : 8-22 mins  Diona Browner OTR/L Acute Rehabilitation Services Office: [phone]   Rebeca Alert 10/24/2018, 4:42 PM

## 2018-10-25 LAB — COMPREHENSIVE METABOLIC PANEL
ALT: 19 U/L (ref 0–44)
AST: 33 U/L (ref 15–41)
Albumin: 2.8 g/dL — ABNORMAL LOW (ref 3.5–5.0)
Alkaline Phosphatase: 64 U/L (ref 38–126)
Anion gap: 13 (ref 5–15)
BUN: 39 mg/dL — ABNORMAL HIGH (ref 8–23)
CO2: 25 mmol/L (ref 22–32)
Calcium: 9.2 mg/dL (ref 8.9–10.3)
Chloride: 104 mmol/L (ref 98–111)
Creatinine, Ser: 1.23 mg/dL (ref 0.61–1.24)
GFR calc Af Amer: >60 mL/min (ref >60)
GFR calc non Af Amer: 53 mL/min — ABNORMAL LOW (ref >60)
Glucose, Bld: 109 mg/dL — ABNORMAL HIGH (ref 70–99)
Potassium: 3.5 mmol/L (ref 3.5–5.1)
Sodium: 142 mmol/L (ref 135–145)
Total Bilirubin: 0.9 mg/dL (ref 0.3–1.2)
Total Protein: 6.6 g/dL (ref 6.5–8.1)

## 2018-10-25 LAB — CBC WITH DIFFERENTIAL/PLATELET
Abs Immature Granulocytes: 0.08 10*3/uL — ABNORMAL HIGH (ref 0.00–0.07)
Basophils Absolute: 0.1 10*3/uL (ref 0.0–0.1)
Basophils Relative: 1 %
Eosinophils Absolute: 0.4 10*3/uL (ref 0.0–0.5)
Eosinophils Relative: 3 %
HCT: 44.4 % (ref 39.0–52.0)
Hemoglobin: 14.7 g/dL (ref 13.0–17.0)
Immature Granulocytes: 1 %
Lymphocytes Relative: 9 %
Lymphs Abs: 1.1 10*3/uL (ref 0.7–4.0)
MCH: 31 pg (ref 26.0–34.0)
MCHC: 33.1 g/dL (ref 30.0–36.0)
MCV: 93.7 fL (ref 80.0–100.0)
Monocytes Absolute: 1.3 10*3/uL — ABNORMAL HIGH (ref 0.1–1.0)
Monocytes Relative: 11 %
Neutro Abs: 9.3 10*3/uL — ABNORMAL HIGH (ref 1.7–7.7)
Neutrophils Relative %: 75 %
Platelets: 195 10*3/uL (ref 150–400)
RBC: 4.74 MIL/uL (ref 4.22–5.81)
RDW: 13.1 % (ref 11.5–15.5)
WBC: 12.3 10*3/uL — ABNORMAL HIGH (ref 4.0–10.5)
nRBC: 0 % (ref 0.0–0.2)

## 2018-10-25 LAB — GLUCOSE, CAPILLARY: Glucose-Capillary: 110 mg/dL — ABNORMAL HIGH (ref 70–99)

## 2018-10-25 LAB — CULTURE, BLOOD (ROUTINE X 2)
Culture: NO GROWTH
Culture: NO GROWTH
Special Requests: ADEQUATE

## 2018-10-25 LAB — HEMOGLOBIN AND HEMATOCRIT, BLOOD
HCT: 43.8 % (ref 39.0–52.0)
Hemoglobin: 14.7 g/dL (ref 13.0–17.0)

## 2018-10-25 MED ORDER — CEFDINIR 250 MG/5ML PO SUSR
300.0000 mg | Freq: Two times a day (BID) | ORAL | Status: DC
  Administered 2018-10-25: 300 mg via ORAL
  Filled 2018-10-25 (×2): qty 6

## 2018-10-25 MED ORDER — CEFDINIR 300 MG PO CAPS
300.0000 mg | ORAL_CAPSULE | Freq: Two times a day (BID) | ORAL | Status: DC
  Filled 2018-10-25: qty 1

## 2018-10-25 NOTE — Plan of Care (Signed)

## 2018-10-25 NOTE — Progress Notes (Signed)
Updated son [name] on the phone.  Informed possible discharge to home with home health services tomorrow.  All questions answered to his satisfaction.

## 2018-10-25 NOTE — Evaluation (Signed)
Clinical/Bedside Swallow Evaluation Patient Details  Name: [name] MRN: [id number] Date of Birth: 1930/10/02  Today's Date: 10/25/2018 Time: SLP Start Time (ACUTE ONLY): 1335 SLP Stop Time (ACUTE ONLY): 1400 SLP Time Calculation (min) (ACUTE ONLY): 25 min  Past Medical History:  Past Medical History:  Diagnosis Date  . Benign prostatic hyperplasia (BPH) with urinary urgency   . DDD (degenerative disc disease), lumbar   . Hypertension   . Knee pain   . PAF (paroxysmal atrial fibrillation) (HCC)   . Parkinson's disease (HCC)   . Rhinitis   . Stroke [facility](HCC)    Past Surgical History:  Past Surgical History:  Procedure Laterality Date  . HERNIA REPAIR     HPI:  [name] is a 85 y.o. male with medical history significant of hypertension, hyperlipidemia, stroke with mild left-sided weakness, BPH, PAF not on anticoagulants, Parkinson's disease, pontine hemorrhage 2018, CKD-3, who presents with cough, SOB and leg edema.  Pt has been having cough for more than 2 weeks. He also has mild exertional shortness of breath.  Denies chest pain, fever or chills.  He states that he coughs up yellow-colored sputum.  No fever. Has chills.  Denies chest pain.  Patient has generalized weakness and malaise.  Patient denies nausea vomiting, diarrhea, abdominal pain.  He also denies dysuria or burning on urination.  No increased urinary frequency. He has a history of stroke with mild left-sided weakness,which has not changed. He currently denies any headache, vision changes. Pt was seen by PCP today and had CXR which showed possible PAN, and sent to ED for further evaluation and treatment. Pt has bilateral leg edema.  Most recent chest xray was showing cardiac enlargement and aortic atherosclerosis with similar appearance of increased interstitial markings compatible with pulmonary edema.   Assessment / Plan / Recommendation Clinical Impression  Clinical swallowing evaluation was completed using thin  liquids via spoon, cup and straw, pureed material and dry solids.  Nursing reported that the patient has been "choking" with intake and struggling to chew due to poorly fitting dentures.  The patient did not endorse any trouble swallowing.  Cranial nerve exam was completed and unremarkable. Lingual, labial, facial and jaw ROM and strength appeared to be adequate.  Sensation appeared to be intact.  The patient presented with an oral dysphagia.  Rotary chewing motion was not seen for solids and the patient was noted to be slow to masticate with oral residue post swallow.  He struggled to clear the material even with a cue, although it did all clear on subsequent swallow.  Swallow trigger was appreciated and overt s/s of aspiration were not seen. In addition, [facility].  Recommend a dysphagia 2 diet with thin liquids.  Encouraged nursing to downgrade to dysphagia 1 if he struggles with the dysphagia 2 tray.  ST will follow up for therapeutic diet tolerance.   SLP Visit Diagnosis: Dysphagia, unspecified (R13.10)    Aspiration Risk  Mild aspiration risk    Diet Recommendation   Dysphagia 2 with thin liquids  Medication Administration: Whole meds with liquid    Other  Recommendations Oral Care Recommendations: Oral care BID   Follow up Recommendations Other (comment)(TBD)      Frequency and Duration min 2x/week  2 weeks       Prognosis   Good     Swallow Study   General Date of Onset: 10/20/18 HPI: [name] is a 85 y.o. male with medical history significant of hypertension,  hyperlipidemia, stroke with mild left-sided weakness, BPH, PAF not on anticoagulants, Parkinson's disease, pontine hemorrhage 2018, CKD-3, who presents with cough, SOB and leg edema.  Pt has been having cough for more than 2 weeks. He also has mild exertional shortness of breath.  Denies chest pain, fever or chills.  He states that he coughs up yellow-colored sputum.  No fever. Has chills.   Denies chest pain.  Patient has generalized weakness and malaise.  Patient denies nausea vomiting, diarrhea, abdominal pain.  He also denies dysuria or burning on urination.  No increased urinary frequency. He has a history of stroke with mild left-sided weakness,which has not changed. He currently denies any headache, vision changes. Pt was seen by PCP today and had CXR which showed possible PAN, and sent to ED for further evaluation and treatment. Pt has bilateral leg edema.  Most recent chest xray was showing cardiac enlargement and aortic atherosclerosis with similar appearance of increased interstitial markings compatible with pulmonary edema. Type of Study: Bedside Swallow Evaluation Previous Swallow Assessment: None noted at [facility].  Pt was seen on CIR for cognitive deficits in 2018. Diet Prior to this Study: Dysphagia 1 (puree);Thin liquids Temperature Spikes Noted: No Respiratory Status: Room air History of Recent Intubation: No Behavior/Cognition: Alert;Cooperative;Pleasant mood Oral Cavity Assessment: Within Functional Limits Oral Care Completed by SLP: No Oral Cavity - Dentition: Missing dentition;Dentures, not available Vision: Functional for self-feeding Self-Feeding Abilities: Needs assist Patient Positioning: Upright in bed Baseline Vocal Quality: Low vocal intensity Volitional Swallow: Able to elicit    Oral/Motor/Sensory Function Overall Oral Motor/Sensory Function: Within functional limits   Ice Chips Ice chips: Not tested   Thin Liquid Thin Liquid: Within functional limits Presentation: Cup;Self Fed;Spoon;Straw    Nectar Thick Nectar Thick Liquid: Not tested   Honey Thick Honey Thick Liquid: Not tested   Puree Puree: Within functional limits Presentation: Spoon   Solid     Solid: Impaired Presentation: Spoon Oral Phase Impairments: Impaired mastication      [name], MA, CCC-SLP Acute Rehab SLP [phone]  [name] 10/25/2018,2:12 PM

## 2018-10-25 NOTE — Progress Notes (Signed)
Results for CARROL, POMPLUN (MRN [id number]) as of 10/25/2018 02:27  Ref. Range 10/25/2018 01:06  Hemoglobin Latest Ref Range: 13.0 - 17.0 g/dL 84.5  HCT Latest Ref Range: 39.0 - 52.0 % 43.8

## 2018-10-25 NOTE — Progress Notes (Signed)
Occupational Therapy Treatment Patient Details Name: [name] MRN: [id number] DOB: Jun 10, 1931 Today's Date: 10/25/2018    History of present illness [name] is a 85 y.o. male with medical history significant of hypertension, hyperlipidemia, stroke with mild left-sided weakness, BPH, PAF not on anticoagulants, Parkinson's disease, pontine hemorrhage 2018, CKD-3, who presents with cough, SOB and leg edema from home.  On presentation to the ED UA positive for pyuria and severely hypertensive.  Admitted for UTI and hypertensive emergency.   OT comments  Pt. Seen for skilled OT session.  Focus of session self feeding.  Pt. Attempting use of RUE for self feeding this day.  Unable to reach mouth without assistance.  Requires cues for chewing and swallowing.  Coughing still noted with each swallow.  Agree with current recommendation for 24/7 S, for all ADL completion.    Follow Up Recommendations  SNF;Home health OT;Supervision/Assistance - 24 hour    Equipment Recommendations  3 in 1 bedside commode;Tub/shower seat    Recommendations for Other Services Speech consult    Precautions / Restrictions Precautions Precautions: Fall       Mobility Bed Mobility                  Transfers                      Balance                                           ADL either performed or assessed with clinical judgement   ADL Overall ADL's : Needs assistance/impaired Eating/Feeding: Moderate assistance;Bed level Eating/Feeding Details (indicate cue type and reason): HOB/bed placed in seated position.  pt. attempting to use RUE for self feeding, can get to base of lower lip but requires assistance with elbow stabilization to bring fully to mouth. cues to initiate chewing and big swallow.  even with cues pt. coughs after each bite. attempted swallow of beverage after each bite to aide in productive swallow. continues to cough.   Grooming: Bed  level;Maximal assistance Grooming Details (indicate cue type and reason): applied denture cream and assisted with application of max FUD.  applied pressure for optimum adhesion.  however, FUD would not stay in place and fell out.  assisted with placing back in denture case.  appears it is not stable in max. ant. and is rocking and preventing adhesion.  pt. unable to wear FUD for eating.  notified CNA.                                General ADL Comments: pt. initiating use of RUE for self feeding but requires assistance for effective delivery to mouth     Vision       Perception     Praxis      Cognition                                                Exercises     Shoulder Instructions       General Comments      Pertinent Vitals/ Pain       Pain Assessment: No/denies pain  Home Living  Prior Functioning/Environment              Frequency  Min 2X/week        Progress Toward Goals  OT Goals(current goals can now be found in the care plan section)  Progress towards OT goals: Progressing toward goals     Plan      Co-evaluation                 AM-PAC OT "6 Clicks" Daily Activity     Outcome Measure   Help from another person eating meals?: A Lot Help from another person taking care of personal grooming?: A Little Help from another person toileting, which includes using toliet, bedpan, or urinal?: Total Help from another person bathing (including washing, rinsing, drying)?: A Lot Help from another person to put on and taking off regular upper body clothing?: A Lot Help from another person to put on and taking off regular lower body clothing?: Total 6 Click Score: 11    End of Session    OT Visit Diagnosis: Unsteadiness on feet (R26.81);Other abnormalities of gait and mobility (R26.89);Muscle weakness (generalized) (M62.81);Feeding difficulties  (R63.3);Cognitive communication deficit (R41.841);Pain;Hemiplegia and hemiparesis Hemiplegia - Right/Left: Left Hemiplegia - dominant/non-dominant: Dominant Hemiplegia - caused by: Nontraumatic intracerebral hemorrhage   Activity Tolerance Patient tolerated treatment well   Patient Left in bed;with nursing/sitter in room   Nurse Communication Other (comment)(reviewed with CNA attempted use of FUD and they would not stay in patients mouth)        Time: 1275-1700 OT Time Calculation (min): 19 min  Charges: OT General Charges $OT Visit: 1 Visit OT Treatments $Self Care/Home Management : 8-22 mins  Robet Leu, COTA/L 10/25/2018, 9:21 AM

## 2018-10-25 NOTE — Progress Notes (Addendum)
PROGRESS NOTE  [name] ZOX:[id number] DOB: 08-11-1930 DOA: 10/20/2018 PCP: Gordan Payment., MD  HPI/Recap of past 24 hours: [name] is a 85 y.o. male with medical history significant of hypertension, hyperlipidemia, stroke with mild left-sided weakness, BPH, PAF not on anticoagulants, Parkinson's disease, pontine hemorrhage 2018, CKD-3, who presents with cough, SOB and leg edema from home.  On presentation to the ED UA positive for pyuria and severely hypertensive.  Admitted for UTI and hypertensive emergency.  10/25/18: Patient seen and examined at his bedside.  No acute events overnight.  Dark stool with streaks of blood reported last night.  Hemoglobin stable 14.7 from 14.7.  No sign of overt bleeding this morning.  FOBT was ordered and pending.  He is more alert and interactive today.  Dentures have been unable to be secured with possible dysphasia.  Speech therapist consulted to further assess.  Aspiration precautions in place.  Diet changed to pure to avoid aspiration.    Assessment/Plan: Principal Problem:   Hypertensive urgency Active Problems:   PAF (paroxysmal atrial fibrillation) (HCC)   History of CVA (cerebrovascular accident)   CKD (chronic kidney disease), stage III (HCC)   UTI (urinary tract infection)   Cough   Complicated UTI in the setting of BPH and urinary retention Presented with positive urine analysis Leukocytosis improving 24K>>18K>>14K>>12K Completed 6 days of Rocephin Switched to cefdinir Urine culture grew greater than 100,000 colonies of E. coli with multidrug resistance. Sensitive to Rocephin Blood cultures x2- to date Afebrile  Possible blood in stool Reported last night Hg stable 14.7K from 14.7K No sign of overt bleeding at this time No abdominal tenderness on palpation during physical exam FOBT ordered and pending  Chronic diastolic CHF Appears euvolemic on exam Chest x-ray done on 10/24/2018 independently reviewed showed  cardiomegaly with mild pulmonary edema.   DC gentle IV fluid Encourage oral intake Strict I's and O's and daily weight Continue bisoprolol  Resolving AKI on CKD 3 Baseline creatinine appears to be 1.3 with GFR 45 Creatinine peaked at 2.5 on 10/22/2018 Creatinine is improving with IV fluid Creatinine 1.44 on 10/24/2018 Continue gentle IV fluid hydration half-normal saline at 75 cc/h Encourage oral fluid intake Monitor urine output Repeat BMP in the morning  Acute urinary retention likely secondary to BPH, improving post foley cath insertion Foley catheter placed on 10/23/2018 U/O 2400 in last 24 hrs Continue to closely monitor urine output Will need to follow-up with urology outpatient  Resolved hypertensive emergency Presented with blood pressure 221/127 and AKI Blood pressure is normotensive Continue amlodipine 10 mg daily and bisoprolol 5 mg daily Continue to monitor vital signs  Resolved hypokalemia post repletion   BPH with urinary retention Continue tamsulosin Will need to follow-up with urology outpatient  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 fib not on oral anticoagulation due to history of pontine hemorrhage Bradycardia has now resolved after holding beta-blocker     DVT ppx: SCDs due to reported blood in stool on 10/24/18 Code Status: Full code Family Communication: We will call to update. Disposition Plan: Possible discharge to home with home health services tomorrow 10/26/2018   Consults called:  none    Objective: Vitals:   10/25/18 0402 10/25/18 0545 10/25/18 0749 10/25/18 1054  BP: 138/68  (!) 149/79 108/68  Pulse: (!) 42 72 72 66  Resp: (!) Temp: (!) 97.4 F (36.3 C)  (!) 97.5  F (36.4 C) 97.8 F (36.6 C)  TempSrc: Oral  Oral   SpO2: 92%  95% 97%  Weight:      Height:        Intake/Output Summary (Last 24 hours) at 10/25/2018 1108 Last  data filed at 10/25/2018 0900 Gross per 24 hour  Intake 796.52 ml  Output 2403 ml  Net -1606.48 ml   Filed Weights   10/23/18 0427 10/24/18 0449 10/25/18 0305  Weight: 73.6 kg 72.8 kg 76 kg    Exam:  . General: 85 y.o. year-old male chronically ill-appearing in no acute distress.  More alert and interactive today.   . Cardiovascular: Regular rate and rhythm with no rubs or gallops.  No JVD or thyromegaly noted.   Marland Kitchen Respiratory: Mild rales at bases with no wheezes noted.  Poor inspiratory effort.   . Abdomen: Soft nontender nondistended with normal bowel sounds x4 quadrants.   . Musculoskeletal: No lower extremity edema.  Left upper and left lower extremity appear weaker than the right but able to move against gravity. Marland Kitchen Psychiatry: Mood is appropriate for condition and setting.   Data Reviewed: CBC: Recent Labs  Lab 10/20/18 1820 10/21/18 0444 10/22/18 1610 10/23/18 0850 10/24/18 0654 10/24/18 1922 10/25/18 0106 10/25/18 0632  WBC 8.5 19.6* 24.4* 18.3* 14.5*  --   --  12.3*  NEUTROABS 5.8  --   --  15.4* 11.5*  --   --  9.3*  HGB 13.5 15.5 15.8 14.7 15.2 15.1 14.7 14.7  HCT 41.6 45.4 45.6 44.0 45.4 45.9 43.8 44.4  MCV 95.6 91.5 91.6 94.2 94.2  --   --  93.7  PLT 196 215 218 187 194  --   --  195   Basic Metabolic Panel: Recent Labs  Lab 10/21/18 0444 10/22/18 0648 10/23/18 0850 10/24/18 0654 10/25/18 0632  NA 140 141 141 141 142  K 3.3* 4.8 4.0 3.6 3.5  CL 103 105 105 103 104  CO2 GLUCOSE 147* 146* 132* 103* 109*  BUN 21 31* 38* 43* 39*  CREATININE 1.73* 2.51* 2.02* 1.44* 1.23  CALCIUM 9.6 10.6* 9.8 9.8 9.2  MG  --  2.0  --   --   --    GFR: Estimated Creatinine Clearance: 41.1 mL/min (by C-G formula based on SCr of 1.23 mg/dL). Liver Function Tests: Recent Labs  Lab 10/20/18 1820 10/23/18 0850 10/25/18 0632  AST 34 36 33  ALT ALKPHOS 56 60 64  BILITOT 1.0 0.9 0.9  PROT 7.2 6.7 6.6  ALBUMIN 3.8 3.1* 2.8*   No results  for input(s): LIPASE, AMYLASE in the last 168 hours. No results for input(s): AMMONIA in the last 168 hours. Coagulation Profile: No results for input(s): INR, PROTIME in the last 168 hours. Cardiac Enzymes: Recent Labs  Lab 10/20/18 1820  TROPONINI <0.03   BNP (last 3 results) No results for input(s): PROBNP in the last 8760 hours. HbA1C: No results for input(s): HGBA1C in the last 72 hours. CBG: No results for input(s): GLUCAP in the last 168 hours. Lipid Profile: No results for input(s): CHOL, HDL, LDLCALC, TRIG, CHOLHDL, LDLDIRECT in the last 72 hours. Thyroid Function Tests: No results for input(s): TSH, T4TOTAL, FREET4, T3FREE, THYROIDAB in the last 72 hours. Anemia Panel: No results for input(s): VITAMINB12, FOLATE, FERRITIN, TIBC, IRON, RETICCTPCT in the last 72 hours. Urine analysis:    Component Value Date/Time   COLORURINE YELLOW 10/20/2018 1900   APPEARANCEUR  CLOUDY (A) 10/20/2018 1900   LABSPEC 1.005 10/20/2018 1900   PHURINE 6.0 10/20/2018 1900   GLUCOSEU NEGATIVE 10/20/2018 1900   HGBUR SMALL (A) 10/20/2018 1900   BILIRUBINUR NEGATIVE 10/20/2018 1900   KETONESUR NEGATIVE 10/20/2018 1900   PROTEINUR NEGATIVE 10/20/2018 1900   NITRITE NEGATIVE 10/20/2018 1900   LEUKOCYTESUR LARGE (A) 10/20/2018 1900   Sepsis Labs: (procalcitonin:4,lacticidven:4)  ) Recent Results (from the past 240 hour(s))  Blood culture (routine x 2)     Status: None (Preliminary result)   Collection Time: 10/20/18  6:33 PM  Result Value Ref Range Status   Specimen Description BLOOD LEFT ANTECUBITAL  Final   Special Requests   Final    BOTTLES DRAWN AEROBIC AND ANAEROBIC Blood Culture results may not be optimal due to an inadequate volume of blood received in culture bottles   Culture   Final    NO GROWTH 4 DAYS Performed at [facility], [address], [place], [place] [address]    Report Status PENDING  Incomplete  Urine culture     Status: Abnormal   Collection  Time: 10/20/18  6:34 PM  Result Value Ref Range Status   Specimen Description URINE, RANDOM  Final   Special Requests   Final    NONE Performed at [facility], [address], [place], [place] [address]    Culture >=100,000 COLONIES/mL ESCHERICHIA COLI (A)  Final   Report Status 10/23/2018 FINAL  Final   Organism ID, Bacteria ESCHERICHIA COLI (A)  Final      Susceptibility   Escherichia coli - MIC*    AMPICILLIN >=32 RESISTANT Resistant     CEFAZOLIN <=4 SENSITIVE Sensitive     CEFTRIAXONE <=1 SENSITIVE Sensitive     CIPROFLOXACIN >=4 RESISTANT Resistant     GENTAMICIN >=16 RESISTANT Resistant     IMIPENEM <=0.25 SENSITIVE Sensitive     NITROFURANTOIN <=16 SENSITIVE Sensitive     TRIMETH/SULFA >=320 RESISTANT Resistant     AMPICILLIN/SULBACTAM >=32 RESISTANT Resistant     PIP/TAZO >=128 RESISTANT Resistant     Extended ESBL NEGATIVE Sensitive     * >=100,000 COLONIES/mL ESCHERICHIA COLI  SARS Coronavirus 2 (CEPHEID- Performed in [facility] hospital lab), Hosp Order     Status: None   Collection Time: 10/20/18  6:34 PM  Result Value Ref Range Status   SARS Coronavirus 2 NEGATIVE NEGATIVE Final    Comment: (NOTE) If result is NEGATIVE SARS-CoV-2 target nucleic acids are NOT DETECTED. The SARS-CoV-2 RNA is generally detectable in upper and lower  respiratory specimens during the acute phase of infection. The lowest  concentration of SARS-CoV-2 viral copies this assay can detect is 250  copies / mL. A negative result does not preclude SARS-CoV-2 infection  and should not be used as the sole basis for treatment or other  patient management decisions.  A negative result may occur with  improper specimen collection / handling, submission of specimen other  than nasopharyngeal swab, presence of viral mutation(s) within the  areas targeted by this assay, and inadequate number of viral copies  (<250 copies / mL). A negative result must be combined with clinical  observations,  patient history, and epidemiological information. If result is POSITIVE SARS-CoV-2 target nucleic acids are DETECTED. The SARS-CoV-2 RNA is generally detectable in upper and lower  respiratory specimens dur ing the acute phase of infection.  Positive  results are indicative of active infection with SARS-CoV-2.  Clinical  correlation with patient history and  other diagnostic information is  necessary to determine patient infection status.  Positive results do  not rule out bacterial infection or co-infection with other viruses. If result is PRESUMPTIVE POSTIVE SARS-CoV-2 nucleic acids MAY BE PRESENT.   A presumptive positive result was obtained on the submitted specimen  and confirmed on repeat testing.  While 2019 novel coronavirus  (SARS-CoV-2) nucleic acids may be present in the submitted sample  additional confirmatory testing may be necessary for epidemiological  and / or clinical management purposes  to differentiate between  SARS-CoV-2 and other Sarbecovirus currently known to infect humans.  If clinically indicated additional testing with an alternate test  methodology [phone]) is advised. The SARS-CoV-2 RNA is generally  detectable in upper and lower respiratory sp ecimens during the acute  phase of infection. The expected result is Negative. Fact Sheet for Patients:  BoilerBrush.com.cy Fact Sheet for Healthcare Providers: https://pope.com/ This test is not yet approved or cleared by the [place] FDA and has been authorized for detection and/or diagnosis of SARS-CoV-2 by FDA under an Emergency Use Authorization (EUA).  This EUA will remain in effect (meaning this test can be used) for the duration of the COVID-19 declaration under Section 564(b)(1) of the Act, 21 U.S.C. section 360bbb-3(b)(1), unless the authorization is terminated or revoked sooner. Performed at [facility], [address], [place], [place]  [address]   Blood culture (routine x 2)     Status: None (Preliminary result)   Collection Time: 10/20/18  6:39 PM  Result Value Ref Range Status   Specimen Description BLOOD LEFT HAND  Final   Special Requests   Final    BOTTLES DRAWN AEROBIC AND ANAEROBIC Blood Culture adequate volume   Culture   Final    NO GROWTH 4 DAYS Performed at [facility], [address], [place], [place] [address]    Report Status PENDING  Incomplete  Respiratory Panel by PCR     Status: None   Collection Time: 10/21/18  3:10 AM  Result Value Ref Range Status   Adenovirus NOT DETECTED NOT DETECTED Final   Coronavirus 229E NOT DETECTED NOT DETECTED Final    Comment: (NOTE) The Coronavirus on the Respiratory Panel, DOES NOT test for the novel  Coronavirus (2019 nCoV)    Coronavirus HKU1 NOT DETECTED NOT DETECTED Final   Coronavirus NL63 NOT DETECTED NOT DETECTED Final   Coronavirus OC43 NOT DETECTED NOT DETECTED Final   Metapneumovirus NOT DETECTED NOT DETECTED Final   Rhinovirus / Enterovirus NOT DETECTED NOT DETECTED Final   Influenza B NOT DETECTED NOT DETECTED Final   Parainfluenza Virus 1 NOT DETECTED NOT DETECTED Final   Parainfluenza Virus 2 NOT DETECTED NOT DETECTED Final   Parainfluenza Virus 3 NOT DETECTED NOT DETECTED Final   Parainfluenza Virus 4 NOT DETECTED NOT DETECTED Final   Respiratory Syncytial Virus NOT DETECTED NOT DETECTED Final   Bordetella pertussis NOT DETECTED NOT DETECTED Final   Chlamydophila pneumoniae NOT DETECTED NOT DETECTED Final   Mycoplasma pneumoniae NOT DETECTED NOT DETECTED Final    Comment: Performed at [facility], [address], [place], [place] [address]  Expectorated sputum assessment w rflx to resp cult     Status: None   Collection Time: 10/21/18 11:03 AM  Result Value Ref Range Status   Specimen Description SPUTUM  Final   Special Requests NONE  Final   Sputum evaluation   Final    THIS SPECIMEN IS ACCEPTABLE FOR SPUTUM CULTURE Performed  at  [facility], [address] [place], [place] [address]    Report Status 10/21/2018 FINAL  Final  Culture, respiratory     Status: None   Collection Time: 10/21/18 11:03 AM  Result Value Ref Range Status   Specimen Description SPUTUM  Final   Special Requests NONE Reflexed from [id number]  Final   Gram Stain   Final    NO WBC SEEN FEW GRAM POSITIVE COCCI IN PAIRS IN CLUSTERS    Culture   Final    FEW Consistent with normal respiratory flora. Performed at [facility], [address] [place], [place] [address]    Report Status 10/23/2018 FINAL  Final  Culture, blood (routine x 2)     Status: None (Preliminary result)   Collection Time: 10/22/18 10:33 AM  Result Value Ref Range Status   Specimen Description BLOOD RIGHT HAND  Final   Special Requests   Final    BOTTLES DRAWN AEROBIC ONLY Blood Culture adequate volume   Culture   Final    NO GROWTH 2 DAYS Performed at [facility], [address] [place], [place] [address]    Report Status PENDING  Incomplete  Culture, blood (routine x 2)     Status: None (Preliminary result)   Collection Time: 10/22/18 10:33 AM  Result Value Ref Range Status   Specimen Description BLOOD LEFT HAND  Final   Special Requests   Final    BOTTLES DRAWN AEROBIC ONLY Blood Culture adequate volume   Culture   Final    NO GROWTH 2 DAYS Performed at [facility], [address] [place], [place] [address]    Report Status PENDING  Incomplete      Studies: No results found.  Scheduled Meds: . amLODipine  10 mg Oral Daily  . bisoprolol  5 mg Oral Daily  . calcium carbonate  2 tablet Oral TID PC  . feeding supplement (ENSURE ENLIVE)  237 mL Oral TID BM  . multivitamin with minerals  1 tablet Oral Daily  . tamsulosin  0.8 mg Oral QPC supper    Continuous Infusions: . sodium chloride Stopped (10/24/18 0038)  . cefTRIAXone (ROCEPHIN)  IV 1 g (10/24/18 2343)     LOS: 4 days     Darren Droparole N Lillien Petronio, MD [facility]  Pager [phone]  If 7PM-7AM, please contact night-coverage [email or web address] Password [email or web address] 10/25/2018, 11:08 AM

## 2018-10-25 NOTE — Discharge Instructions (Addendum)
Blood Pressure Record Sheet To take your blood pressure, you will need a blood pressure machine. You can buy a blood pressure machine (blood pressure monitor) at your clinic, drug store, or online. When choosing one, consider:  An automatic monitor that has an arm cuff.  A cuff that wraps snugly around your upper arm. You should be able to fit only one finger between your arm and the cuff.  A device that stores blood pressure reading results.  Do not choose a monitor that measures your blood pressure from your wrist or finger. Follow your health care provider's instructions for how to take your blood pressure. To use this form:  Get one reading in the morning (a.m.) before you take any medicines.  Get one reading in the evening (p.m.) before supper.  Take at least 2 readings with each blood pressure check. This makes sure the results are correct. Wait 1-2 minutes between measurements.  Write down the results in the spaces on this form.  Repeat this once a week, or as told by your health care provider.  Make a follow-up appointment with your health care provider to discuss the results. Blood pressure log Date: _______________________  a.m. _____________________(1st reading) _____________________(2nd reading)  p.m. _____________________(1st reading) _____________________(2nd reading) Date: _______________________  a.m. _____________________(1st reading) _____________________(2nd reading)  p.m. _____________________(1st reading) _____________________(2nd reading) Date: _______________________  a.m. _____________________(1st reading) _____________________(2nd reading)  p.m. _____________________(1st reading) _____________________(2nd reading) Date: _______________________  a.m. _____________________(1st reading) _____________________(2nd reading)  p.m. _____________________(1st reading) _____________________(2nd reading) Date: _______________________  a.m.  _____________________(1st reading) _____________________(2nd reading)  p.m. _____________________(1st reading) _____________________(2nd reading) This information is not intended to replace advice given to you by your health care provider. Make sure you discuss any questions you have with your health care provider. Document Released: 02/23/2003 Document Revised: 05/27/2017 Document Reviewed: 05/27/2017 Elsevier Interactive Patient Education  2019 [employer] Inc.   Preventing Hypertension Hypertension, commonly called high blood pressure, is when the force of blood pumping through the arteries is too strong. Arteries are blood vessels that carry blood from the heart throughout the body. Over time, hypertension can damage the arteries and decrease blood flow to important parts of the body, including the brain, heart, and kidneys. Often, hypertension does not cause symptoms until blood pressure is very high. For this reason, it is important to have your blood pressure checked on a regular basis. Hypertension can often be prevented with diet and lifestyle changes. If you already have hypertension, you can control it with diet and lifestyle changes, as well as medicine. What nutrition changes can be made? Maintain a healthy diet. This includes:  Eating less salt (sodium). Ask your health care provider how much sodium is safe for you to have. The general recommendation is to consume less than 1 tsp (2,300 mg) of sodium a day. ? Do not add salt to your food. ? Choose low-sodium options when grocery shopping and eating out.  Limiting fats in your diet. You can do this by eating low-fat or fat-free dairy products and by eating less red meat.  Eating more fruits, vegetables, and whole grains. Make a goal to eat: ? 1-2 cups of fresh fruits and vegetables each day. ? 3-4 servings of whole grains each day.  Avoiding foods and beverages that have added sugars.  Eating fish that contain healthy fats  (omega-3 fatty acids), such as mackerel or salmon. If you need help putting together a healthy eating plan, try the DASH diet. This diet is high in fruits, vegetables, and whole  grains. It is low in sodium, red meat, and added sugars. DASH stands for Dietary Approaches to Stop Hypertension. What lifestyle changes can be made?   Lose weight if you are overweight. Losing just 3?5% of your body weight can help prevent or control hypertension. ? For example, if your present weight is 200 lb (91 kg), a loss of 3-5% of your weight means losing 6-10 lb (2.7-4.5 kg). ? Ask your health care provider to help you with a diet and exercise plan to safely lose weight.  Get enough exercise. Do at least 150 minutes of moderate-intensity exercise each week. ? You could do this in short exercise sessions several times a day, or you could do longer exercise sessions a few times a week. For example, you could take a brisk 10-minute walk or bike ride, 3 times a day, for 5 days a week.  Find ways to reduce stress, such as exercising, meditating, listening to music, or taking a yoga class. If you need help reducing stress, ask your health care provider.  Do not smoke. This includes e-cigarettes. Chemicals in tobacco and nicotine products raise your blood pressure each time you smoke. If you need help quitting, ask your health care provider.  Avoid alcohol. If you drink alcohol, limit alcohol intake to no more than 1 drink a day for nonpregnant women and 2 drinks a day for men. One drink equals 12 oz of beer, 5 oz of wine, or 1 oz of hard liquor. Why are these changes important? Diet and lifestyle changes can help you prevent hypertension, and they may make you feel better overall and improve your quality of life. If you have hypertension, making these changes will help you control it and help prevent major complications, such as:  Hardening and narrowing of arteries that supply blood to: ? Your heart. This can cause  a heart attack. ? Your brain. This can cause a stroke. ? Your kidneys. This can cause kidney failure.  Stress on your heart muscle, which can cause heart failure. What can I do to lower my risk?  Work with your health care provider to make a hypertension prevention plan that works for you. Follow your plan and keep all follow-up visits as told by your health care provider.  Learn how to check your blood pressure at home. Make sure that you know your personal target blood pressure, as told by your health care provider. How is this treated? In addition to diet and lifestyle changes, your health care provider may recommend medicines to help lower your blood pressure. You may need to try a few different medicines to find what works best for you. You also may need to take more than one medicine. Take over-the-counter and prescription medicines only as told by your health care provider. Where to find support Your health care provider can help you prevent hypertension and help you keep your blood pressure at a healthy level. Your local hospital or your community may also provide support services and prevention programs. The American Heart Association offers an online support network at: https://www.lee.net/http://supportnetwork.heart.org/high-blood-pressure Where to find more information Learn more about hypertension from:  National Heart, Lung, and Blood Institute: https://www.peterson.org/www.nhlbi.nih.gov/health/health-topics/topics/hbp  Centers for Disease Control and Prevention: AboutHD.co.nzwww.cdc.gov/bloodpressure  American Academy of Family Physicians: http://familydoctor.org/familydoctor/en/diseases-conditions/high-blood-pressure.printerview.all.html Learn more about the DASH diet from:  National Heart, Lung, and Blood Institute: WedMap.itwww.nhlbi.nih.gov/health/health-topics/topics/dash Contact a health care provider if:  You think you are having a reaction to medicines you have taken.  You have recurrent headaches  or feel dizzy.  You have  swelling in your ankles.  You have trouble with your vision. Summary  Hypertension often does not cause any symptoms until blood pressure is very high. It is important to get your blood pressure checked regularly.  Diet and lifestyle changes are the most important steps in preventing hypertension.  By keeping your blood pressure in a healthy range, you can prevent complications like heart attack, heart failure, stroke, and kidney failure.  Work with your health care provider to make a hypertension prevention plan that works for you. This information is not intended to replace advice given to you by your health care provider. Make sure you discuss any questions you have with your health care provider. Document Released: 06/11/2015 Document Revised: 02/05/2016 Document Reviewed: 02/05/2016 Elsevier Interactive Patient Education  2019 [employer].

## 2018-10-26 LAB — CBC WITH DIFFERENTIAL/PLATELET
Abs Immature Granulocytes: 0.12 10*3/uL — ABNORMAL HIGH (ref 0.00–0.07)
Basophils Absolute: 0.1 10*3/uL (ref 0.0–0.1)
Basophils Relative: 1 %
Eosinophils Absolute: 0.3 10*3/uL (ref 0.0–0.5)
Eosinophils Relative: 3 %
HCT: 45.7 % (ref 39.0–52.0)
Hemoglobin: 15.1 g/dL (ref 13.0–17.0)
Immature Granulocytes: 1 %
Lymphocytes Relative: 9 %
Lymphs Abs: 1.1 10*3/uL (ref 0.7–4.0)
MCH: 31.1 pg (ref 26.0–34.0)
MCHC: 33 g/dL (ref 30.0–36.0)
MCV: 94 fL (ref 80.0–100.0)
Monocytes Absolute: 1.4 10*3/uL — ABNORMAL HIGH (ref 0.1–1.0)
Monocytes Relative: 11 %
Neutro Abs: 8.9 10*3/uL — ABNORMAL HIGH (ref 1.7–7.7)
Neutrophils Relative %: 75 %
Platelets: 203 10*3/uL (ref 150–400)
RBC: 4.86 MIL/uL (ref 4.22–5.81)
RDW: 13.1 % (ref 11.5–15.5)
WBC: 11.9 10*3/uL — ABNORMAL HIGH (ref 4.0–10.5)
nRBC: 0 % (ref 0.0–0.2)

## 2018-10-26 LAB — OCCULT BLOOD X 1 CARD TO LAB, STOOL: Fecal Occult Bld: NEGATIVE

## 2018-10-26 MED ORDER — BISOPROLOL FUMARATE 5 MG PO TABS: 5.0000 mg | ORAL_TABLET | Freq: Every day | ORAL | 0 refills | Status: AC

## 2018-10-26 MED ORDER — TAMSULOSIN HCL 0.4 MG PO CAPS: 0.8000 mg | ORAL_CAPSULE | Freq: Every day | ORAL | 0 refills | Status: AC

## 2018-10-26 MED ORDER — CEFDINIR 300 MG PO CAPS: 300.0000 mg | ORAL_CAPSULE | Freq: Two times a day (BID) | ORAL | 0 refills | Status: AC

## 2018-10-26 MED ORDER — ADULT MULTIVITAMIN W/MINERALS CH: 1.0000 | ORAL_TABLET | Freq: Every day | ORAL | 0 refills | Status: AC

## 2018-10-26 MED ORDER — ENSURE ENLIVE PO LIQD: 237.0000 mL | Freq: Three times a day (TID) | ORAL | 0 refills | Status: AC

## 2018-10-26 MED ORDER — AMLODIPINE BESYLATE 10 MG PO TABS: 10.0000 mg | ORAL_TABLET | Freq: Every day | ORAL | 0 refills | Status: AC

## 2018-10-26 MED FILL — CEFDINIR 300 MG CAPSULE: 300 | 5 days supply | Qty: 10 | Fill #0

## 2018-10-26 MED FILL — AMLODIPINE BESYLATE 10 MG T: 10 | 30 days supply | Qty: 30 | Fill #0

## 2018-10-26 MED FILL — BISOPROLOL FUMARATE 5 MG TA: 5 | 30 days supply | Qty: 30 | Fill #0

## 2018-10-26 NOTE — TOC Transition Note (Signed)
Transition of Care [facility]) - CM/SW Discharge Note   Patient Details  Name: [name] MRN: [id number] Date of Birth: [birth year]  Transition of Care [facility]) CM/SW Contact:  Reola Mosher Phone Number: [phone] 10/26/2018, 11:15 AM   Clinical Narrative:    Patient is for discharge home today; HHC provided by [facility].   Final next level of care: Home w Home Health Services Barriers to Discharge: No Barriers Identified   Patient Goals and CMS Choice Patient states their goals for this hospitalization and ongoing recovery are:: to stay at home CMS Medicare.gov Compare Post Acute Care list provided to:: Patient Choice offered to / list presented to : Adult Children  Discharge Placement                       Discharge Plan and Services In-house Referral: NA Discharge Planning Services: CM Consult Post Acute Care Choice: NA          DME Arranged: N/A         HH Arranged: RN, PT HH Agency: [facility] (formerly [facility]) Date HH Agency Contacted: 10/23/18 Time HH Agency Contacted: 1500 Representative spoke with at [facility] Agency: Tiffany RN  Social Determinants of Health (SDOH) Interventions     Readmission Risk Interventions No flowsheet data found.

## 2018-10-26 NOTE — Progress Notes (Signed)
Physical Therapy Treatment Patient Details Name: [name] MRN: [id number] DOB: Jun 12, 1930 Today's Date: 10/26/2018    History of Present Illness [name] is a 85 y.o. male with medical history significant of hypertension, hyperlipidemia, stroke with mild left-sided weakness, BPH, PAF not on anticoagulants, Parkinson's disease, pontine hemorrhage 2018, CKD-3, who presents with cough, SOB and leg edema from home.  On presentation to the ED UA positive for pyuria and severely hypertensive.  Admitted for UTI and hypertensive emergency.    PT Comments    Pt pleasantly confused asking about how service was. Pt with improved mobility and alertness this session able to maintain attention, increase mobility to standing and pivot to chair. Pt educated for HEP but requires assist to perform. Return home with 24hr assist remains appropriate with pt still with decline in function from baseline.     Follow Up Recommendations  Home health PT;Supervision/Assistance - 24 hour     Equipment Recommendations  None recommended by PT    Recommendations for Other Services       Precautions / Restrictions Precautions Precautions: Fall Precaution Comments: incontinent, left hemiparesis, condom cath, rectal pouch Restrictions Weight Bearing Restrictions: No    Mobility  Bed Mobility Overal bed mobility: Needs Assistance Bed Mobility: Rolling;Sidelying to Sit Rolling: Mod assist Sidelying to sit: Mod assist       General bed mobility comments: cues for sequence with hand over hand to facilitate rolling, reaching for rail with assist to bring legs to eOB and elevate trunk. Initial mod assist progressing to minguard for sitting balance EOB  Transfers Overall transfer level: Needs assistance   Transfers: Sit to/from Stand;Stand Pivot Transfers Sit to Stand: Mod assist;+2 physical assistance Stand pivot transfers: Mod assist;+2 physical assistance       General transfer comment: pt  able to stand from bed with cues and assist for hand placement, left knee blocked with assist to rise from bed and from chair. Pt able to maintain standing with assist and RW. Pt able to take short steps to pivot 90degrees with chair pulled to him with total assist to sit in chair. Pt incontinent of urine in standing as condom cath not secure. Pt then stood from chair and attempted stepping but not able to significantly advance feet due to left foot inversion   Ambulation/Gait                 Stairs             Wheelchair Mobility    Modified Rankin (Stroke Patients Only)       Balance Overall balance assessment: Needs assistance;History of Falls Sitting-balance support: Feet supported;Bilateral upper extremity supported Sitting balance-Leahy Scale: Poor Sitting balance - Comments: progresses from modA to min guard A with balance   Standing balance support: Bilateral upper extremity supported Standing balance-Leahy Scale: Poor Standing balance comment: reliant on bil UE support and external assist for standing, increased flexion with fatigue                            Cognition Arousal/Alertness: Awake/alert Behavior During Therapy: Flat affect Overall Cognitive Status: History of cognitive impairments - at baseline                                 General Comments: pt follows one step commands with increased time      Exercises General Exercises -  Lower Extremity Long Arc Quad: AROM;AAROM;10 reps;Seated;Right;Left(LLE AAROM, tactile and verbal cues for RLE) Hip Flexion/Marching: AROM;AAROM;10 reps;Seated;Right;Left(LLE AAROM, tactile and verbal cues for RLE)    General Comments        Pertinent Vitals/Pain Pain Assessment: No/denies pain    Home Living                      Prior Function            PT Goals (current goals can now be found in the care plan section) Progress towards PT goals: Progressing toward  goals    Frequency    Min 3X/week      PT Plan Current plan remains appropriate    Co-evaluation              AM-PAC PT "6 Clicks" Mobility   Outcome Measure  Help needed turning from your back to your side while in a flat bed without using bedrails?: A Lot Help needed moving from lying on your back to sitting on the side of a flat bed without using bedrails?: A Lot Help needed moving to and from a bed to a chair (including a wheelchair)?: A Lot Help needed standing up from a chair using your arms (e.g., wheelchair or bedside chair)?: A Lot Help needed to walk in hospital room?: Total Help needed climbing 3-5 steps with a railing? : Total 6 Click Score: 10    End of Session Equipment Utilized During Treatment: Gait belt Activity Tolerance: Patient tolerated treatment well Patient left: in chair;with call bell/phone within reach;with chair alarm set Nurse Communication: Mobility status;Need for lift equipment PT Visit Diagnosis: Muscle weakness (generalized) (M62.81);Other abnormalities of gait and mobility (R26.89);Difficulty in walking, not elsewhere classified (R26.2)     Time: 0923-3007 PT Time Calculation (min) (ACUTE ONLY): 28 min  Charges:  $Therapeutic Activity: 23-37 mins                     Citlalli Weikel Abner Greenspan, PT Acute Rehabilitation Services Pager: [phone] Office: [phone]    Daichi Moris B Huntington Leverich 10/26/2018, 12:20 PM

## 2018-10-26 NOTE — Discharge Summary (Addendum)
Discharge Summary  [name] HYQ:[id number] DOB: [birth year]  PCP: Gordan PaymentGrisso, Greg A., MD  Admit date: 10/20/2018 Discharge date: 10/26/2018  Time spent: 35 minutes  Recommendations for Outpatient Follow-up:  1. Follow-up with your primary care provider 2. Follow-up with urology 3. Take your medications as prescribed 4. Continue physical therapy 5. Fall precautions  Discharge Diagnoses:  Active Hospital Problems   Diagnosis Date Noted  . Hypertensive urgency 10/20/2018  . CKD (chronic kidney disease), stage III (HCC) 10/20/2018  . UTI (urinary tract infection) 10/20/2018  . Cough 10/20/2018  . PAF (paroxysmal atrial fibrillation) (HCC)   . History of CVA (cerebrovascular accident)     Resolved Hospital Problems  No resolved problems to display.    Discharge Condition: Stable  Diet recommendation:    Recommendations per speech therapy: Assessment / Plan / Recommendation Clinical Impression  Clinical swallowing evaluation was completed using thin liquids via spoon, cup and straw, pureed material and dry solids.  Nursing reported that the patient has been "choking" with intake and struggling to chew due to poorly fitting dentures.  The patient did not endorse any trouble swallowing.  Cranial nerve exam was completed and unremarkable. Lingual, labial, facial and jaw ROM and strength appeared to be adequate.  Sensation appeared to be intact.  The patient presented with an oral dysphagia.  Rotary chewing motion was not seen for solids and the patient was noted to be slow to masticate with oral residue post swallow.  He struggled to clear the material even with a cue, although it did all clear on subsequent swallow.  Swallow trigger was appreciated and overt s/s of aspiration were not seen. In addition, he passed the [facility] Swallowing Protocol.  Recommend a dysphagia 2 diet with thin liquids.  Encouraged nursing to downgrade to dysphagia 1 if he struggles with the dysphagia 2 tray.  ST  will follow up for therapeutic diet tolerance.   SLP Visit Diagnosis: Dysphagia, unspecified (R13.10)    Aspiration Risk  Mild aspiration risk    Diet Recommendation   Dysphagia 2 with thin liquids  Medication Administration: Whole meds with liquid   Oral/Motor/Sensory Function Overall Oral Motor/Sensory Function: Within functional limits   Ice Chips Ice chips: Not tested   Thin Liquid Thin Liquid: Within functional limits Presentation: Cup;Self Fed;Spoon;Straw    Nectar Thick Nectar Thick Liquid: Not tested   Honey Thick Honey Thick Liquid: Not tested   Puree Puree: Within functional limits Presentation: Spoon   Solid     Solid: Impaired Presentation: Spoon Oral Phase Impairments: Impaired mastication           Vitals:   10/26/18 0429 10/26/18 1006  BP: (!) 146/84 114/62  Pulse: 83 95  Resp: 20 18  Temp: 98.1 F (36.7 C)   SpO2: 96%     History of present illness:   [name] a 85 y.o.malewith medical history significant ofhypertension, hyperlipidemia, stroke with mild left-sided weakness, BPH, PAF not on anticoagulants, Parkinson's disease, pontine hemorrhage 2018, CKD-3, who presents with cough, SOBand leg edema from home.  On presentation to the ED UA positive for pyuria and severely hypertensive.  Admitted for UTI and hypertensive emergency.  Hospital course complicated by generalized weakness and dysphagia likely related to poorly fitting dentures.  PT assessed and recommended PT with 24-hour supervision/assistance.  OT assessed and recommended SNF.  Family declined SNF.  Case manager consulted to assist with home health services at home.  10/26/18: Patient seen and examined at his bedside.  No acute  events overnight.  He has no new complaints.  He denies dysuria, abdominal pain, nausea, chest pain, palpitations, or dyspnea.  Vital signs and labs reviewed and are stable.  On the day of discharge, the patient was hemodynamically stable he will  need to follow-up with his primary care provider and urology posthospitalization.  He will also need to continue physical therapy.  Fall precautions.   Hospital Course:  Principal Problem:   Hypertensive urgency Active Problems:   PAF (paroxysmal atrial fibrillation) (HCC)   History of CVA (cerebrovascular accident)   CKD (chronic kidney disease), stage III (HCC)   UTI (urinary tract infection)   Cough  Resolving complicated UTI in the setting of BPH and urinary retention Presented with positive urine analysis Leukocytosis resolving 24K>>18K>>14K>>12K>>11K Completed 6 days of Rocephin on 10/25/2018 Continue cefdinir 300 mg twice daily x5 days Urine culture grew greater than 100,000 colonies of E. coli with multidrug resistance. Sensitive to Rocephin Blood cultures x2- to date Afebrile  Dysphagia likely secondary to poorly fitting dentures Patient advised to follow-up with his dentist Discussed with his son Dorinda Hill who agrees to follow-up with dentist Recommended.  Diet as recommended by speech therapist Aspiration precautions Feeding assistance  Questionable blood in stool Reported by bedside RN on the evening of 10/24/2018  Hemoglobin stable at 15.1 K on 10/26/2018 from 14.7K yesterday  FOBT negative No sign of overt bleeding Vital signs reviewed and are stable  Chronic diastolic CHF Appears euvolemic on exam Chest x-ray done on 10/24/2018 independently reviewed showed cardiomegaly with mild placed in pulmonary vascularity Continue bisoprolol Follow-up with your cardiologist O2 saturation 96% on room air   Resolving AKI on CKD 3 Suspect prerenal from dehydration which is improving, received gentle IV fluid hydration which was discontinued on 10/25/2018. Baseline creatinine appears to be 1.2 with GFR 53 Back to his baseline creatinine 1.23 with GFR 53 on 10/25/2018 Urine output reported thousand cc in the last 24 hours Foley catheter removed on 10/25/2018 Follow-up with  your PCP and urology  Acute urinary retention likely secondary to BPH, post foley cath insertion on 10/23/2018 and removal on 10/25/2018 Foley catheter placed on 10/23/2018 Foley cath discontinued on 10/25/2018 1000 cc urine output recorded in the last 24 hours Continue Flomax Continue in and out cath at home Follow-up with urology outpatient Discussed with Dr. Alvester Morin on 10/25/2022 who agrees to see the patient in the outpatient setting.  Resolved hypertensive emergency Presented with blood pressure 221/127 and AKI Blood pressure is currently normotensive Continue amlodipine 10 mg daily and bisoprolol 5 mg daily Follow-up with your primary care provider  Resolved hypokalemia post repletion   BPH with urinary retention Continue tamsulosin Follow-up with urology outpatient Discussed with Dr. Alvester Morin, urology who will see the patient in the outpatient setting  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 fib not on oral anticoagulation due to history of pontine hemorrhage Bradycardia has now resolved      Code Status:Full code   Consults called:Curb sided with urology Dr. Alvester Morin.  Agrees to see the patient in the outpatient setting.    Discharge Exam: BP 114/62   Pulse 95   Temp 98.1 F (36.7 C)   Resp 18   Ht  (1.702 m)   Wt 75.9 kg   SpO2 96%   BMI 26.21 kg/m  . General: 85 y.o. year-old male chronically ill-appearing in no acute distress.  Alert and interactive. . Cardiovascular:  Regular rate and rhythm with no rubs or gallops.  No thyromegaly or JVD noted.   Marland Kitchen Respiratory: Clear to auscultation with no wheezes or rales. Good inspiratory effort. . Abdomen: Soft nontender nondistended with normal bowel sounds x4 quadrants. . Musculoskeletal: Trace lower extremity edema. 2/4 pulses in all 4 extremities.  Left-side weaker than right. . Psychiatry: Mood is appropriate for  condition and setting  Discharge Instructions You were cared for by a hospitalist during your hospital stay. If you have any questions about your discharge medications or the care you received while you were in the hospital after you are discharged, you can call the unit and asked to speak with the hospitalist on call if the hospitalist that took care of you is not available. Once you are discharged, your primary care physician will handle any further medical issues. Please note that NO REFILLS for any discharge medications will be authorized once you are discharged, as it is imperative that you return to your primary care physician (or establish a relationship with a primary care physician if you do not have one) for your aftercare needs so that they can reassess your need for medications and monitor your lab values.   Allergies as of 10/26/2018      Reactions   Phenytoin Sodium Extended Other (See Comments)   Caused a fever   Statins Other (See Comments)   Muscle pains      Medication List    STOP taking these medications   bethanechol 25 MG tablet Commonly known as:  URECHOLINE   cephALEXin 250 MG capsule Commonly known as:  KEFLEX   furosemide 20 MG tablet Commonly known as:  LASIX   potassium chloride 10 MEQ tablet Commonly known as:  K-DUR     TAKE these medications   acetaminophen 325 MG tablet Commonly known as:  TYLENOL Take 325-650 mg by mouth every 6 (six) hours as needed (for pain or headaches).   amLODipine 10 MG tablet Commonly known as:  NORVASC Take 1 tablet (10 mg total) by mouth daily.   bisoprolol 5 MG tablet Commonly known as:  ZEBETA Take 1 tablet (5 mg total) by mouth daily. What changed:    medication strength  how much to take   calcium carbonate 500 MG chewable tablet Commonly known as:  TUMS - dosed in mg elemental calcium Chew 2 tablets by mouth 3 (three) times daily after meals.   cefdinir 300 MG capsule Commonly known as:  OMNICEF Take  1 capsule (300 mg total) by mouth 2 (two) times daily for 5 days.   feeding supplement (ENSURE ENLIVE) Liqd Take 237 mLs by mouth 3 (three) times daily between meals.   multivitamin with minerals Tabs tablet Take 1 tablet by mouth daily.   senna-docusate 8.6-50 MG tablet Commonly known as:  Senokot-S Take 2 tablets by mouth at bedtime.   tamsulosin 0.4 MG Caps capsule Commonly known as:  FLOMAX Take 2 capsules (0.8 mg total) by mouth daily after supper.      Allergies  Allergen Reactions  . Phenytoin Sodium Extended Other (See Comments)    Caused a fever  . Statins Other (See Comments)    Muscle pains   Follow-up Information    Gordan Payment., MD. Call in 1 day(s).   Specialty:  Internal Medicine Why:  Please call for a post hospital follow-up appointment Contact information: [address] [place] [address] [phone]        Ray Church III,  MD. Call in 1 day(s).   Specialty:  Urology Why:  Please call for a post hospital follow-up appointment Contact information: [address] [place] [address] [phone]            The results of significant diagnostics from this hospitalization (including imaging, microbiology, ancillary and laboratory) are listed below for reference.    Significant Diagnostic Studies: Dg Chest 2 View  Result Date: 10/20/2018 CLINICAL DATA:  Productive cough for 2 weeks. EXAM: CHEST - 2 VIEW COMPARISON:  PA and lateral chest 10/20/2018, 08/13/2016 and 09/05/2015. Single-view of the chest 01/20/2017. FINDINGS: There is cardiomegaly. Mild prominence of the pulmonary interstitium is somewhat more extensive on the right. No consolidative process, pneumothorax or effusion. Aortic atherosclerosis is noted. No acute or focal bony abnormality. IMPRESSION: Cardiomegaly and mild interstitial prominence likely due to edema. The appearance is unchanged since the examination earlier today. Electronically Signed   By: Drusilla Kanner M.D.   On: 10/20/2018 18:55   Dg Chest Port 1 View  Result Date: 10/24/2018 CLINICAL DATA:  Short of breath in bibasilar crackles. EXAM: PORTABLE CHEST 1 VIEW COMPARISON:  09/20/2018 FINDINGS: The heart size appears normal. Aortic atherosclerosis. Diffuse increase interstitial markings within both lung the appearance is unchanged compared with previous exam. Aortic atherosclerosis. No airspace consolidation. IMPRESSION: 1. Cardiac enlargement and aortic atherosclerosis. 2. Similar appearance of increased interstitial markings compatible with pulmonary edema. Electronically Signed   By: Signa Kell M.D.   On: 10/24/2018 12:03    Microbiology: Recent Results (from the past 240 hour(s))  Blood culture (routine x 2)     Status: None   Collection Time: 10/20/18  6:33 PM  Result Value Ref Range Status   Specimen Description BLOOD LEFT ANTECUBITAL  Final   Special Requests   Final    BOTTLES DRAWN AEROBIC AND ANAEROBIC Blood Culture results may not be optimal due to an inadequate volume of blood received in culture bottles   Culture   Final    NO GROWTH 5 DAYS Performed at [facility], [address], [place], [place] [address]    Report Status 10/25/2018 FINAL  Final  Urine culture     Status: Abnormal   Collection Time: 10/20/18  6:34 PM  Result Value Ref Range Status   Specimen Description URINE, RANDOM  Final   Special Requests   Final    NONE Performed at [facility], [address], [place], [place] [address]    Culture >=100,000 COLONIES/mL ESCHERICHIA COLI (A)  Final   Report Status 10/23/2018 FINAL  Final   Organism ID, Bacteria ESCHERICHIA COLI (A)  Final      Susceptibility   Escherichia coli - MIC*    AMPICILLIN >=32 RESISTANT Resistant     CEFAZOLIN <=4 SENSITIVE Sensitive     CEFTRIAXONE <=1 SENSITIVE Sensitive     CIPROFLOXACIN >=4 RESISTANT Resistant     GENTAMICIN >=16 RESISTANT Resistant     IMIPENEM <=0.25 SENSITIVE Sensitive      NITROFURANTOIN <=16 SENSITIVE Sensitive     TRIMETH/SULFA >=320 RESISTANT Resistant     AMPICILLIN/SULBACTAM >=32 RESISTANT Resistant     PIP/TAZO >=128 RESISTANT Resistant     Extended ESBL NEGATIVE Sensitive     * >=100,000 COLONIES/mL ESCHERICHIA COLI  SARS Coronavirus 2 (CEPHEID- Performed in [facility] hospital lab), Hosp Order     Status: None   Collection Time: 10/20/18  6:34 PM  Result Value Ref Range Status   SARS  Coronavirus 2 NEGATIVE NEGATIVE Final    Comment: (NOTE) If result is NEGATIVE SARS-CoV-2 target nucleic acids are NOT DETECTED. The SARS-CoV-2 RNA is generally detectable in upper and lower  respiratory specimens during the acute phase of infection. The lowest  concentration of SARS-CoV-2 viral copies this assay can detect is 250  copies / mL. A negative result does not preclude SARS-CoV-2 infection  and should not be used as the sole basis for treatment or other  patient management decisions.  A negative result may occur with  improper specimen collection / handling, submission of specimen other  than nasopharyngeal swab, presence of viral mutation(s) within the  areas targeted by this assay, and inadequate number of viral copies  (<250 copies / mL). A negative result must be combined with clinical  observations, patient history, and epidemiological information. If result is POSITIVE SARS-CoV-2 target nucleic acids are DETECTED. The SARS-CoV-2 RNA is generally detectable in upper and lower  respiratory specimens dur ing the acute phase of infection.  Positive  results are indicative of active infection with SARS-CoV-2.  Clinical  correlation with patient history and other diagnostic information is  necessary to determine patient infection status.  Positive results do  not rule out bacterial infection or co-infection with other viruses. If result is PRESUMPTIVE POSTIVE SARS-CoV-2 nucleic acids MAY BE PRESENT.   A presumptive positive result was obtained on the  submitted specimen  and confirmed on repeat testing.  While 2019 novel coronavirus  (SARS-CoV-2) nucleic acids may be present in the submitted sample  additional confirmatory testing may be necessary for epidemiological  and / or clinical management purposes  to differentiate between  SARS-CoV-2 and other Sarbecovirus currently known to infect humans.  If clinically indicated additional testing with an alternate test  methodology [phone]) is advised. The SARS-CoV-2 RNA is generally  detectable in upper and lower respiratory sp ecimens during the acute  phase of infection. The expected result is Negative. Fact Sheet for Patients:  BoilerBrush.com.cy Fact Sheet for Healthcare Providers: https://pope.com/ This test is not yet approved or cleared by the [place] FDA and has been authorized for detection and/or diagnosis of SARS-CoV-2 by FDA under an Emergency Use Authorization (EUA).  This EUA will remain in effect (meaning this test can be used) for the duration of the COVID-19 declaration under Section 564(b)(1) of the Act, 21 U.S.C. section 360bbb-3(b)(1), unless the authorization is terminated or revoked sooner. Performed at [facility], [address] [place], [place] [address]   Blood culture (routine x 2)     Status: None   Collection Time: 10/20/18  6:39 PM  Result Value Ref Range Status   Specimen Description BLOOD LEFT HAND  Final   Special Requests   Final    BOTTLES DRAWN AEROBIC AND ANAEROBIC Blood Culture adequate volume   Culture   Final    NO GROWTH 5 DAYS Performed at [facility], [address], [place], [place] [address]    Report Status 10/25/2018 FINAL  Final  Respiratory Panel by PCR     Status: None   Collection Time: 10/21/18  3:10 AM  Result Value Ref Range Status   Adenovirus NOT DETECTED NOT DETECTED Final   Coronavirus 229E NOT DETECTED NOT DETECTED Final    Comment: (NOTE) The  Coronavirus on the Respiratory Panel, DOES NOT test for the novel  Coronavirus (2019 nCoV)    Coronavirus HKU1 NOT DETECTED NOT DETECTED Final   Coronavirus NL63 NOT DETECTED NOT  DETECTED Final   Coronavirus OC43 NOT DETECTED NOT DETECTED Final   Metapneumovirus NOT DETECTED NOT DETECTED Final   Rhinovirus / Enterovirus NOT DETECTED NOT DETECTED Final   Influenza B NOT DETECTED NOT DETECTED Final   Parainfluenza Virus 1 NOT DETECTED NOT DETECTED Final   Parainfluenza Virus 2 NOT DETECTED NOT DETECTED Final   Parainfluenza Virus 3 NOT DETECTED NOT DETECTED Final   Parainfluenza Virus 4 NOT DETECTED NOT DETECTED Final   Respiratory Syncytial Virus NOT DETECTED NOT DETECTED Final   Bordetella pertussis NOT DETECTED NOT DETECTED Final   Chlamydophila pneumoniae NOT DETECTED NOT DETECTED Final   Mycoplasma pneumoniae NOT DETECTED NOT DETECTED Final    Comment: Performed at [facility], [address], [place], [place] [address]  Expectorated sputum assessment w rflx to resp cult     Status: None   Collection Time: 10/21/18 11:03 AM  Result Value Ref Range Status   Specimen Description SPUTUM  Final   Special Requests NONE  Final   Sputum evaluation   Final    THIS SPECIMEN IS ACCEPTABLE FOR SPUTUM CULTURE Performed at [facility], [address], [place], [place] [address]    Report Status 10/21/2018 FINAL  Final  Culture, respiratory     Status: None   Collection Time: 10/21/18 11:03 AM  Result Value Ref Range Status   Specimen Description SPUTUM  Final   Special Requests NONE Reflexed from [id number]  Final   Gram Stain   Final    NO WBC SEEN FEW GRAM POSITIVE COCCI IN PAIRS IN CLUSTERS    Culture   Final    FEW Consistent with normal respiratory flora. Performed at [facility], [address] [place], [place] [address]    Report Status 10/23/2018 FINAL  Final  Culture, blood (routine x 2)     Status: None (Preliminary result)   Collection Time: 10/22/18  10:33 AM  Result Value Ref Range Status   Specimen Description BLOOD RIGHT HAND  Final   Special Requests   Final    BOTTLES DRAWN AEROBIC ONLY Blood Culture adequate volume   Culture   Final    NO GROWTH 3 DAYS Performed at [facility], [address] [place], [place] [address]    Report Status PENDING  Incomplete  Culture, blood (routine x 2)     Status: None (Preliminary result)   Collection Time: 10/22/18 10:33 AM  Result Value Ref Range Status   Specimen Description BLOOD LEFT HAND  Final   Special Requests   Final    BOTTLES DRAWN AEROBIC ONLY Blood Culture adequate volume   Culture   Final    NO GROWTH 3 DAYS Performed at [facility], [address] [place], [place] [address]    Report Status PENDING  Incomplete     Labs: Basic Metabolic Panel: Recent Labs  Lab 10/21/18 0444 10/22/18 0648 10/23/18 0850 10/24/18 0654 10/25/18 0632  NA 140 141 141 141 142  K 3.3* 4.8 4.0 3.6 3.5  CL 103 105 105 103 104  CO2 GLUCOSE 147* 146* 132* 103* 109*  BUN 21 31* 38* 43* 39*  CREATININE 1.73* 2.51* 2.02* 1.44* 1.23  CALCIUM 9.6 10.6* 9.8 9.8 9.2  MG  --  2.0  --   --   --    Liver Function Tests: Recent Labs  Lab 10/20/18 1820 10/23/18 0850 10/25/18 0632  AST 34 36  33  ALT 14 16 19   ALKPHOS 56 60 64  BILITOT 1.0 0.9 0.9  PROT 7.2 6.7 6.6  ALBUMIN 3.8 3.1* 2.8*   No results for input(s): LIPASE, AMYLASE in the last 168 hours. No results for input(s): AMMONIA in the last 168 hours. CBC: Recent Labs  Lab 10/20/18 1820  10/22/18 0867 10/23/18 0850 10/24/18 0654 10/24/18 1922 10/25/18 0106 10/25/18 0632 10/26/18 0628  WBC 8.5   < > 24.4* 18.3* 14.5*  --   --  12.3* 11.9*  NEUTROABS 5.8  --   --  15.4* 11.5*  --   --  9.3* 8.9*  HGB 13.5   < > 15.8 14.7 15.2 15.1 14.7 14.7 15.1  HCT 41.6   < > 45.6 44.0 45.4 45.9 43.8 44.4 45.7  MCV 95.6   < > 91.6 94.2 94.2  --   --  93.7 94.0  PLT 196   < > 218 187 194  --   --  195 203    < > = values in this interval not displayed.   Cardiac Enzymes: Recent Labs  Lab 10/20/18 1820  TROPONINI <0.03   BNP: BNP (last 3 results) Recent Labs    10/20/18 1820  BNP 235.9*    ProBNP (last 3 results) No results for input(s): PROBNP in the last 8760 hours.  CBG: Recent Labs  Lab 10/25/18 2125  GLUCAP 110*       Signed:  Darlin Drop, MD [facility] 10/26/2018, 10:24 AM

## 2018-10-26 NOTE — Plan of Care (Signed)
  Problem: Clinical Measurements: Goal: Respiratory complications will improve Outcome: Progressing   Problem: Activity: Goal: Risk for activity intolerance will decrease Outcome: Progressing   Problem: Clinical Measurements: Goal: Cardiovascular complication will be avoided Outcome: Progressing   Problem: Elimination: Goal: Will not experience complications related to bowel motility Outcome: Progressing   Problem: Pain Managment: Goal: General experience of comfort will improve Outcome: Progressing   Problem: Safety: Goal: Ability to remain free from injury will improve Outcome: Progressing   Problem: Skin Integrity: Goal: Risk for impaired skin integrity will decrease Outcome: Progressing

## 2018-10-27 LAB — CULTURE, BLOOD (ROUTINE X 2)
Culture: NO GROWTH
Culture: NO GROWTH
Special Requests: ADEQUATE
Special Requests: ADEQUATE

## 2019-02-09 DEATH — deceased

## 2020-02-09 IMAGING — DX PORTABLE CHEST - 1 VIEW
1 series · 1 of 1 positions shown · non-contrast
Comparison: 09/20/2018

CLINICAL DATA: Short of breath in bibasilar crackles.

EXAM:
PORTABLE CHEST 1 VIEW

[chest]
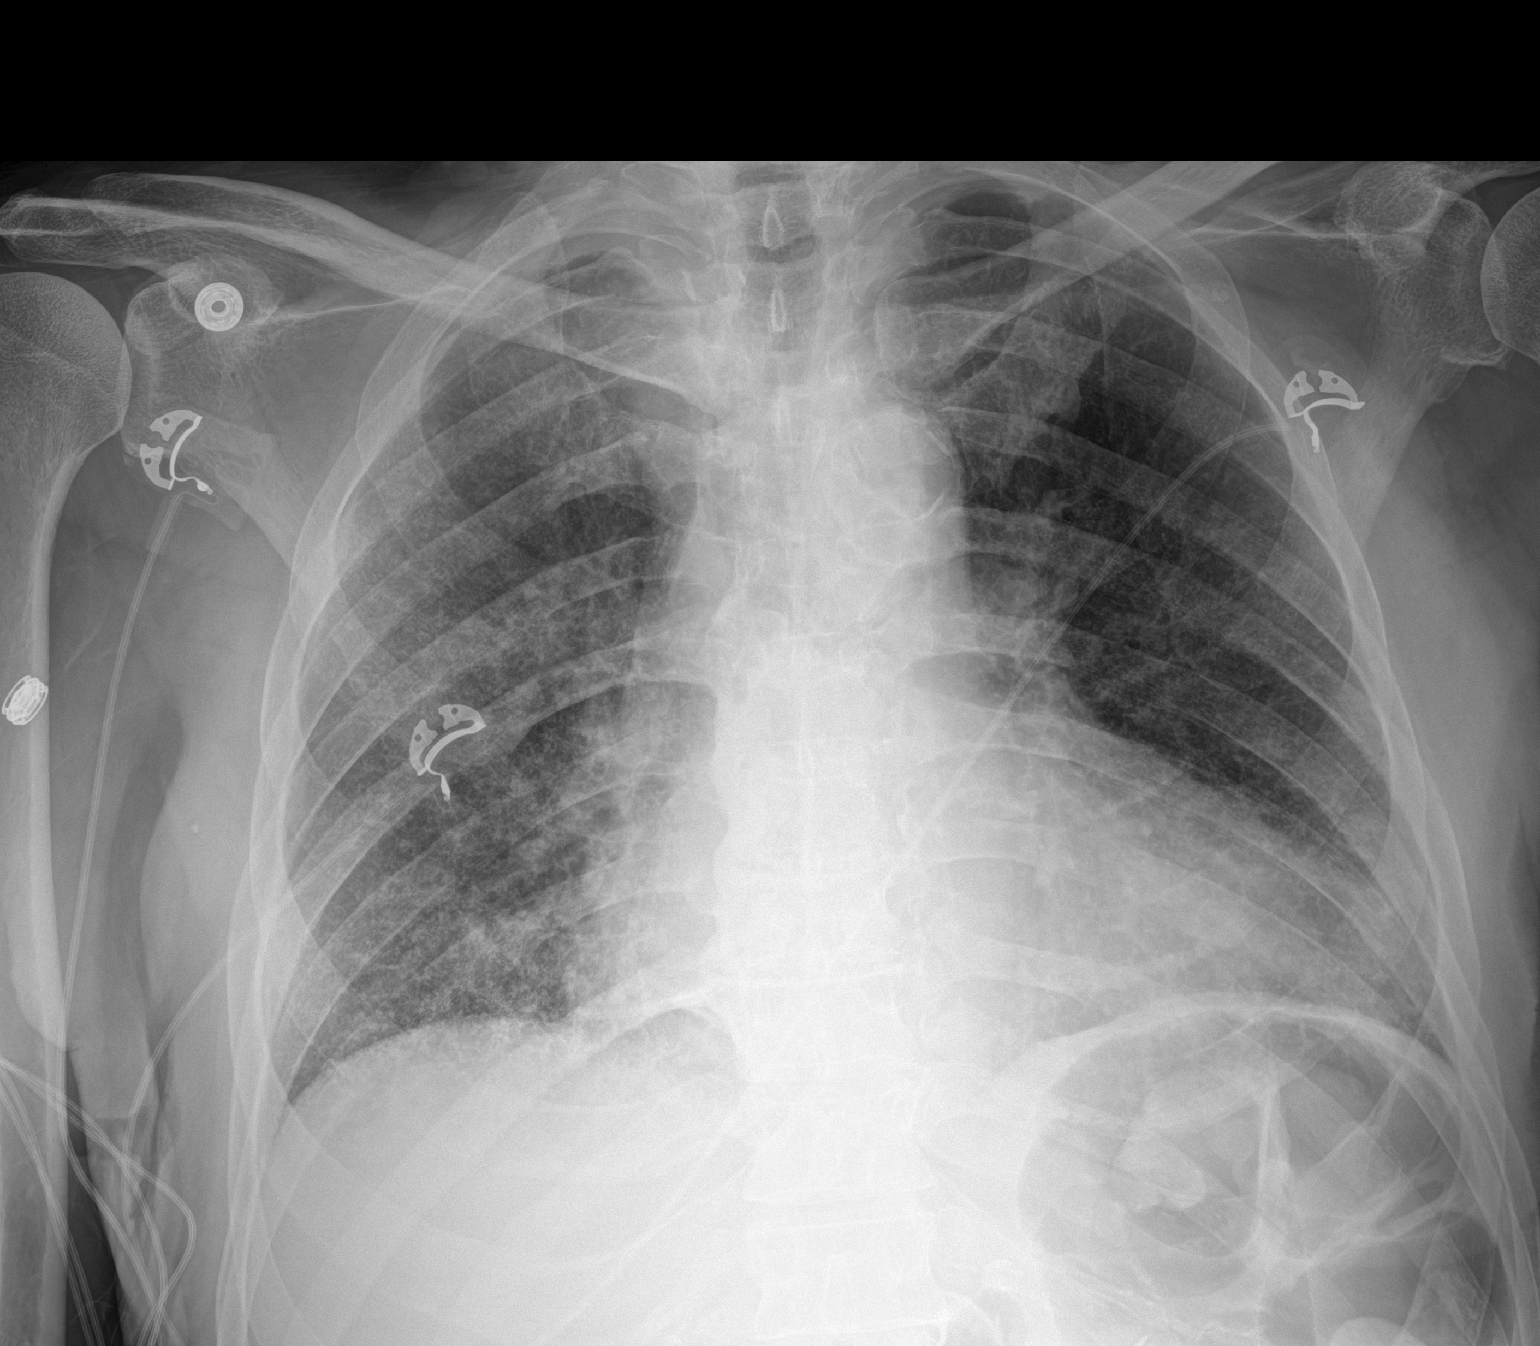

[1 of 1 positions shown; findings below may reference images not displayed]

FINDINGS: The heart size appears normal. Aortic atherosclerosis. Diffuse
increase interstitial markings within both lung the appearance is
unchanged compared with previous exam. Aortic atherosclerosis. No
airspace consolidation.
IMPRESSION: 1. Cardiac enlargement and aortic atherosclerosis.
2. Similar appearance of increased interstitial markings compatible
with pulmonary edema.
